# Patient Record
Sex: Male | Born: 1965
Health system: Southern US, Community
[De-identification: ages and names within clinical notes are randomized; demographics above are authoritative.]

## PROBLEM LIST (undated history)

## (undated) DIAGNOSIS — N390 Urinary tract infection, site not specified: Secondary | ICD-10-CM

## (undated) DIAGNOSIS — N200 Calculus of kidney: Secondary | ICD-10-CM

## (undated) DIAGNOSIS — I10 Essential (primary) hypertension: Secondary | ICD-10-CM

## (undated) DIAGNOSIS — R0602 Shortness of breath: Secondary | ICD-10-CM

## (undated) DIAGNOSIS — K439 Ventral hernia without obstruction or gangrene: Secondary | ICD-10-CM

## (undated) DIAGNOSIS — K219 Gastro-esophageal reflux disease without esophagitis: Secondary | ICD-10-CM

## (undated) DIAGNOSIS — C189 Malignant neoplasm of colon, unspecified: Secondary | ICD-10-CM

## (undated) DIAGNOSIS — C801 Malignant (primary) neoplasm, unspecified: Secondary | ICD-10-CM

## (undated) DIAGNOSIS — E119 Type 2 diabetes mellitus without complications: Secondary | ICD-10-CM

## (undated) DIAGNOSIS — E785 Hyperlipidemia, unspecified: Secondary | ICD-10-CM

## (undated) HISTORY — DX: Ventral hernia without obstruction or gangrene: K43.9

## (undated) HISTORY — PX: HERNIA REPAIR: SHX51

## (undated) HISTORY — DX: Gastro-esophageal reflux disease without esophagitis: K21.9

## (undated) HISTORY — DX: Essential (primary) hypertension: I10

## (undated) HISTORY — DX: Urinary tract infection, site not specified: N39.0

## (undated) HISTORY — DX: Malignant neoplasm of colon, unspecified: C18.9

## (undated) HISTORY — DX: Hyperlipidemia, unspecified: E78.5

## (undated) HISTORY — PX: CHOLECYSTECTOMY: SHX55

## (undated) HISTORY — DX: Malignant (primary) neoplasm, unspecified: C80.1

---

## 1997-09-09 HISTORY — PX: KIDNEY STONE SURGERY: SHX686

## 2000-01-27 ENCOUNTER — Emergency Department (HOSPITAL_COMMUNITY): Admission: EM | Admit: 2000-01-27 | Discharge: 2000-01-27 | Payer: Self-pay | Admitting: Emergency Medicine

## 2000-06-21 ENCOUNTER — Emergency Department (HOSPITAL_COMMUNITY): Admission: EM | Admit: 2000-06-21 | Discharge: 2000-06-21 | Payer: Self-pay | Admitting: Emergency Medicine

## 2000-06-21 ENCOUNTER — Encounter: Payer: Self-pay | Admitting: Emergency Medicine

## 2000-07-27 ENCOUNTER — Emergency Department (HOSPITAL_COMMUNITY): Admission: EM | Admit: 2000-07-27 | Discharge: 2000-07-27 | Payer: Self-pay | Admitting: Emergency Medicine

## 2000-08-24 ENCOUNTER — Encounter: Payer: Self-pay | Admitting: Emergency Medicine

## 2000-08-24 ENCOUNTER — Emergency Department (HOSPITAL_COMMUNITY): Admission: EM | Admit: 2000-08-24 | Discharge: 2000-08-24 | Payer: Self-pay | Admitting: Emergency Medicine

## 2000-09-25 ENCOUNTER — Emergency Department (HOSPITAL_COMMUNITY): Admission: EM | Admit: 2000-09-25 | Discharge: 2000-09-25 | Payer: Self-pay | Admitting: Emergency Medicine

## 2001-02-08 ENCOUNTER — Emergency Department (HOSPITAL_COMMUNITY): Admission: EM | Admit: 2001-02-08 | Discharge: 2001-02-08 | Payer: Self-pay | Admitting: *Deleted

## 2001-12-19 ENCOUNTER — Encounter: Payer: Self-pay | Admitting: Emergency Medicine

## 2001-12-19 ENCOUNTER — Emergency Department (HOSPITAL_COMMUNITY): Admission: EM | Admit: 2001-12-19 | Discharge: 2001-12-19 | Payer: Self-pay | Admitting: Emergency Medicine

## 2002-05-31 ENCOUNTER — Emergency Department (HOSPITAL_COMMUNITY): Admission: EM | Admit: 2002-05-31 | Discharge: 2002-05-31 | Payer: Self-pay | Admitting: Emergency Medicine

## 2003-11-06 ENCOUNTER — Emergency Department (HOSPITAL_COMMUNITY): Admission: AD | Admit: 2003-11-06 | Discharge: 2003-11-06 | Payer: Self-pay | Admitting: Family Medicine

## 2003-11-07 ENCOUNTER — Emergency Department (HOSPITAL_COMMUNITY): Admission: EM | Admit: 2003-11-07 | Discharge: 2003-11-07 | Payer: Self-pay | Admitting: Family Medicine

## 2004-07-23 ENCOUNTER — Ambulatory Visit: Payer: Self-pay | Admitting: Family Medicine

## 2004-11-16 ENCOUNTER — Ambulatory Visit: Payer: Self-pay | Admitting: Internal Medicine

## 2004-11-24 ENCOUNTER — Emergency Department (HOSPITAL_COMMUNITY): Admission: EM | Admit: 2004-11-24 | Discharge: 2004-11-24 | Payer: Self-pay | Admitting: Emergency Medicine

## 2004-12-07 ENCOUNTER — Ambulatory Visit: Payer: Self-pay | Admitting: Internal Medicine

## 2005-01-10 ENCOUNTER — Ambulatory Visit: Payer: Self-pay | Admitting: Family Medicine

## 2005-02-11 ENCOUNTER — Ambulatory Visit: Payer: Self-pay | Admitting: Family Medicine

## 2005-04-22 ENCOUNTER — Ambulatory Visit: Payer: Self-pay | Admitting: Family Medicine

## 2005-05-11 ENCOUNTER — Emergency Department (HOSPITAL_COMMUNITY): Admission: EM | Admit: 2005-05-11 | Discharge: 2005-05-11 | Payer: Self-pay | Admitting: Family Medicine

## 2005-06-11 ENCOUNTER — Ambulatory Visit: Payer: Self-pay | Admitting: Family Medicine

## 2005-07-16 ENCOUNTER — Ambulatory Visit: Payer: Self-pay | Admitting: Internal Medicine

## 2005-08-05 ENCOUNTER — Ambulatory Visit: Payer: Self-pay | Admitting: Family Medicine

## 2005-08-21 ENCOUNTER — Ambulatory Visit: Payer: Self-pay | Admitting: Family Medicine

## 2005-10-29 ENCOUNTER — Ambulatory Visit: Payer: Self-pay | Admitting: Family Medicine

## 2005-12-30 ENCOUNTER — Ambulatory Visit: Payer: Self-pay | Admitting: Family Medicine

## 2006-02-13 ENCOUNTER — Ambulatory Visit: Payer: Self-pay | Admitting: Family Medicine

## 2006-03-21 ENCOUNTER — Emergency Department (HOSPITAL_COMMUNITY): Admission: EM | Admit: 2006-03-21 | Discharge: 2006-03-21 | Payer: Self-pay | Admitting: Emergency Medicine

## 2006-03-24 ENCOUNTER — Ambulatory Visit: Payer: Self-pay | Admitting: Internal Medicine

## 2006-04-12 ENCOUNTER — Emergency Department (HOSPITAL_COMMUNITY): Admission: EM | Admit: 2006-04-12 | Discharge: 2006-04-12 | Payer: Self-pay | Admitting: Emergency Medicine

## 2006-06-11 ENCOUNTER — Ambulatory Visit: Payer: Self-pay | Admitting: Family Medicine

## 2006-07-30 ENCOUNTER — Ambulatory Visit: Payer: Self-pay | Admitting: Family Medicine

## 2006-09-16 ENCOUNTER — Ambulatory Visit: Payer: Self-pay | Admitting: Family Medicine

## 2006-11-23 ENCOUNTER — Emergency Department (HOSPITAL_COMMUNITY): Admission: EM | Admit: 2006-11-23 | Discharge: 2006-11-23 | Payer: Self-pay | Admitting: Emergency Medicine

## 2006-12-23 ENCOUNTER — Ambulatory Visit: Payer: Self-pay | Admitting: Family Medicine

## 2007-02-18 ENCOUNTER — Ambulatory Visit: Payer: Self-pay | Admitting: Family Medicine

## 2007-04-28 ENCOUNTER — Ambulatory Visit: Payer: Self-pay | Admitting: Family Medicine

## 2007-04-29 ENCOUNTER — Encounter (INDEPENDENT_AMBULATORY_CARE_PROVIDER_SITE_OTHER): Payer: Self-pay | Admitting: Family Medicine

## 2007-04-29 LAB — CONVERTED CEMR LAB
ALT: 33 units/L (ref 0–53)
AST: 22 units/L (ref 0–37)
Albumin: 4.4 g/dL (ref 3.5–5.2)
Alkaline Phosphatase: 69 units/L (ref 39–117)
BUN: 8 mg/dL (ref 6–23)
CO2: 23 meq/L (ref 19–32)
Chloride: 101 meq/L (ref 96–112)
Creatinine, Ser: 0.68 mg/dL (ref 0.40–1.50)
Glucose, Bld: 185 mg/dL — ABNORMAL HIGH (ref 70–99)
LDL Cholesterol: 72 mg/dL (ref 0–99)
Potassium: 4.3 meq/L (ref 3.5–5.3)
Sodium: 140 meq/L (ref 135–145)
Total CHOL/HDL Ratio: 4.8
Total Protein: 7.4 g/dL (ref 6.0–8.3)
VLDL: 38 mg/dL (ref 0–40)

## 2007-05-18 DIAGNOSIS — I10 Essential (primary) hypertension: Secondary | ICD-10-CM | POA: Insufficient documentation

## 2007-05-18 DIAGNOSIS — E1169 Type 2 diabetes mellitus with other specified complication: Secondary | ICD-10-CM | POA: Insufficient documentation

## 2007-05-18 DIAGNOSIS — E785 Hyperlipidemia, unspecified: Secondary | ICD-10-CM | POA: Insufficient documentation

## 2007-05-18 DIAGNOSIS — F911 Conduct disorder, childhood-onset type: Secondary | ICD-10-CM | POA: Insufficient documentation

## 2007-06-16 ENCOUNTER — Ambulatory Visit: Payer: Self-pay | Admitting: Family Medicine

## 2007-06-16 ENCOUNTER — Encounter (INDEPENDENT_AMBULATORY_CARE_PROVIDER_SITE_OTHER): Payer: Self-pay | Admitting: Internal Medicine

## 2007-07-15 ENCOUNTER — Telehealth (INDEPENDENT_AMBULATORY_CARE_PROVIDER_SITE_OTHER): Payer: Self-pay | Admitting: *Deleted

## 2007-08-10 ENCOUNTER — Telehealth (INDEPENDENT_AMBULATORY_CARE_PROVIDER_SITE_OTHER): Payer: Self-pay | Admitting: Family Medicine

## 2007-08-17 ENCOUNTER — Ambulatory Visit: Payer: Self-pay | Admitting: Family Medicine

## 2007-08-17 LAB — CONVERTED CEMR LAB
Albumin: 4.2 g/dL (ref 3.5–5.2)
Basophils Relative: 0 % (ref 0–1)
Creatinine, Ser: 0.71 mg/dL (ref 0.40–1.50)
Eosinophils Absolute: 0.1 10*3/uL — ABNORMAL LOW (ref 0.2–0.7)
HDL: 27 mg/dL — ABNORMAL LOW (ref >39)
Lymphocytes Relative: 44 % (ref 12–46)
Lymphs Abs: 2.4 10*3/uL (ref 0.7–4.0)
MCHC: 34.7 g/dL (ref 30.0–36.0)
MCV: 95.5 fL (ref 78.0–100.0)
Monocytes Relative: 5 % (ref 3–12)
Neutrophils Relative %: 48 % (ref 43–77)
RBC: 4.49 M/uL (ref 4.22–5.81)
RDW: 13 % (ref 11.5–15.5)
Total Protein: 7.3 g/dL (ref 6.0–8.3)
Triglycerides: 156 mg/dL — ABNORMAL HIGH (ref <150)

## 2007-09-08 ENCOUNTER — Telehealth (INDEPENDENT_AMBULATORY_CARE_PROVIDER_SITE_OTHER): Payer: Self-pay | Admitting: Family Medicine

## 2007-12-08 ENCOUNTER — Ambulatory Visit: Payer: Self-pay | Admitting: Family Medicine

## 2007-12-08 LAB — CONVERTED CEMR LAB: Blood Glucose, Fingerstick: 184

## 2008-03-06 ENCOUNTER — Emergency Department (HOSPITAL_COMMUNITY): Admission: EM | Admit: 2008-03-06 | Discharge: 2008-03-06 | Payer: Self-pay | Admitting: Emergency Medicine

## 2008-03-14 ENCOUNTER — Emergency Department (HOSPITAL_COMMUNITY): Admission: EM | Admit: 2008-03-14 | Discharge: 2008-03-14 | Payer: Self-pay | Admitting: Emergency Medicine

## 2008-03-16 ENCOUNTER — Ambulatory Visit: Payer: Self-pay | Admitting: Family Medicine

## 2008-03-16 DIAGNOSIS — IMO0002 Reserved for concepts with insufficient information to code with codable children: Secondary | ICD-10-CM

## 2008-03-16 LAB — CONVERTED CEMR LAB: Hgb A1c MFr Bld: 8 %

## 2008-04-08 ENCOUNTER — Emergency Department (HOSPITAL_COMMUNITY): Admission: EM | Admit: 2008-04-08 | Discharge: 2008-04-08 | Payer: Self-pay | Admitting: Emergency Medicine

## 2008-05-17 ENCOUNTER — Ambulatory Visit: Payer: Self-pay | Admitting: Family Medicine

## 2008-07-10 HISTORY — PX: COLECTOMY: SHX59

## 2008-07-18 ENCOUNTER — Inpatient Hospital Stay (HOSPITAL_COMMUNITY): Admission: EM | Admit: 2008-07-18 | Discharge: 2008-07-29 | Payer: Self-pay | Admitting: Family Medicine

## 2008-07-19 ENCOUNTER — Encounter (INDEPENDENT_AMBULATORY_CARE_PROVIDER_SITE_OTHER): Payer: Self-pay | Admitting: Family Medicine

## 2008-07-20 ENCOUNTER — Encounter (INDEPENDENT_AMBULATORY_CARE_PROVIDER_SITE_OTHER): Payer: Self-pay | Admitting: Gastroenterology

## 2008-07-22 ENCOUNTER — Encounter (INDEPENDENT_AMBULATORY_CARE_PROVIDER_SITE_OTHER): Payer: Self-pay | Admitting: General Surgery

## 2008-07-22 DIAGNOSIS — C7B8 Other secondary neuroendocrine tumors: Secondary | ICD-10-CM | POA: Insufficient documentation

## 2008-07-25 ENCOUNTER — Encounter (INDEPENDENT_AMBULATORY_CARE_PROVIDER_SITE_OTHER): Payer: Self-pay | Admitting: Family Medicine

## 2008-07-25 LAB — CONVERTED CEMR LAB
ALT: 30 units/L (ref 0–53)
AST: 17 units/L (ref 0–37)
Albumin: 4.2 g/dL (ref 3.5–5.2)
Alkaline Phosphatase: 82 units/L (ref 39–117)
BUN: 6 mg/dL (ref 6–23)
Chloride: 101 meq/L (ref 96–112)
Cholesterol: 239 mg/dL — ABNORMAL HIGH (ref 0–200)
Creatinine, Ser: 0.66 mg/dL (ref 0.40–1.50)
Glucose, Bld: 253 mg/dL — ABNORMAL HIGH (ref 70–99)
HDL: 29 mg/dL — ABNORMAL LOW (ref >39)
Potassium: 4.1 meq/L (ref 3.5–5.3)
Sodium: 138 meq/L (ref 135–145)
Total Protein: 7.4 g/dL (ref 6.0–8.3)
Triglycerides: 155 mg/dL — ABNORMAL HIGH (ref <150)

## 2008-07-27 ENCOUNTER — Ambulatory Visit: Payer: Self-pay | Admitting: Oncology

## 2008-07-28 ENCOUNTER — Telehealth (INDEPENDENT_AMBULATORY_CARE_PROVIDER_SITE_OTHER): Payer: Self-pay | Admitting: Family Medicine

## 2008-07-31 ENCOUNTER — Emergency Department (HOSPITAL_COMMUNITY): Admission: EM | Admit: 2008-07-31 | Discharge: 2008-07-31 | Payer: Self-pay | Admitting: Emergency Medicine

## 2008-08-02 ENCOUNTER — Inpatient Hospital Stay (HOSPITAL_COMMUNITY): Admission: AD | Admit: 2008-08-02 | Discharge: 2008-08-06 | Payer: Self-pay

## 2008-08-11 LAB — CMP (CANCER CENTER ONLY)
AST: 30 U/L (ref 11–38)
Alkaline Phosphatase: 86 U/L — ABNORMAL HIGH (ref 26–84)
BUN, Bld: 6 mg/dL — ABNORMAL LOW (ref 7–22)
Calcium: 9 mg/dL (ref 8.0–10.3)
Chloride: 102 mEq/L (ref 98–108)
Creat: 1.1 mg/dl (ref 0.6–1.2)
Total Bilirubin: 0.7 mg/dl (ref 0.20–1.60)

## 2008-08-11 LAB — CBC WITH DIFFERENTIAL (CANCER CENTER ONLY)
Eosinophils Absolute: 0.3 10*3/uL (ref 0.0–0.5)
HCT: 39.9 % (ref 38.7–49.9)
LYMPH%: 30.7 % (ref 14.0–48.0)
MCV: 93 fL (ref 82–98)
MONO#: 0.4 10*3/uL (ref 0.1–0.9)
NEUT%: 58.7 % (ref 40.0–80.0)
RBC: 4.27 10*6/uL (ref 4.20–5.70)
WBC: 7.2 10*3/uL (ref 4.0–10.0)

## 2008-08-16 ENCOUNTER — Ambulatory Visit: Payer: Self-pay | Admitting: Family Medicine

## 2008-08-16 LAB — CONVERTED CEMR LAB
Blood Glucose, Fingerstick: 170
Hgb A1c MFr Bld: 7.5 %

## 2008-09-07 ENCOUNTER — Encounter (INDEPENDENT_AMBULATORY_CARE_PROVIDER_SITE_OTHER): Payer: Self-pay | Admitting: Family Medicine

## 2008-09-14 ENCOUNTER — Ambulatory Visit: Payer: Self-pay | Admitting: Family Medicine

## 2008-09-14 DIAGNOSIS — B353 Tinea pedis: Secondary | ICD-10-CM

## 2008-09-22 ENCOUNTER — Ambulatory Visit: Payer: Self-pay | Admitting: Oncology

## 2008-09-27 ENCOUNTER — Encounter (INDEPENDENT_AMBULATORY_CARE_PROVIDER_SITE_OTHER): Payer: Self-pay | Admitting: Family Medicine

## 2008-09-27 LAB — CMP (CANCER CENTER ONLY)
ALT(SGPT): 17 U/L (ref 10–47)
AST: 21 U/L (ref 11–38)
Albumin: 3.5 g/dL (ref 3.3–5.5)
Alkaline Phosphatase: 64 U/L (ref 26–84)
Potassium: 3.6 mEq/L (ref 3.3–4.7)
Sodium: 135 mEq/L (ref 128–145)
Total Protein: 7.2 g/dL (ref 6.4–8.1)

## 2008-09-27 LAB — CBC WITH DIFFERENTIAL (CANCER CENTER ONLY)
BASO%: 0.7 % (ref 0.0–2.0)
EOS%: 1.8 % (ref 0.0–7.0)
HCT: 42.9 % (ref 38.7–49.9)
LYMPH%: 23.7 % (ref 14.0–48.0)
MCHC: 33.1 g/dL (ref 32.0–35.9)
MCV: 92 fL (ref 82–98)
NEUT%: 67.4 % (ref 40.0–80.0)
RDW: 12 % (ref 10.5–14.6)

## 2008-10-21 ENCOUNTER — Ambulatory Visit: Payer: Self-pay | Admitting: Internal Medicine

## 2008-10-21 DIAGNOSIS — N390 Urinary tract infection, site not specified: Secondary | ICD-10-CM

## 2008-10-21 LAB — CONVERTED CEMR LAB
Glucose, Urine, Semiquant: NEGATIVE
Hgb A1c MFr Bld: 7.3 %
Nitrite: NEGATIVE
Protein, U semiquant: NEGATIVE
pH: 6

## 2008-10-22 ENCOUNTER — Encounter (INDEPENDENT_AMBULATORY_CARE_PROVIDER_SITE_OTHER): Payer: Self-pay | Admitting: Internal Medicine

## 2008-10-24 ENCOUNTER — Ambulatory Visit: Payer: Self-pay | Admitting: Family Medicine

## 2008-10-24 LAB — CONVERTED CEMR LAB
Blood Glucose, Fingerstick: 161
Blood in Urine, dipstick: NEGATIVE
Glucose, Urine, Semiquant: NEGATIVE
Ketones, urine, test strip: NEGATIVE
Nitrite: NEGATIVE
Urobilinogen, UA: 0.2
pH: 5

## 2008-11-13 ENCOUNTER — Telehealth (INDEPENDENT_AMBULATORY_CARE_PROVIDER_SITE_OTHER): Payer: Self-pay | Admitting: Family Medicine

## 2008-11-14 ENCOUNTER — Ambulatory Visit: Payer: Self-pay | Admitting: Family Medicine

## 2008-12-23 ENCOUNTER — Ambulatory Visit: Payer: Self-pay | Admitting: Internal Medicine

## 2008-12-23 DIAGNOSIS — K432 Incisional hernia without obstruction or gangrene: Secondary | ICD-10-CM | POA: Insufficient documentation

## 2009-01-03 ENCOUNTER — Emergency Department (HOSPITAL_COMMUNITY): Admission: EM | Admit: 2009-01-03 | Discharge: 2009-01-03 | Payer: Self-pay | Admitting: Emergency Medicine

## 2009-01-06 ENCOUNTER — Encounter (INDEPENDENT_AMBULATORY_CARE_PROVIDER_SITE_OTHER): Payer: Self-pay | Admitting: Internal Medicine

## 2009-01-13 ENCOUNTER — Emergency Department (HOSPITAL_COMMUNITY): Admission: EM | Admit: 2009-01-13 | Discharge: 2009-01-13 | Payer: Self-pay | Admitting: Emergency Medicine

## 2009-01-25 ENCOUNTER — Telehealth (INDEPENDENT_AMBULATORY_CARE_PROVIDER_SITE_OTHER): Payer: Self-pay | Admitting: Family Medicine

## 2009-01-27 ENCOUNTER — Ambulatory Visit: Payer: Self-pay | Admitting: Family Medicine

## 2009-01-31 ENCOUNTER — Encounter (INDEPENDENT_AMBULATORY_CARE_PROVIDER_SITE_OTHER): Payer: Self-pay | Admitting: Family Medicine

## 2009-01-31 LAB — CONVERTED CEMR LAB
Albumin: 4.1 g/dL (ref 3.5–5.2)
Alkaline Phosphatase: 76 units/L (ref 39–117)
BUN: 8 mg/dL (ref 6–23)
CO2: 24 meq/L (ref 19–32)
Cholesterol: 127 mg/dL (ref 0–200)
Glucose, Bld: 134 mg/dL — ABNORMAL HIGH (ref 70–99)
Potassium: 4.4 meq/L (ref 3.5–5.3)
Triglycerides: 213 mg/dL — ABNORMAL HIGH (ref <150)
VLDL: 43 mg/dL — ABNORMAL HIGH (ref 0–40)

## 2009-02-10 ENCOUNTER — Encounter (INDEPENDENT_AMBULATORY_CARE_PROVIDER_SITE_OTHER): Payer: Self-pay | Admitting: Family Medicine

## 2009-02-13 ENCOUNTER — Inpatient Hospital Stay (HOSPITAL_COMMUNITY): Admission: RE | Admit: 2009-02-13 | Discharge: 2009-02-14 | Payer: Self-pay | Admitting: General Surgery

## 2009-02-14 ENCOUNTER — Encounter: Payer: Self-pay | Admitting: Physician Assistant

## 2009-02-24 ENCOUNTER — Encounter (INDEPENDENT_AMBULATORY_CARE_PROVIDER_SITE_OTHER): Payer: Self-pay | Admitting: Internal Medicine

## 2009-03-14 ENCOUNTER — Encounter: Payer: Self-pay | Admitting: Physician Assistant

## 2009-04-05 ENCOUNTER — Telehealth (INDEPENDENT_AMBULATORY_CARE_PROVIDER_SITE_OTHER): Payer: Self-pay | Admitting: Family Medicine

## 2009-04-05 ENCOUNTER — Emergency Department (HOSPITAL_COMMUNITY): Admission: EM | Admit: 2009-04-05 | Discharge: 2009-04-05 | Payer: Self-pay | Admitting: Emergency Medicine

## 2009-04-17 ENCOUNTER — Encounter (INDEPENDENT_AMBULATORY_CARE_PROVIDER_SITE_OTHER): Payer: Self-pay | Admitting: *Deleted

## 2009-04-17 ENCOUNTER — Ambulatory Visit: Payer: Self-pay | Admitting: Oncology

## 2009-04-24 LAB — CBC WITH DIFFERENTIAL (CANCER CENTER ONLY)
Eosinophils Absolute: 0.1 10*3/uL (ref 0.0–0.5)
LYMPH%: 46.9 % (ref 14.0–48.0)
MCV: 91 fL (ref 82–98)
MONO#: 0.2 10*3/uL (ref 0.1–0.9)
NEUT#: 1.4 10*3/uL — ABNORMAL LOW (ref 1.5–6.5)
Platelets: 204 10*3/uL (ref 145–400)
RBC: 4.37 10*6/uL (ref 4.20–5.70)
WBC: 3.2 10*3/uL — ABNORMAL LOW (ref 4.0–10.0)

## 2009-04-24 LAB — CMP (CANCER CENTER ONLY)
ALT(SGPT): 27 U/L (ref 10–47)
AST: 28 U/L (ref 11–38)
Calcium: 9 mg/dL (ref 8.0–10.3)
Chloride: 99 mEq/L (ref 98–108)
Creat: 0.6 mg/dl (ref 0.6–1.2)
Potassium: 3.5 mEq/L (ref 3.3–4.7)

## 2009-04-24 LAB — LACTATE DEHYDROGENASE: LDH: 120 U/L (ref 94–250)

## 2009-04-25 ENCOUNTER — Encounter: Payer: Self-pay | Admitting: Physician Assistant

## 2009-04-27 ENCOUNTER — Encounter: Payer: Self-pay | Admitting: Physician Assistant

## 2009-04-28 ENCOUNTER — Telehealth: Payer: Self-pay | Admitting: Physician Assistant

## 2009-04-28 LAB — 5 HIAA, QUANTITATIVE, URINE, 24 HOUR
5-HIAA, 24 Hr Urine: 1.1 mg/24 h (ref <=6.0)
Volume, Urine-5HIAA: 550 mL/24 h

## 2009-05-01 ENCOUNTER — Encounter: Payer: Self-pay | Admitting: Physician Assistant

## 2009-05-03 ENCOUNTER — Telehealth (INDEPENDENT_AMBULATORY_CARE_PROVIDER_SITE_OTHER): Payer: Self-pay | Admitting: Internal Medicine

## 2009-05-05 ENCOUNTER — Encounter (INDEPENDENT_AMBULATORY_CARE_PROVIDER_SITE_OTHER): Payer: Self-pay | Admitting: Internal Medicine

## 2009-05-08 ENCOUNTER — Encounter (INDEPENDENT_AMBULATORY_CARE_PROVIDER_SITE_OTHER): Payer: Self-pay | Admitting: Internal Medicine

## 2009-05-19 ENCOUNTER — Ambulatory Visit: Payer: Self-pay | Admitting: Physician Assistant

## 2009-05-19 DIAGNOSIS — R109 Unspecified abdominal pain: Secondary | ICD-10-CM | POA: Insufficient documentation

## 2009-05-19 DIAGNOSIS — Z87448 Personal history of other diseases of urinary system: Secondary | ICD-10-CM | POA: Insufficient documentation

## 2009-05-19 LAB — CONVERTED CEMR LAB: Blood Glucose, Fingerstick: 214

## 2009-05-22 ENCOUNTER — Encounter: Payer: Self-pay | Admitting: Physician Assistant

## 2009-05-22 LAB — CONVERTED CEMR LAB
ALT: 17 units/L (ref 0–53)
Albumin: 4.3 g/dL (ref 3.5–5.2)
Amylase: 23 units/L (ref 0–105)
BUN: 8 mg/dL (ref 6–23)
Chloride: 103 meq/L (ref 96–112)
Eosinophils Absolute: 0.2 10*3/uL (ref 0.0–0.7)
Eosinophils Relative: 4 % (ref 0–5)
HCT: 46.9 % (ref 39.0–52.0)
Hemoglobin: 15.7 g/dL (ref 13.0–17.0)
MCV: 94.6 fL (ref 78.0–100.0)
Neutrophils Relative %: 46 % (ref 43–77)
Platelets: 177 10*3/uL (ref 150–400)
Sodium: 138 meq/L (ref 135–145)
Total Bilirubin: 0.4 mg/dL (ref 0.3–1.2)
WBC: 5.2 10*3/uL (ref 4.0–10.5)

## 2009-05-31 DIAGNOSIS — S99919A Unspecified injury of unspecified ankle, initial encounter: Secondary | ICD-10-CM

## 2009-05-31 DIAGNOSIS — S99929A Unspecified injury of unspecified foot, initial encounter: Secondary | ICD-10-CM

## 2009-05-31 DIAGNOSIS — S8990XA Unspecified injury of unspecified lower leg, initial encounter: Secondary | ICD-10-CM | POA: Insufficient documentation

## 2009-06-02 ENCOUNTER — Ambulatory Visit: Payer: Self-pay | Admitting: Physician Assistant

## 2009-06-05 ENCOUNTER — Encounter: Payer: Self-pay | Admitting: Physician Assistant

## 2009-06-05 ENCOUNTER — Ambulatory Visit (HOSPITAL_COMMUNITY): Admission: RE | Admit: 2009-06-05 | Discharge: 2009-06-05 | Payer: Self-pay | Admitting: Physician Assistant

## 2009-06-06 ENCOUNTER — Telehealth: Payer: Self-pay | Admitting: Physician Assistant

## 2009-09-07 ENCOUNTER — Telehealth: Payer: Self-pay | Admitting: Physician Assistant

## 2009-09-07 ENCOUNTER — Ambulatory Visit: Payer: Self-pay | Admitting: Physician Assistant

## 2009-09-07 DIAGNOSIS — K219 Gastro-esophageal reflux disease without esophagitis: Secondary | ICD-10-CM

## 2009-09-07 LAB — CONVERTED CEMR LAB
Bilirubin Urine: NEGATIVE
Blood in Urine, dipstick: NEGATIVE
Glucose, Urine, Semiquant: >=1000
Nitrite: NEGATIVE
Specific Gravity, Urine: 1.025
Urobilinogen, UA: 0.2
WBC Urine, dipstick: NEGATIVE

## 2009-09-12 ENCOUNTER — Encounter: Payer: Self-pay | Admitting: Physician Assistant

## 2009-09-12 LAB — CONVERTED CEMR LAB
ALT: 47 units/L (ref 0–53)
AST: 35 units/L (ref 0–37)
Albumin: 4.3 g/dL (ref 3.5–5.2)
BUN: 7 mg/dL (ref 6–23)
Chloride: 99 meq/L (ref 96–112)
Glucose, Bld: 286 mg/dL — ABNORMAL HIGH (ref 70–99)
Potassium: 3.9 meq/L (ref 3.5–5.3)
Total Bilirubin: 0.7 mg/dL (ref 0.3–1.2)
Total Protein: 7.2 g/dL (ref 6.0–8.3)

## 2009-09-15 ENCOUNTER — Telehealth: Payer: Self-pay | Admitting: Physician Assistant

## 2009-10-17 ENCOUNTER — Ambulatory Visit: Payer: Self-pay | Admitting: Physician Assistant

## 2009-10-23 ENCOUNTER — Ambulatory Visit: Payer: Self-pay | Admitting: Oncology

## 2009-10-26 LAB — CBC WITH DIFFERENTIAL (CANCER CENTER ONLY)
BASO#: 0 10*3/uL (ref 0.0–0.2)
EOS%: 4.5 % (ref 0.0–7.0)
HCT: 46.5 % (ref 38.7–49.9)
HGB: 15.7 g/dL (ref 13.0–17.1)
LYMPH#: 1.9 10*3/uL (ref 0.9–3.3)
MCH: 34.1 pg — ABNORMAL HIGH (ref 28.0–33.4)
MCHC: 33.7 g/dL (ref 32.0–35.9)
MCV: 101 fL — ABNORMAL HIGH (ref 82–98)
NEUT%: 44 % (ref 40.0–80.0)

## 2009-10-26 LAB — CMP (CANCER CENTER ONLY)
AST: 32 U/L (ref 11–38)
BUN, Bld: 10 mg/dL (ref 7–22)
Calcium: 9 mg/dL (ref 8.0–10.3)
Chloride: 100 mEq/L (ref 98–108)
Creat: 0.7 mg/dl (ref 0.6–1.2)

## 2009-10-26 LAB — LACTATE DEHYDROGENASE: LDH: 127 U/L (ref 94–250)

## 2009-11-02 LAB — 5 HIAA, QUANTITATIVE, URINE, 24 HOUR: Volume, Urine-5HIAA: 800 mL/24 h

## 2009-11-07 ENCOUNTER — Ambulatory Visit: Payer: Self-pay | Admitting: Physician Assistant

## 2009-11-09 LAB — CONVERTED CEMR LAB
HDL: 26 mg/dL — ABNORMAL LOW (ref >39)
Total CHOL/HDL Ratio: 8.5
Triglycerides: 258 mg/dL — ABNORMAL HIGH (ref <150)
VLDL: 52 mg/dL — ABNORMAL HIGH (ref 0–40)

## 2009-11-14 ENCOUNTER — Ambulatory Visit: Payer: Self-pay | Admitting: Physician Assistant

## 2010-03-19 ENCOUNTER — Ambulatory Visit: Payer: Self-pay | Admitting: Physician Assistant

## 2010-03-19 DIAGNOSIS — S6390XA Sprain of unspecified part of unspecified wrist and hand, initial encounter: Secondary | ICD-10-CM | POA: Insufficient documentation

## 2010-03-20 LAB — CONVERTED CEMR LAB
BUN: 7 mg/dL (ref 6–23)
CO2: 20 meq/L (ref 19–32)
Calcium: 10.1 mg/dL (ref 8.4–10.5)
Chloride: 100 meq/L (ref 96–112)
Creatinine, Ser: 0.75 mg/dL (ref 0.40–1.50)
Glucose, Bld: 315 mg/dL — ABNORMAL HIGH (ref 70–99)
Hgb A1c MFr Bld: 9.4 % — ABNORMAL HIGH (ref <5.7)
Total Bilirubin: 0.6 mg/dL (ref 0.3–1.2)

## 2010-03-27 ENCOUNTER — Ambulatory Visit: Payer: Self-pay | Admitting: Physician Assistant

## 2010-03-27 ENCOUNTER — Encounter: Payer: Self-pay | Admitting: Physician Assistant

## 2010-04-04 ENCOUNTER — Ambulatory Visit: Payer: Self-pay | Admitting: Physician Assistant

## 2010-04-05 LAB — CONVERTED CEMR LAB
Cholesterol: 104 mg/dL (ref 0–200)
HDL: 29 mg/dL — ABNORMAL LOW (ref >39)
Triglycerides: 125 mg/dL (ref <150)

## 2010-04-13 ENCOUNTER — Ambulatory Visit: Payer: Self-pay | Admitting: Oncology

## 2010-05-18 ENCOUNTER — Emergency Department (HOSPITAL_COMMUNITY)
Admission: EM | Admit: 2010-05-18 | Discharge: 2010-05-18 | Payer: Self-pay | Source: Home / Self Care | Admitting: Emergency Medicine

## 2010-08-14 ENCOUNTER — Ambulatory Visit: Payer: Self-pay | Admitting: Oncology

## 2010-08-15 LAB — CBC WITH DIFFERENTIAL/PLATELET
BASO%: 0.6 % (ref 0.0–2.0)
HCT: 42.6 % (ref 38.4–49.9)
MCHC: 35.4 g/dL (ref 32.0–36.0)
MONO#: 0.3 10*3/uL (ref 0.1–0.9)
RBC: 4.26 10*6/uL (ref 4.20–5.82)
RDW: 13.6 % (ref 11.0–14.6)
WBC: 4.9 10*3/uL (ref 4.0–10.3)
lymph#: 2.1 10*3/uL (ref 0.9–3.3)

## 2010-08-15 LAB — COMPREHENSIVE METABOLIC PANEL
ALT: 37 U/L (ref 0–53)
CO2: 26 mEq/L (ref 19–32)
Calcium: 9.6 mg/dL (ref 8.4–10.5)
Chloride: 106 mEq/L (ref 96–112)
Sodium: 141 mEq/L (ref 135–145)
Total Protein: 6.9 g/dL (ref 6.0–8.3)

## 2010-08-23 ENCOUNTER — Ambulatory Visit (HOSPITAL_COMMUNITY): Admission: RE | Admit: 2010-08-23 | Payer: Self-pay | Source: Home / Self Care | Admitting: Oncology

## 2010-09-04 ENCOUNTER — Ambulatory Visit (HOSPITAL_COMMUNITY)
Admission: RE | Admit: 2010-09-04 | Discharge: 2010-09-04 | Payer: Self-pay | Source: Home / Self Care | Attending: Oncology | Admitting: Oncology

## 2010-09-18 ENCOUNTER — Ambulatory Visit: Admit: 2010-09-18 | Payer: Self-pay | Admitting: Internal Medicine

## 2010-09-30 ENCOUNTER — Encounter: Payer: Self-pay | Admitting: Oncology

## 2010-10-11 NOTE — Letter (Signed)
Summary: NUTRITION SUMMARY  NUTRITION SUMMARY   Imported By: Arta Bruce 04/23/2010 14:57:31  _____________________________________________________________________  External Attachment:    Type:   Image     Comment:   External Document

## 2010-10-11 NOTE — Assessment & Plan Note (Signed)
Summary: follow up in 1 month with [name] for dm///gk   Vital Signs:  Patient profile:   45 year old male Height:      65.5 inches Weight:      257 pounds BMI:     42.27 Temp:     98.3 degrees F oral Pulse rate:   76 / minute Pulse rhythm:   regular Resp:     18 per minute BP sitting:   135 / 83  (left arm) Cuff size:   large  Vitals Entered By: Armenia Shannon (October 17, 2009 2:43 PM) CC: dm check up...[name] pt needs refills on meds..., Hypertension Management Is Patient Diabetic? Yes Pain Assessment Patient in pain? no      CBG Result 267  Does patient need assistance? Functional Status Self care Ambulation Normal   Primary Care Provider:  Tereso Newcomer PA-C  CC:  dm check up...[name] pt needs refills on meds... and Hypertension Management.  History of Present Illness: Here for f/u on DM. OUt of metformin.  Not clear as to how long he has been out.   Still taking lantus. Notes he checks his sugar at night when his mom gets home.  He has had most numbers in the mid 200 range.  He did have one reading of 500 2 nights ago.   No polyuria, polydipsia, polyphagia. No nausea or vomiting.  No stomach pain. Not taking PPI for GERD.  No recent symptoms of acid reflux.  No melena or hematochezia.    Hypertension History:      He denies chest pain, dyspnea with exertion, and syncope.  Further comments include: out of meds .        Positive major cardiovascular risk factors include diabetes, hyperlipidemia, and hypertension.  Negative major cardiovascular risk factors include male age less than 48 years old, negative family history for ischemic heart disease, and non-tobacco-user status.      Problems Prior to Update: 1)  Gerd  (ICD-530.81) 2)  Ankle Injury, Left  (ICD-959.7) 3)  Hematuria, Hx of  (ICD-V13.09) 4)  Abdominal Pain  (ICD-789.00) 5)  Ventral Hernia, Incisional  (ICD-553.21) 6)  Urinary Tract Infection, Acute  (ICD-599.0) 7)  Tinea Pedis  (ICD-110.4) 8)  Secondary  Neuroendocrine Tumor Unspecified Site  (ICD-209.70) 9)  Abrasion  (ICD-919.0) 10)  Anger  (ICD-312.00) 11)  Hypertension  (ICD-401.9) 12)  Hyperlipidemia  (ICD-272.4) 13)  Diabetes Mellitus, Type II  (ICD-250.00)  Allergies: No Known Drug Allergies  Past History:  Past Medical History: Last updated: 05/19/2009 Current Problems:  SECONDARY NEUROENDOCRINE TUMOR UNSPECIFIED SITE (ICD-209.70)...Nov.2009    carcinoid; s/p partial right colectomy Dr. Bertram Savin    followed by Dr. Welton Flakes (Cancer center in [place]) ABRASION (ICD-919.0) ANGER (ICD-312.00) HYPERTENSION (ICD-401.9) HYPERLIPIDEMIA (ICD-272.4) DIABETES MELLITUS, TYPE II (ICD-250.00)  Past Surgical History: Last updated: 02/24/2009 1.  Cholecystectomy; 1993 in [place] 2.  s/p right colon resection of ileocecal mass..07/26/2008. 3.  02/10/2009:  Laparoscopic Incisional Hernia Repair--Dr. Bertram Savin  Physical Exam  General:  alert, well-developed, and well-nourished.   Head:  normocephalic and atraumatic.   Neck:  supple.   Lungs:  normal breath sounds, no crackles, and no wheezes.   Heart:  normal rate, regular rhythm, and no murmur.   Abdomen:  soft, non-tender, normal bowel sounds, and no hepatomegaly.   Neurologic:  alert & oriented X3 and cranial nerves II-XII intact.   Psych:  normally interactive.     Impression & Recommendations:  Problem # 1:  DIABETES  MELLITUS, TYPE II (ICD-250.00) out of control due to noncompliance restart metformin increase Lantus to 15 units at bedtime close f/u  His updated medication list for this problem includes:    Lisinopril 10 Mg Tabs (Lisinopril) .[name]... Take 1 tab by mouth daily    Glucophage 500 Mg Tabs (Metformin hcl) .[name]... Take 1 tablet by mouth every 12 hours    Lantus Solostar 100 Unit/ml Soln (Insulin glargine) ..... Inject 15 unit at bedtime  Problem # 2:  HYPERLIPIDEMIA (ICD-272.4) schedule fasting lipids  His updated medication list for this problem  includes:    Crestor 20 Mg Tabs (Rosuvastatin calcium) ..... One tablet by mouth nightly for cholesterol  Problem # 3:  HYPERTENSION (ICD-401.9) up today 2/2 being out of meds  His updated medication list for this problem includes:    Lisinopril 10 Mg Tabs (Lisinopril) .[name]... Take 1 tab by mouth daily  Problem # 4:  GERD (ICD-530.81) fairly stable without PPI change to H2RA to use as needed  His updated medication list for this problem includes:    Pepcid 20 Mg Tabs (Famotidine) .[name]... Take 1 tablet by mouth two times a day as needed for indigestion  Complete Medication List: 1)  Crestor 20 Mg Tabs (Rosuvastatin calcium) .... One tablet by mouth nightly for cholesterol 2)  Lisinopril 10 Mg Tabs (Lisinopril) .... Take 1 tab by mouth daily 3)  Glucophage 500 Mg Tabs (Metformin hcl) .... Take 1 tablet by mouth every 12 hours 4)  Pepcid 20 Mg Tabs (Famotidine) .... Take 1 tablet by mouth two times a day as needed for indigestion 5)  Air Cast Ankle Strap Splint  .... Apply to left ankle while active for 2-4 weeks, until ankle pain improves 6)  Lantus Solostar 100 Unit/ml Soln (Insulin glargine) .... Inject 15 unit at bedtime 7)  Lantus Solostar Pen Needles  .... As directed  Hypertension Assessment/Plan:      The patient's hypertensive risk group is category C: Target organ damage and/or diabetes.  His calculated 10 year risk of coronary heart disease is 7 %.  Today's blood pressure is 135/83.  His blood pressure goal is < 130/80.  Patient Instructions: 1)  Record sugars every morning. 2)  Bring a list of your sugars to your next appointment. 3)  Restart the glucophage (metformin). 4)  Increase Lantus to 15 units at bedtime. 5)  Please schedule a follow-up appointment in 1 month with [name] for diabetes.  6)  Come in fasting one day before your next appointment for labs (Lipids; dx 272.4). Prescriptions: PEPCID 20 MG TABS (FAMOTIDINE) Take 1 tablet by mouth two times a day as needed for  indigestion  #60 x 3   Entered and Authorized by:   Tereso Newcomer PA-C   Signed by:   Tereso Newcomer PA-C on 10/17/2009   Method used:   Electronically to        [facility] [phone]* (retail)       [address]       [place], [place]  [address]       Ph: [phone]       Fax: [phone]   RxID:   [id number] GLUCOPHAGE 500 MG TABS (METFORMIN HCL) Take 1 tablet by mouth every 12 hours  #60 x 11   Entered and Authorized by:   Tereso Newcomer PA-C   Signed by:   Tereso Newcomer PA-C on 10/17/2009   Method used:   Electronically to  [facility] [address] [phone]* (retail)       [address]       [place], [place]  [address]       Ph: [phone]       Fax: [phone]   RxID:   [id number] LISINOPRIL 10 MG  TABS (LISINOPRIL) Take 1 tab by mouth daily  #30 x 6   Entered and Authorized by:   Tereso Newcomer PA-C   Signed by:   Tereso Newcomer PA-C on 10/17/2009   Method used:   Electronically to        [facility] [phone]* (retail)       [address]       [place], [place]  [address]       Ph: [phone]       Fax: [phone]   RxID:   [id number]   Appended Document: follow up in 1 month with Lorin Picket for dm///gk Patient: [name] Note: All result statuses are Final unless otherwise noted.  Tests: (1) Lipid Profile ([address])   Order Note: FASTING   Cholesterol          [H]  221 mg/dL                   6-237     ATP III Classification:           < 200        mg/dL        Desirable          200 - 239     mg/dL        Borderline High          >= 240        mg/dL        High         Triglyceride         [H]  258 mg/dL                   <628   HDL Cholesterol      [L]  26 mg/dL                    >31   Total Chol/HDL Ratio      8.5 Ratio  VLDL Cholesterol (Calc)                        [H]  52 mg/dL                    5-17  LDL Cholesterol (Calc)                        [H]  143 mg/dL                   6-16           Total Cholesterol/HDL Ratio:CHD Risk                             Coronary Heart Disease Risk Table                                            Men       Women  1/2 Average Risk              3.4        3.3                  Average Risk              5.0        4.4              2 X Average Risk              9.6        7.1              3 X Average Risk             23.4       11.0     Use the calculated Patient Ratio above and the CHD Risk table      to determine the patient's CHD Risk.     ATP III Classification (LDL):           < 100        mg/dL         Optimal          100 - 129     mg/dL         Near or Above Optimal          130 - 159     mg/dL         Borderline High          160 - 189     mg/dL         High           > 190        mg/dL         Very High        Note: An exclamation mark (!) indicates a result that was not dispersed into the flowsheet. Document Creation Date: 11/07/2009 11:53 PM _______________________________________________________________________  (1) Order result status: Final Collection or observation date-time: 11/07/2009 21:46 Requested date-time: 11/07/2009 08:57 Receipt date-time: 11/07/2009 21:46 Reported date-time: 11/07/2009 23:49 Referring Physician:   Ordering Physician:  Alben Spittle [id number]) Specimen Source:  Source: Lajean Silvius Order Number: [id number] Lab site: [facility]     [address], [address]     [place]  [place]  [address]   Signed by Tereso Newcomer PA-C on 11/09/2009 at 8:57 AM  ________________________________________________________________________ supposed to be on crestor 20 by labs, does not appear that he is taking has appt next week will d/w patient then and decide on +/- adjustment in Rx.    Signed by Tereso Newcomer PA-C on 11/09/2009 at 8:57 AM

## 2010-10-11 NOTE — Letter (Signed)
Summary: Discharge Summary  Discharge Summary   Imported By: Arta Bruce 01/30/2010 15:55:08  _____________________________________________________________________  External Attachment:    Type:   Image     Comment:   External Document

## 2010-10-11 NOTE — Progress Notes (Signed)
  Phone Note Outgoing Call   Summary of Call: got prior auth for his lantus lantus on preferred list please find out what lantus I need to write for  Initial call taken by: Brynda Rim,  September 15, 2009 5:41 PM  Follow-up for Phone Call        not understanding...Marland KitchenMarland Kitchen solostar it that what you wanted or you need to know something else.... Follow-up by: Armenia Shannon,  September 19, 2009 12:38 PM  Additional Follow-up for Phone Call Additional follow up Details #1::        please call medicaid to find out if we can get prior auth for his Lantus Solostar. it is on preferred list and not sure why getting PA request. Additional Follow-up by: Brynda Rim,  September 19, 2009 2:08 PM    Additional Follow-up for Phone Call Additional follow up Details #2::    MS Derrington DAVIDS MOM STOPPED BY AND SAYS THAT Nathanal GOT THE LANTUS PENS, BUT THEY DIDNT GIVE HIM THE NEEDLES FOR HIS PENS. HE USES [facility] ON [address] NOW. Follow-up by: Leodis Rains,  September 20, 2009 12:25 PM  New/Updated Medications: * LANTUS SOLOSTAR PEN NEEDLES as directed Prescriptions: LANTUS SOLOSTAR PEN NEEDLES as directed  #1 mo supply x 11   Entered and Authorized by:   Tereso Newcomer PA-C   Signed by:   Tereso Newcomer PA-C on 09/20/2009   Method used:   Print then Give to Patient   RxID:   [id number]

## 2010-10-11 NOTE — Letter (Signed)
Summary: External Correspondence  External Correspondence   Imported By: Arta Bruce 01/30/2010 15:56:28  _____________________________________________________________________  External Attachment:    Type:   Image     Comment:   External Document

## 2010-10-11 NOTE — Letter (Signed)
Summary: *HSN Results Follow up  [facility]  [address] [place], [place] [address]   Phone: [phone]  Fax: [phone]      09/12/2009   Keithon Melchor Amour [address] [place], [place]  [address]   Dear  Mr. Brayten Monjaraz,                            ____S.Drinkard,FNP   ____D. Gore,FNP       ____B. McPherson,MD   ____V. Rankins,MD    ____E. Mulberry,MD    ____N. Daphine Deutscher, FNP  ____D. Reche Dixon, MD    ____K. Philipp Deputy, MD    __x__S. Alben Spittle, PA-C     This letter is to inform you that your recent test(s):  _______Pap Smear    ___x____Lab Test     _______X-ray    _______ is within acceptable limits  _______ requires a medication change  _______ requires a follow-up lab visit  _______ requires a follow-up visit with your provider   Comments:  Your labs looked ok except for your sugar, as expected.  We will see how your numbers do with the Lantus.       _________________________________________________________ If you have any questions, please contact our office                     Sincerely,  Tereso Newcomer PA-C [facility]

## 2010-10-11 NOTE — Assessment & Plan Note (Signed)
Summary: FU ON DM/////KT   Vital Signs:  Patient profile:   45 year old male Height:      65. inches Weight:      259 pounds BMI:     43.26 Temp:     97.5 degrees F oral Pulse rate:   80 / minute Pulse rhythm:   regular Resp:     18 per minute BP sitting:   117 / 77  (left arm) Cuff size:   large  Vitals Entered By: Armenia Shannon (March 19, 2010 10:25 AM) CC: f/u.... pt says wants to check his left hand middle finger, Hypertension Management Is Patient Diabetic? Yes Pain Assessment Patient in pain? no      CBG Result 287  Does patient need assistance? Functional Status Self care Ambulation Normal   Primary Care Provider:  Tereso Newcomer PA-C  CC:  f/u.... pt says wants to check his left hand middle finger and Hypertension Management.  History of Present Illness: Here for f/u on diabetes. Admits to compliance with meds.  Taking metformin 1000 mg two times a day and takes Lantus 30 units at bedtime.  Sugar 287 today.  Ate sausage and cheese biscuit.  Drinking regular pepsi in the office as well.  Has not seen dietician.  Admits to drinking sweetened tea most of the time.  Sugars ranging 120s.  States he is checking before he eats.  Saw eye doctor in June.    Left middle finger pain:  Notes he was rough housing with some kids at his house few days ago.  Developed middle finger pain.  No overt swelling.  No loss of function.  Did fall getting out of car yesterday.  No change in symptoms.    Hypertension History:      He denies headache, chest pain, and dyspnea with exertion.        Positive major cardiovascular risk factors include diabetes, hyperlipidemia, and hypertension.  Negative major cardiovascular risk factors include male age less than 46 years old, negative family history for ischemic heart disease, and non-tobacco-user status.     Current Medications (verified): 1)  Crestor 20 Mg  Tabs (Rosuvastatin Calcium) .... One Tablet By Mouth Nightly For Cholesterol 2)   Lisinopril 10 Mg  Tabs (Lisinopril) .... Take 1 Tab By Mouth Daily 3)  Glucophage 1000 Mg Tabs (Metformin Hcl) .... Take 1 Tablet By Mouth Two Times A Day 4)  Pepcid 20 Mg Tabs (Famotidine) .... Take 1 Tablet By Mouth Two Times A Day As Needed For Indigestion 5)  Air Cast Ankle Strap Splint .... Apply To Left Ankle While Active For 2-4 Weeks, Until Ankle Pain Improves 6)  Lantus Solostar 100 Unit/ml Soln (Insulin Glargine) .... Inject 20 Unit At Bedtime 7)  Lantus Solostar Pen Needles .... As Directed  Allergies (verified): No Known Drug Allergies  Social History: Reviewed history from 08/16/2008 and no changes required. Lives with mother. 1 daughter who lives in another state and the mother has a restraining order against patient. Never Smoked Alcohol use-no Drug use-no Learning disabled.  Physical Exam  General:  alert, well-developed, and well-nourished.   Head:  normocephalic and atraumatic.   Lungs:  normal breath sounds.   Heart:  normal rate and regular rhythm.   Abdomen:  soft and no hepatomegaly.   Msk:  bilat feet without callus or ulcers toes with exaggerated dorsiflexion bilat  left hand: middle finger without deformity all tendons intact warm and dry no pain over joint spaces or shafts  Pulses:  cap refill in left middle finger wnl Extremities:  no edema Neurologic:  alert & oriented X3 and cranial nerves II-XII intact.   Psych:  normally interactive.     Impression & Recommendations:  Problem # 1:  DIABETES MELLITUS, TYPE II (ICD-250.00)  sugar today is uncontrolled readings at home sound better by his description get A1C refer back to dietician as I think his diet is very poor   His updated medication list for this problem includes:    Lisinopril 10 Mg Tabs (Lisinopril) .[name]... Take 1 tab by mouth daily    Glucophage 1000 Mg Tabs (Metformin hcl) .[name]... Take 1 tablet by mouth two times a day    Lantus Solostar 100 Unit/ml Soln (Insulin glargine) .....  Inject 30 unit at bedtime  Orders: T-Comprehensive Metabolic Panel [id number]) T- Hemoglobin A1C (28413-24401) Capillary Blood Glucose/CBG (02725)  Problem # 2:  HYPERTENSION (ICD-401.9)  controlled  His updated medication list for this problem includes:    Lisinopril 10 Mg Tabs (Lisinopril) .[name]... Take 1 tab by mouth daily  Orders: T-Comprehensive Metabolic Panel ([id number])  Problem # 3:  HYPERLIPIDEMIA (ICD-272.4)  will bring back for FLP  His updated medication list for this problem includes:    Crestor 20 Mg Tabs (Rosuvastatin calcium) ..... One tablet by mouth nightly for cholesterol  Orders: T-Comprehensive Metabolic Panel ([id number])  Problem # 4:  SECONDARY NEUROENDOCRINE TUMOR UNSPECIFIED SITE (ICD-209.70) due for f/u with oncology in August  Problem # 5:  FINGER SPRAIN (ICD-842.10) exam normal . . . do not feel that xray needed at this time buddy tape ice NSAIDs as needed f/u as needed if no improvement or worse over next several days, patient advised to call and we can set up xray  Complete Medication List: 1)  Crestor 20 Mg Tabs (Rosuvastatin calcium) .... One tablet by mouth nightly for cholesterol 2)  Lisinopril 10 Mg Tabs (Lisinopril) .... Take 1 tab by mouth daily 3)  Glucophage 1000 Mg Tabs (Metformin hcl) .... Take 1 tablet by mouth two times a day 4)  Pepcid 20 Mg Tabs (Famotidine) .... Take 1 tablet by mouth two times a day as needed for indigestion 5)  Air Cast Ankle Strap Splint  .... Apply to left ankle while active for 2-4 weeks, until ankle pain improves 6)  Lantus Solostar 100 Unit/ml Soln (Insulin glargine) .... Inject 30 unit at bedtime 7)  Lantus Solostar Pen Needles  .... As directed  Hypertension Assessment/Plan:      The patient's hypertensive risk group is category C: Target organ damage and/or diabetes.  His calculated 10 year risk of coronary heart disease is 11 %.  Today's blood pressure is 117/77.  His blood pressure goal is <  130/80.  Patient Instructions: 1)  Schedule appointment with Drucilla Schmidt for diet education.  Request mother come with him to the appointment. 2)  Schedule fasting lipids with lab in next 1-2 weeks.  Nothing to eat or drink after midnight except water. 3)  Buddy tape left middle finger for 3-4 days. 4)  Ice left middle finger two times a day for 2-3 days. 5)  Flex hand at least once a day. 6)  If pain no better or worse by end of week, call us. 7)  Please schedule a follow-up appointment in 3 months with Alaena Strader for diabetes.  8)  Take 400-600 mg of Ibuprofen (Advil, Motrin) with food every 4-6 hours as needed  for relief of pain or comfort of fever.

## 2010-10-11 NOTE — Assessment & Plan Note (Signed)
Summary: Patient never seen . . . did not bring meds.   Allergies: No Known Drug Allergies   Complete Medication List: 1)  Crestor 20 Mg Tabs (Rosuvastatin calcium) .... One tablet by mouth nightly for cholesterol 2)  Lisinopril 10 Mg Tabs (Lisinopril) .... Take 1 tab by mouth daily 3)  Glucophage 1000 Mg Tabs (Metformin hcl) .... Take 1 tablet by mouth two times a day 4)  Pepcid 20 Mg Tabs (Famotidine) .... Take 1 tablet by mouth two times a day as needed for indigestion 5)  Air Cast Ankle Strap Splint  .... Apply to left ankle while active for 2-4 weeks, until ankle pain improves 6)  Lantus Solostar 100 Unit/ml Soln (Insulin glargine) .... Inject 30 unit at bedtime 7)  Lantus Solostar Pen Needles  .... As directed

## 2010-10-11 NOTE — Assessment & Plan Note (Signed)
Summary: FOLLOW UP WITH [name] FOR DM IN ONE MONTH//GK   Vital Signs:  Patient profile:   45 year old male Height:      65.5 inches Weight:      265 pounds BMI:     43.58 Temp:     98.0 degrees F oral Pulse rate:   82 / minute Pulse rhythm:   regular Resp:     20 per minute BP sitting:   130 / 86  (left arm) Cuff size:   large  Vitals Entered By: Arm[place] Shannon (November 14, 2009 2:11 PM) CC: f/u one month dm.... , Hypertension Management Is Patient Diabetic? Yes Pain Assessment Patient in pain? no      CBG Result 320  Does patient need assistance? Functional Status Self care Ambulation Normal   Primary Care Provider:  Tereso Newcomer PA-C  CC:  f/u one month dm....  and Hypertension Management.  History of Present Illness: Here for f/u. Sugars high still. Brings in list of sugars in Feb.  Avg number around 250.  He says he is taking his metformin, but he still has several tablets in a bottle that was filled 10/17/2009.  He states he is taking his Lantus each night.  His mom puts his meds out for him.  He is supposed to be on lisinopril but does not have that with him today.  It is not clear if he is taking or not.  He is supposed to be on Crestor.  The bottle is empty.  It was filled in 03/2009.  His recent lipid panel had an LDL of 140+.  It appears he is noncompliant with all of his medicines.  Says he is under a lot of stress.  Feels depressed.  No suicidal ideations.    Hypertension History:      He denies chest pain, dyspnea with exertion, and syncope.        Positive major cardiovascular risk factors include diabetes, hyperlipidemia, and hypertension.  Negative major cardiovascular risk factors include male age less than 10 years old, negative family history for ischemic heart disease, and non-tobacco-user status.     Problems Prior to Update: 1)  Family Stress  (ICD-V61.9) 2)  Gerd  (ICD-530.81) 3)  Ankle Injury, Left  (ICD-959.7) 4)  Hematuria, Hx of  (ICD-V13.09) 5)   Abdominal Pain  (ICD-789.00) 6)  Ventral Hernia, Incisional  (ICD-553.21) 7)  Urinary Tract Infection, Acute  (ICD-599.0) 8)  Tinea Pedis  (ICD-110.4) 9)  Secondary Neuroendocrine Tumor Unspecified Site  (ICD-209.70) 10)  Abrasion  (ICD-919.0) 11)  Anger  (ICD-312.00) 12)  Hypertension  (ICD-401.9) 13)  Hyperlipidemia  (ICD-272.4) 14)  Diabetes Mellitus, Type II  (ICD-250.00)  Current Medications (verified): 1)  Crestor 20 Mg  Tabs (Rosuvastatin Calcium) .... One Tablet By Mouth Nightly For Cholesterol 2)  Lisinopril 10 Mg  Tabs (Lisinopril) .... Take 1 Tab By Mouth Daily 3)  Glucophage 500 Mg Tabs (Metformin Hcl) .... Take 1 Tablet By Mouth Every 12 Hours 4)  Pepcid 20 Mg Tabs (Famotidine) .... Take 1 Tablet By Mouth Two Times A Day As Needed For Indigestion 5)  Air Cast Ankle Strap Splint .... Apply To Left Ankle While Active For 2-4 Weeks, Until Ankle Pain Improves 6)  Lantus Solostar 100 Unit/ml Soln (Insulin Glargine) .... Inject 15 Unit At Bedtime 7)  Lantus Solostar Pen Needles .... As Directed  Allergies (verified): No Known Drug Allergies  Social History: Reviewed history from 08/16/2008 and no changes required. Lives  with mother. 1 daughter who lives in another state and the mother has a restraining order against patient. Never Smoked Alcohol use-no Drug use-no Learning disabled.  Review of Systems Endo:  Denies excessive hunger, excessive thirst, and polyuria.  Physical Exam  General:  alert, well-developed, and well-nourished.   Head:  normocephalic and atraumatic.   Neck:  supple.   Lungs:  normal breath sounds, no crackles, and no wheezes.   Heart:  normal rate, regular rhythm, and no murmur.   Abdomen:  soft and non-tender.   Msk:  toes drawn up c/w diab neuropathy no callus or ulcer Neurologic:  alert & oriented X3 and cranial nerves II-XII intact.   Psych:  normally interactive.     Impression & Recommendations:  Problem # 1:  DIABETES MELLITUS,  TYPE II (ICD-250.00)  uncontrolled due to noncompliance also, diet is suspect (had 2 sausage biscuits at [employer] this am) refer back to dietician for refresher increase metformin to 1000 mg two times a day increase Lantus to 20 units at bedtime  His updated medication list for this problem includes:    Lisinopril 10 Mg Tabs (Lisinopril) .[name]... Take 1 tab by mouth daily    Glucophage 1000 Mg Tabs (Metformin hcl) .[name]... Take 1 tablet by mouth two times a day    Lantus Solostar 100 Unit/ml Soln (Insulin glargine) ..... Inject 20 unit at bedtime  Orders: Diabetic Clinic Referral (Diabetic)  Problem # 2:  HYPERTENSION (ICD-401.9) BP ok but not optimal not sure if he is taking lisinopril or not  His updated medication list for this problem includes:    Lisinopril 10 Mg Tabs (Lisinopril) .[name]... Take 1 tab by mouth daily  Problem # 3:  HYPERLIPIDEMIA (ICD-272.4) doubt he is taking correctly refill med and recheck in 3 mos  His updated medication list for this problem includes:    Crestor 20 Mg Tabs (Rosuvastatin calcium) ..... One tablet by mouth nightly for cholesterol  Problem # 4:  FAMILY STRESS (ICD-V61.9) offered referral to LCSW he declined  Complete Medication List: 1)  Crestor 20 Mg Tabs (Rosuvastatin calcium) .... One tablet by mouth nightly for cholesterol 2)  Lisinopril 10 Mg Tabs (Lisinopril) .... Take 1 tab by mouth daily 3)  Glucophage 1000 Mg Tabs (Metformin hcl) .... Take 1 tablet by mouth two times a day 4)  Pepcid 20 Mg Tabs (Famotidine) .... Take 1 tablet by mouth two times a day as needed for indigestion 5)  Air Cast Ankle Strap Splint  .... Apply to left ankle while active for 2-4 weeks, until ankle pain improves 6)  Lantus Solostar 100 Unit/ml Soln (Insulin glargine) .... Inject 20 unit at bedtime 7)  Lantus Solostar Pen Needles  .... As directed  Hypertension Assessment/Plan:      The patient's hypertensive risk group is category C: Target organ damage and/or  diabetes.  His calculated 10 year risk of coronary heart disease is 14 %.  Today's blood pressure is 130/86.  His blood pressure goal is < 130/80.  Patient Instructions: 1)  Increase your Lantus to 20 units at bedtime. 2)  Increase your metformin to 1000 mg two times a day.  I have given you a new prescription for 1000 mg tablets.  You can take 2 of the 500 mg tablets two times a day unitl you get your new prescription. 3)  You need to start taking your Lisinopril. 4)  You need to start taking your Crestor again.  Start back on it today.  It has been sent to the pharmacy on [address] 5)  You need to make sure you take all of your medicines as prescribed every day.  Missing medications means your sugar will not be controlled and will cause complications (blindness, kidney failure, infections, etc.). 6)  Make an appointment with your eye doctor to have your eyes checked for diabetes. 7)  Check your sugars every morning.  Bring the readings in to your next appointment. 8)  Please have your mother come in to your next appointment. 9)  Please schedule a follow-up appointment in 1 month with [name] for diabetes.  Prescriptions: GLUCOPHAGE 1000 MG TABS (METFORMIN HCL) Take 1 tablet by mouth two times a day  #60 x 5   Entered and Authorized by:   Tereso Newcomer PA-C   Signed by:   Tereso Newcomer PA-C on 11/14/2009   Method used:   Electronically to        [facility] [phone]* (retail)       [address]       [place], [place]  [address]       Ph: [phone]       Fax: [phone]   RxID:   [id number] CRESTOR 20 MG  TABS (ROSUVASTATIN CALCIUM) One tablet by mouth nightly for cholesterol  #30 x 6   Entered and Authorized by:   Tereso Newcomer PA-C   Signed by:   Tereso Newcomer PA-C on 11/14/2009   Method used:   Electronically to        [facility] [phone]* (retail)       [address]       [place], [place]  [address]       Ph: [phone]       Fax: [phone]   RxID:    [id number]

## 2010-10-16 ENCOUNTER — Telehealth (INDEPENDENT_AMBULATORY_CARE_PROVIDER_SITE_OTHER): Payer: Self-pay | Admitting: Nurse Practitioner

## 2010-10-19 ENCOUNTER — Encounter (INDEPENDENT_AMBULATORY_CARE_PROVIDER_SITE_OTHER): Payer: Self-pay | Admitting: Nurse Practitioner

## 2010-10-25 NOTE — Letter (Signed)
Summary: Generic Letter  [facility]  [address] [place], [place] [address]   Phone: [phone]  Fax: [phone]        10/19/2010  Darrell Shimabukuro [address] [place], [place]  [address]  Dear Mr. BALLOW,  We have been unable to contact you by telephone.  Please call our office, at your earliest convenience, so that we may speak with you.   Sincerely,   Dutch Quint RN

## 2010-10-25 NOTE — Progress Notes (Signed)
Summary: Query:  Refill Lantus?  Phone Note Outgoing Call   Summary of Call: Last seen 03/2010.  Refill Lantus per protocol? Initial call taken by: Dutch Quint RN,  October 16, 2010 3:53 PM  Follow-up for Phone Call        ok to refill. advise pt that he needs f/u ASAP- he should be seen AT LEAST twice yearly for diabetes.  On his last visit he was uncontrolled so he needs to f/u Follow-up by: Lehman Prom FNP,  October 17, 2010 9:26 AM  Additional Follow-up for Phone Call Additional follow up Details #1::        Refill completed.  Left message on voicemail for pt. to return call.  Dutch Quint RN  October 17, 2010 11:41 AM  Left message on voicemail for pt. to return call.  Dutch Quint RN  October 18, 2010 11:34 AM  Left message on voicemail for pt. to return call.  Letter sent.  Dutch Quint RN  October 19, 2010 12:56 PM

## 2010-10-30 ENCOUNTER — Encounter (INDEPENDENT_AMBULATORY_CARE_PROVIDER_SITE_OTHER): Payer: Self-pay | Admitting: Internal Medicine

## 2010-10-30 ENCOUNTER — Encounter: Payer: Self-pay | Admitting: Internal Medicine

## 2010-10-30 LAB — CONVERTED CEMR LAB: Blood Glucose, Fingerstick: 258

## 2010-11-06 NOTE — Assessment & Plan Note (Signed)
Summary: 3 month f/u on DM   Vital Signs:  Patient profile:   45 year old male Weight:      247.56 pounds Temp:     97.1 degrees F oral Pulse rate:   82 / minute Pulse rhythm:   regular Resp:     18 per minute BP sitting:   124 / 82  (left arm) Cuff size:   regular  Vitals Entered By: Hale Drone CMA (October 30, 2010 11:47 AM) CC: 3 month f/u on DM. Needing some medications for heart burn.  Is Patient Diabetic? Yes Pain Assessment Patient in pain? yes     Location: stomach CBG Result 258 CBG Device ID A non fasting   Does patient need assistance? Functional Status Self care Ambulation Normal   Primary Care Provider:  Tereso Newcomer PA-C  CC:  3 month f/u on DM. Needing some medications for heart burn. .  History of Present Illness: 45 yo male here after not being seen since 03/2010.  1.  Weight loss:  pt. states has been purposefully not eating to see if will lose weight.  Eating once daily if feels he is hungry.  Sometimes will go all day without eating.      2.  DM:  drinks 1 1/2 glasses of sweet tea daily.  Not interested in sugar free sweeteners.  Currently living with his mother, has been out of Lantus.  Problems with his daughter and daughter's boyfriend.  Out of all of meds until just recently, but should have been out about 6 months or more ago.    Current Medications (verified): 1)  Crestor 20 Mg  Tabs (Rosuvastatin Calcium) .... One Tablet By Mouth Nightly For Cholesterol 2)  Lisinopril 10 Mg  Tabs (Lisinopril) .... Take 1 Tab By Mouth Daily 3)  Glucophage 1000 Mg Tabs (Metformin Hcl) .... Take 1 Tablet By Mouth Two Times A Day 4)  Pepcid 20 Mg Tabs (Famotidine) .... Take 1 Tablet By Mouth Two Times A Day As Needed For Indigestion 5)  Air Cast Ankle Strap Splint .... Apply To Left Ankle While Active For 2-4 Weeks, Until Ankle Pain Improves 6)  Lantus Solostar 100 Unit/ml Soln (Insulin Glargine) .... Inject 30 Unit At Bedtime 7)  Lantus Solostar Pen Needles ....  As Directed 8)  Fish Oil 1000 Mg Caps (Omega-3 Fatty Acids) .... 2 Caps By Mouth Once Daily  Allergies (verified): No Known Drug Allergies  Physical Exam  General:  Disheveled, NAD Lungs:  Normal respiratory effort, chest expands symmetrically. Lungs are clear to auscultation, no crackles or wheezes. Heart:  Normal rate and regular rhythm. S1 and S2 normal without gallop, murmur, click, rub or other extra sounds.  Radial pulses normal and equal.  Diabetes Management Exam:    Foot Exam (with socks and/or shoes not present):       Sensory-Monofilament:          Left foot: normal          Right foot: normal   Impression & Recommendations:  Problem # 1:  HYPERTENSION (ICD-401.9) Refill meds His updated medication list for this problem includes:    Lisinopril 10 Mg Tabs (Lisinopril) .[name]... Take 1 tab by mouth daily  Problem # 2:  DIABETES MELLITUS, TYPE II (ICD-250.00) Refill meds and get in with Nutrition Pt. does not seem motivated to take better care of himself His updated medication list for this problem includes:    Lisinopril 10 Mg Tabs (Lisinopril) .[name]... Take 1 tab  by mouth daily    Glucophage 1000 Mg Tabs (Metformin hcl) .[name]... Take 1 tablet by mouth two times a day    Lantus Solostar 100 Unit/ml Soln (Insulin glargine) ..... Inject 30 unit at bedtime  Orders: Capillary Blood Glucose/CBG (82948) Hgb A1C (16109UE) Nutrition Referral (Nutrition)  Problem # 3:  HYPERLIPIDEMIA (ICD-272.4) Restart meds. His updated medication list for this problem includes:    Crestor 20 Mg Tabs (Rosuvastatin calcium) ..... One tablet by mouth nightly for cholesterol  Complete Medication List: 1)  Crestor 20 Mg Tabs (Rosuvastatin calcium) .... One tablet by mouth nightly for cholesterol 2)  Lisinopril 10 Mg Tabs (Lisinopril) .... Take 1 tab by mouth daily 3)  Glucophage 1000 Mg Tabs (Metformin hcl) .... Take 1 tablet by mouth two times a day 4)  Pepcid 20 Mg Tabs (Famotidine) .... Take 1  tablet by mouth two times a day as needed for indigestion 5)  Air Cast Ankle Strap Splint  .... Apply to left ankle while active for 2-4 weeks, until ankle pain improves 6)  Lantus Solostar 100 Unit/ml Soln (Insulin glargine) .... Inject 30 unit at bedtime 7)  Lantus Solostar Pen Needles  .... As directed 8)  Fish Oil 1000 Mg Caps (Omega-3 fatty acids) .... 2 caps by mouth once daily  Patient Instructions: 1)  Follow up with Dr. Delrae Alfred in 4 months  2)  Call if you do not hear from Nutrition.   Orders Added: 1)  Capillary Blood Glucose/CBG [82948] 2)  Hgb A1C [83036QW] 3)  Nutrition Referral [Nutrition] 4)  Est. Patient Level III [45409]   Immunization History:  Influenza Immunization History:    Influenza:  refused (10/30/2010)  Not Administered:    Influenza Vaccine not given due to: declined   Immunization History:  Influenza Immunization History:    Influenza:  refused (10/30/2010)   Diabetic Foot Exam Last Podiatry Exam Date: 09/07/2009    10-g (5.07) Semmes-Weinstein Monofilament Test Performed by: Hale Drone CMA          Right Foot          Left Foot Visual Inspection     normal         normal Test Control      normal         normal Site 1         normal         normal Site 2         normal         normal Site 3         normal         normal Site 4         normal         normal Site 5         normal         normal Site 6         normal         normal Site 7         normal         normal Site 8         normal         normal Site 9         normal         normal Site 10         normal         normal  Impression  normal         normal  Laboratory Results   Blood Tests   Date/Time Received: October 30, 2010 12:13 PM   HGBA1C: 8.8%   (Normal Range: Non-Diabetic - 3-6%   Control Diabetic - 6-8%) CBG Random:: 258mg /dL      Appended Document: 3 month f/u on DM Pt brings up seborrhea in back of scalp as discharging him--Triamcinolone sent with  med refills.

## 2010-12-16 LAB — URINE MICROSCOPIC-ADD ON

## 2010-12-16 LAB — URINALYSIS, ROUTINE W REFLEX MICROSCOPIC
Bilirubin Urine: NEGATIVE
Glucose, UA: NEGATIVE mg/dL
Ketones, ur: NEGATIVE mg/dL
Nitrite: NEGATIVE
Protein, ur: NEGATIVE mg/dL
Specific Gravity, Urine: 1.005 (ref 1.005–1.030)
Urobilinogen, UA: 1 mg/dL (ref 0.0–1.0)
pH: 6 (ref 5.0–8.0)

## 2010-12-16 LAB — GLUCOSE, CAPILLARY: Glucose-Capillary: 149 mg/dL — ABNORMAL HIGH (ref 70–99)

## 2010-12-17 LAB — GLUCOSE, CAPILLARY
Glucose-Capillary: 129 mg/dL — ABNORMAL HIGH (ref 70–99)
Glucose-Capillary: 132 mg/dL — ABNORMAL HIGH (ref 70–99)
Glucose-Capillary: 133 mg/dL — ABNORMAL HIGH (ref 70–99)
Glucose-Capillary: 143 mg/dL — ABNORMAL HIGH (ref 70–99)
Glucose-Capillary: 147 mg/dL — ABNORMAL HIGH (ref 70–99)
Glucose-Capillary: 149 mg/dL — ABNORMAL HIGH (ref 70–99)
Glucose-Capillary: 155 mg/dL — ABNORMAL HIGH (ref 70–99)
Glucose-Capillary: 161 mg/dL — ABNORMAL HIGH (ref 70–99)
Glucose-Capillary: 188 mg/dL — ABNORMAL HIGH (ref 70–99)
Glucose-Capillary: 202 mg/dL — ABNORMAL HIGH (ref 70–99)

## 2010-12-17 LAB — BASIC METABOLIC PANEL
BUN: 6 mg/dL (ref 6–23)
CO2: 27 mEq/L (ref 19–32)
Calcium: 8.8 mg/dL (ref 8.4–10.5)
Chloride: 102 mEq/L (ref 96–112)
Creatinine, Ser: 0.85 mg/dL (ref 0.4–1.5)
GFR calc Af Amer: >60 mL/min (ref >60)
Glucose, Bld: 120 mg/dL — ABNORMAL HIGH (ref 70–99)

## 2010-12-18 LAB — CBC
HCT: 43.9 % (ref 39.0–52.0)
Hemoglobin: 15.1 g/dL (ref 13.0–17.0)
MCV: 93.9 fL (ref 78.0–100.0)
RBC: 4.67 MIL/uL (ref 4.22–5.81)
WBC: 5 10*3/uL (ref 4.0–10.5)

## 2010-12-18 LAB — DIFFERENTIAL
Eosinophils Absolute: 0.2 10*3/uL (ref 0.0–0.7)
Eosinophils Relative: 3 % (ref 0–5)
Lymphocytes Relative: 44 % (ref 12–46)
Lymphs Abs: 2.2 10*3/uL (ref 0.7–4.0)
Monocytes Relative: 6 % (ref 3–12)

## 2010-12-18 LAB — BASIC METABOLIC PANEL
Chloride: 107 mEq/L (ref 96–112)
GFR calc Af Amer: >60 mL/min (ref >60)
GFR calc non Af Amer: >60 mL/min (ref >60)
Potassium: 3.6 mEq/L (ref 3.5–5.1)
Sodium: 138 mEq/L (ref 135–145)

## 2011-01-12 ENCOUNTER — Emergency Department (HOSPITAL_COMMUNITY): Payer: Medicaid Other

## 2011-01-12 ENCOUNTER — Emergency Department (HOSPITAL_COMMUNITY)
Admission: EM | Admit: 2011-01-12 | Discharge: 2011-01-12 | Disposition: A | Discharge status: Home / Self Care | Payer: Medicaid Other | Attending: Emergency Medicine | Admitting: Emergency Medicine

## 2011-01-12 DIAGNOSIS — E119 Type 2 diabetes mellitus without complications: Secondary | ICD-10-CM | POA: Insufficient documentation

## 2011-01-12 DIAGNOSIS — Z79899 Other long term (current) drug therapy: Secondary | ICD-10-CM | POA: Insufficient documentation

## 2011-01-12 DIAGNOSIS — Y92009 Unspecified place in unspecified non-institutional (private) residence as the place of occurrence of the external cause: Secondary | ICD-10-CM | POA: Insufficient documentation

## 2011-01-12 DIAGNOSIS — I10 Essential (primary) hypertension: Secondary | ICD-10-CM | POA: Insufficient documentation

## 2011-01-12 DIAGNOSIS — S6990XA Unspecified injury of unspecified wrist, hand and finger(s), initial encounter: Secondary | ICD-10-CM | POA: Insufficient documentation

## 2011-01-12 DIAGNOSIS — W230XXA Caught, crushed, jammed, or pinched between moving objects, initial encounter: Secondary | ICD-10-CM | POA: Insufficient documentation

## 2011-01-12 DIAGNOSIS — E785 Hyperlipidemia, unspecified: Secondary | ICD-10-CM | POA: Insufficient documentation

## 2011-01-12 DIAGNOSIS — M79609 Pain in unspecified limb: Secondary | ICD-10-CM | POA: Insufficient documentation

## 2011-01-12 DIAGNOSIS — Z85038 Personal history of other malignant neoplasm of large intestine: Secondary | ICD-10-CM | POA: Insufficient documentation

## 2011-01-12 DIAGNOSIS — S6390XA Sprain of unspecified part of unspecified wrist and hand, initial encounter: Secondary | ICD-10-CM | POA: Insufficient documentation

## 2011-01-12 DIAGNOSIS — S6980XA Other specified injuries of unspecified wrist, hand and finger(s), initial encounter: Secondary | ICD-10-CM | POA: Insufficient documentation
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
## 2011-01-22 NOTE — Op Note (Signed)
[name], SCHRADER NO.:  [id number]   MEDICAL RECORD NO.:  [id number]          PATIENT TYPE:  AMB   LOCATION:  DAY                          FACILITY:  [facility]   PHYSICIAN:  Lennie Muckle, MD      DATE OF BIRTH:  March 12, 1966   DATE OF PROCEDURE:  02/10/2009  DATE OF DISCHARGE:                               OPERATIVE REPORT   PREOPERATIVE DIAGNOSIS:  Incisional hernia.   POSTOPERATIVE DIAGNOSIS:  Incisional hernia.   PROCEDURE:  Laparoscopic incisional hernia repair.   SURGEON:  Amber L. Freida Busman, MD.   ASSISTANTThornton Park. Daphine Deutscher, MD.   ANESTHESIA:  General endotracheal anesthesia.   FINDINGS:  A 10 x 12 hernia defect at the midline/   No immediate complications.   No drains were placed.   INDICATIONS FOR PROCEDURE:  [name] is a 45 year old male who had had  a laparoscopic hand-assisted right colon resection by me on July 22, 2008.  He had had a wound infection and began to have swelling in his  upper abdomen a couple of months ago.  He had had a burning type of a  sensation and a bulge.  The pain was somewhat worse with activities.  Examination was consistent with an incisional hernia.  I had talked to  him about laparoscopic possible open repair.  The risks of surgery  including but not limited to bleeding, infection, open repair,  enterotomy were explained to [name] and his mother.  Informed consent  was obtained prior to the procedure.   DETAILS OF PROCEDURE:  [name] was identified in the preoperative  holding area.  He received 2 grams of Kefzol and was taken to the  operating room.  Once in the operating room, he was placed in supine  position.  After administration of general endotracheal anesthesia, SCDs  were placed on the lower abdomen.  He had voided just prior to coming to  the OR, thus a Foley catheter was not placed.  A time-out procedure  indicating the patient and procedure was performed.  An incision was  placed on the  left side of the abdomen.  Using a 5-mm trocar and the  Optiview, I placed a trocar into the abdominal cavity.  All layers of  abdominal wall were visualized upon entry.  After adequate pneumo  insufflation, I inspected the abdomen.  There seemed to be no evidence  of injury upon placement of trocar.  There were omental adhesions to the  abdominal wall.  I placed a 5-mm trocar in the left lower abdomen and a  5-mm trocar in the right upper abdomen and left lower abdomen under  visualization with the camera.  Using the Harmonic scalpel and sharp  dissection, I took down the omental adhesions to the abdominal wall.  On  the right side of the abdomen there was small bowel adhesed to the  abdominal wall.  I sharply dissected this.  I sharply dissected this.  I  then upsized the left upper quadrant trocar to an 11-mm trocar and had  Dr. Daphine Deutscher place  a 5-mm trocar on the right side of the abdomen.  I  measured the hernia defect to be 10 x 12 cm.  I then chose a piece of 20  x 26 piece Proceed mesh.  This was secured while measuring 5 cm on  either side of the defect with 1 Prolene suture.  After I had tacked up  the 4 sides of the mesh, I tacked the mesh with the ProTack device.  This was circumferentially placed around the mesh.  I then placed  intervening sutures of Gore-Tex suture to thoroughly secure the mesh to  the abdominal wall.  Once the mesh was secured with tacks and the  suture, I closed the port sites and the 11-mm trocar site using a 0  Vicryl suture and the suture passer.  A final inspection of the abdomen  revealed no bleeding, no evidence of injury.  I then released  pneumoperitoneum and removed the trocars.  I closed the skin with 4-0  Monocryl.  Dermabond was placed as a final dressing.  The patient was  then extubated and transported to the post anesthesia care unit in  stable condition.  He will be given Percocet #60 for pain and instructed  to take over-the-counter  ibuprofen and stool softeners.  He will follow  up with me in approximately 2 or 3 weeks.      Lennie Muckle, MD  Electronically Signed     ALA/MEDQ  D:  02/10/2009  T:  02/10/2009  Job:  [id number]   cc:   Dala Dock

## 2011-01-22 NOTE — Op Note (Signed)
[name], [name] NO.:  [id number]   MEDICAL RECORD NO.:  [id number]          PATIENT TYPE:  INP   LOCATION:  5151                         FACILITY:  [facility]   PHYSICIAN:  Lennie Muckle, MD      DATE OF BIRTH:  Jan 28, 1966   DATE OF PROCEDURE:  DATE OF DISCHARGE:                               OPERATIVE REPORT   PREOPERATIVE DIAGNOSIS:  Right colon mass.   POSTOPERATIVE DIAGNOSIS:  Right colon mass.   PROCEDURE:  Laparoscopic hand-assisted right colon resection.   SURGEON:  Lennie Muckle, MD.   ASSISTANT:  [name], [name].   ANESTHESIA:  General endotracheal anesthesia.   FINDINGS:  Inflammatory reaction of right colon, adherence to the liver,  status post right colon.   ESTIMATED BLOOD LOSS:  250 mL.   COMPLICATIONS:  No immediate complications.   DRAINS:  No drains were placed.   INDICATIONS FOR PROCEDURE:  [name] is a 45 year old male who was  admitted with right-sided abdominal pain approximately 2 weeks.  He had  a mild elevation of white count of 50 with inflammatory reaction around  the cecum and ileum.  It was believed he had an ileitis.  He had had a  scope, which revealed a mass like lesion on the right colon.  A barium  enema revealed an obstructing lesion in the right colon, therefore, it  was discussed with the patient perform a resection during this  hospitalization.  Risks of the surgery including bleeding, infection,  anastomotic leak were explained to the patient.  Informed consent was  obtained.   DETAILS OF THE PROCEDURE:  [name] was identified in the preoperative  holding area, and his right-sided abdomen was marked by me.  He had been  on Zosyn and Flagyl preoperatively.  Once in the operating room, he was  placed in supine position.  After administration of general endotracheal  anesthesia, Foley catheter was placed.  His abdomen was prepped and  draped in the usual sterile fashion.  A time out with identification  of  the patient and procedure were performed.  An incision was placed just  above the umbilicus.  I placed a 5-mm trocar using OptiView inside the  abdominal cavity.  All layers of abdominal wall were visualized upon  entry.  I then insufflated the abdomen.  Upon inspection of the abdomen,  there was no evidence of injury upon placement of the trocar.  I then  placed an additional 5-mm trocar in the upper abdomen.  There were  adhesions of omentum to the abdominal wall, which I took down with  laparoscopic scissors.  This was near the liver.  I then placed an  additional 5-mm trocar in the right upper quadrant.  I attempted to  mobilize with the laparoscopic instruments.  I started by taking the  omentum away from the liver bed using the harmonic scalpel.  I was then  able to inspect part of the colon.  I followed this down to the cecum  and began mobilizing laterally along the line of Toldt.  The  peritoneum  was dissected with the LigaSure.  I then proceeded proximally up towards  the hepatic flexure and began taking this down with a LigaSure device.  I then elected to place a HandPort just above the umbilicus.  I made an  incision with #15 blade, divided with electrocautery, and placed the  GelPort into the abdominal cavity.  Using my hand, I grasped the colon,  and continued dissecting near the hepatic flexure.  I was able to fully  mobilize the hepatic flexure.  There were dense amount of adhesions from  his previous cholecystectomy, but I was able to dissect this out using  LigaSure device with retraction of my hand.  I then mobilized the  omentum off the transverse colon.  I was able to fully mobilize the  right colon.  I seemed to have enough length and therefore I brought the  specimen out through the HandPort site.  I scored across the mesentery  with the electrocautery.  I divided the mesentery.  I ticked a place on  the distal ileum and stapled using a 75 GIA stapler.  I then  dissected  across the mesentery using the LigaSure device.  I proceeded up towards  the inflammatory mass.  There were dense adhesions and there were some  retraction into the mesentery.  We did not palpate any enlarged lymph  nodes and the liver appeared normal.  I continued dissection of the  mesentery with the LigaSure device.  There was some tension with trying  to divide some of the mesentery and allowed the specimen return back to  the abdomen.  Once again, insufflated the abdomen and continued  dissecting with the LigaSure across the mesentery.  Once I was at a  distance from the mass, I once again brought the specimen out through  the laparoscopic port site.  I then stapled across the transverse colon  with the GIA  stapler.  The specimen was passed off the operative field  after marking the distal ileum with a suture.  I brought the transverse  colon out throughout the operative wound and tried to find the distal  ileum.  We had to insufflate the abdomen to find the distal segment.  We  then performed a side by side anastomosis using another load of the 75  GIA stapler.  The staple line was inspected.  There seemed to be no  bleeding.  I then closed the enterotomy site using 3-0 Vicryl sutures in  interrupted fashion.  The specimen was allowed to retract in the abdomen  by irrigating with 3 L of saline.  There was old blood within the  abdomen, but I have since found no evidence of bleeding.  I then  reinsufflated the abdomen for further inspection.  There was no injury  to other organs.  I found no evidence of bleeding.  I then closed the  fascial defect of the HandPort using a running looped PDS suture.  Final  inspection on the abdomen, there was a small bit of omentum within the  suture material.  I was able to pull this down with blunt retraction.  Final inspection, no bleeding and no evidence of injury to other organs.  I then removed the peritoneum and removed the trocars.   Skin was closed  with 4-0 Monocryl.  Steri-Strips placed for dressing.  The patient was  extubated and transported to postanesthesia care unit in stable  condition.  He will be kept monitored overnight, we will add  clears  tomorrow, and will be out of bed tomorrow.      Lennie Muckle, MD  Electronically Signed     ALA/MEDQ  D:  07/22/2008  T:  07/23/2008  Job:  [id number]   cc:   Everardo All. Madilyn Fireman, [name].

## 2011-01-22 NOTE — Discharge Summary (Signed)
[name], PANN NO.:  [id number]   MEDICAL RECORD NO.:  [id number]          PATIENT TYPE:  INP   LOCATION:  5159                         FACILITY:  [facility]   PHYSICIAN:  Monte Fantasia, MD  DATE OF BIRTH:  12/13/1965   DATE OF ADMISSION:  07/18/2008  DATE OF DISCHARGE:  07/29/2008                               DISCHARGE SUMMARY   CONSULTING PHYSICIAN:  Lennie Muckle, MD.   DISCHARGE DIAGNOSES:  1. Right colon neuroendocrine tumor.  2. Status post laparoscopic  right colon resection of ileocecal mass.  3. Paralytic ileus, resolved.  4. Hypokalemia, resolved.  5. Hypertension, blood pressure well controlled.   MEDICATIONS ON DISCHARGE:  NovoLog 11 units subcutaneous t.i.d.  Lantus 12 units subcutaneous q.h.s.  Lisinopril 10 mg p.o. daily.  Metformin 500 mg p.o. b.i.d.  Reglan 10 mg p.o. t.i.d. p.r.n. nausea.  Protonix 40 mg p.o. q.12h.  Crestor 40 mg p.o. q.h.s.  Albuterol 2 puffs MDI inhalation q.6h. p.r.n. breathlessness.   COURSE DURING HOSPITAL STAY:  The patient is a 45 year old Caucasian  male.  The patient was admitted for evaluation on November 9 with  complaints of right-sided abdominal pain.  CT scan of the abdomen and  pelvis done on admission showed abnormality of the descending colon,  appendix, and asymmetry of the ascending colon and ascites with  thickened gallbladder and thickened ileal loops.  The patient underwent  colonoscopy which showed abnormal mucosa corresponding to the lesion  seen on the CT. Biopsy was taken.  However, it was essentially  nondiagnostic.  As per the GI recommendations, the MRI done on November  12 showed an abdominal process involving the ascending colon and a mass  effect involving the medial aspect and descending colon.  The patient  was scheduled for the surgery and underwent the surgery on November 13  with laparoscopic-assisted-type colon resection.  The patient had  uneventful recovery. However,  recovered well and now the patient  tolerated full diet for the last 48 hours.  The patient is now  clinically stable and can be discharged to observed as an outpatient.  The patient was also evaluated by the hematology/oncology team on  November 18 prior to discharge in view of the neuroendocrine tumor as  per recommendation with new diagnosis of the gastritis with  neuroendocrine tumor with nodes which are positive.  He was asked to  follow up as an outpatient to [facility] office and need to schedule  date and time for the appointment.   PROCEDURES AND RADIOLOGY:  Operative procedure done on July 22, 2008  laparoscopic right colon resection.   RADIOLOGICAL INVESTIGATION:  CT of the abdomen done on July 18, 2008.  Impression:  Abnormality of the ascending colon, appendix and mesentery  of the ascending colon.  There appears to be a nodule on the ascending  colon measuring 1.8x2.3 cm.  Differential diagnosis includes CT  inflammatory process such as appendicitis, neoplasm, or carcinoid .  CT  of the pelvis thickened gallbladder with thickened discharge ileal  loops.   Barium enema done July 12, 2008 impression  abnormal process involving  the ascending colon. There was mass effect involving the medial aspect  of the distal ascending colon.   X-ray of the abdomen done on July 26, 2008 impression bowel gas  pattern consistent with adynamic nonobstructive ileus.   LABS ON DISCHARGE:  Labs done on July 29, 2008, total WBCs 6.6,  hemoglobin 12.7, hematocrit 37, platelets 289.  Sodium 137, potassium  3.5, chloride 107, bicarb 22, glucose 153, BUN 4, creatinine 0.79,  calcium 9.  Culture of the wound is pending.   DISPOSITION:  Plan to discharge the patient.  The patient has recovered  postoperatively well.  Tolerating full diet. Plan to discharge and  advised to follow up with primary care physician within the next one  week and to follow up with surgery team as an  outpatient for the same.      Monte Fantasia, MD  Electronically Signed     MP/MEDQ  D:  07/29/2008  T:  07/29/2008  Job:  [id number]

## 2011-01-22 NOTE — H&P (Signed)
NAMEARMEN, Juan NO.:  [name]   MEDICAL RECORD NO.:  [id number]          PATIENT TYPE:  INP   LOCATION:  5151                         FACILITY:  [facility]   PHYSICIAN:  [name], [name].   Juan OF BIRTH:  [name]   Juan OF ADMISSION:  07/18/2008  Juan OF DISCHARGE:                              HISTORY & PHYSICAL   CHIEF COMPLAINT:  Abdominal pain for the past 2 weeks.   HISTORY OF PRESENT ILLNESS:  The patient is a 45 year old white male  that presented to the emergency room with diffuse abdominal pain for the  past 2 weeks.  He said he has had no nausea, no vomiting, no  constipation.  He has had regular bowel movements.  He has noted no  blood in his stools.  The only thing that he has is just pain mostly in  the center of the abdomen and when he eats,  it feels worse.  Movement  can affect the pain at times.   PAST MEDICAL HISTORY:  1. Hypertension.  2. Dyslipidemia.  3. Diabetes.   FAMILY HISTORY:  Mother has diabetes.  Father died of leukemia in his  15s.   SOCIAL HISTORY:  He does not smoke.  He does not drink alcohol.  He is  single.  He has one child.  He is on disability.   MEDICATIONS:  1. Metformin 500 twice a day.  2. Lisinopril 10 mg every day.  3. Lipitor 80 mg at bedtime.   ALLERGIES:  NO KNOWN DRUG ALLERGIES.   REVIEW OF SYSTEMS:  Negative otherwise stated in HPI.   PHYSICAL EXAMINATION:  VITAL SIGNS:  Temperature 98.1, blood pressure  116/75, pulse 86, respirations 18, pulse ox 96% on room air.  GENERAL:  Patient is sitting up on the bed.  Does not seem to be in any acute  distress or any severe pain.  HEENT:  Head is normocephalic, atraumatic.  Pupils were equal, round, and reactive to light.  His throat was without  erythema.  CARDIOVASCULAR:  Regular rate and rhythm.  LUNGS:  Lungs were clear bilaterally.  ABDOMEN:  Soft, mild diffuse tenderness.  Positive bowel sounds.  EXTREMITIES:  Without edema.   LABORATORY  DATA:  Urinalysis negative.  WBC 6.5, hemoglobin 15.8, MCV  95.4, platelet 220,000.  Sodium 137, potassium 3.7, chloride 96, CO2 29,  glucose 196, BUN 4, creatinine 0.72, lipase 24.  Urinalysis small amount  of bilirubin, otherwise negative.  CT scan of the abdomen and pelvis  showed thickened colon, dilated appendix.  Differential includes  appendicitis versus neoplasm such as carcinoid or tonsillitis.   ASSESSMENT/PLAN:  1. Abdominal pain.  2. Hypertension.  3. Diabetes.  4. Dyslipidemia  5. Abnormal CT scan of the abdomen.   Will admit the patient to the hospital, start him on IV fluid, sliding  scale insulin, Lantus.  Start him on Flagyl.  Will get GI consult. Dr.  Ewing Schlein will see the patient in the morning.  Also consulted general  surgery, Dr. Lindie Spruce who will also see the patient.  Continue him on the  lisinopril.  Lovenox for DVT prophylaxis and Protonix for GI  prophylaxis.  Will also do rectal exam on the patient and send stool for  guaiac.      [name], [name].  Electronically Signed     NJ/MEDQ  D:  07/18/2008  T:  07/19/2008  Job:  [id number]

## 2011-01-22 NOTE — H&P (Signed)
Juan, [name] NO.:  [id number]   MEDICAL RECORD NO.:  [id number]          PATIENT TYPE:  INP   LOCATION:  5149                         FACILITY:  [facility]   PHYSICIAN:  Adolph Pollack, [name].DATE OF BIRTH:  03-Apr-1966   DATE OF ADMISSION:  08/02/2008  DATE OF DISCHARGE:                              HISTORY & PHYSICAL   ADMITTING PHYSICIAN:  Adolph Pollack, MD   CHIEF COMPLAINT:  Dehydration and wound infection.   HISTORY OF PRESENT ILLNESS:  [name] is a 45 year old male patient,  recently discharged from [facility] on November 2009, this past  Friday, after he underwent a lap and hand-assisted right colectomy for a  mass, the Pathology later revealed to be a neuroendocrine carcinoid  tumor with 3/12 lymph nodes positive.  Prior to discharge, the patient  was noted to have CP drainage at the hand-port incision, subsequently  the sutures were removed and wound was packed, and the wound was  cultured.  Since the patient was not experiencing any fever or  leukocytosis, and was eating and doing well otherwise and asking to go  home, he was actually pacing the house up and down until the Internal  Medicine Team could discharge him, it was opted to go ahead and  discharge him home, but with plans to follow up on the wound culture  today and check the wound today.  Since, there were concerns that,  despite all these findings, the patient did have a wound infection.  Since discharge, the patient just has not done well.  He has not been  able to eat or drink well.  He has had several episodes of vomiting.  Despite having bowel movements, he is able to take his Percocet without  having any nausea or abdominal pain, but he has been feeling quite weak.  He actually presented to the ER over the weekend because of increased  wound drainage, but was sent home.  He is unable to check his  Glucometers, because he cannot get his machine to work appropriately.  The [facility] nurses assisting with this.  Because of all  these issues, I asked [name] to evaluate the patient with me.   PAST MEDICAL HISTORY:  1. Hypertension.  2. Diabetes.  3. Dyslipidemia.   PAST SURGICAL HISTORY:  1. Open cholecystectomy for gallbladder issues.  2. Recent lap, hand-assisted right colon resection for right colon      mass by Dr. Bertram Savin, on July 22, 2008, with Pathology      positive for neuroendocrine tumor.   FAMILY MEDICAL HISTORY:  Noncontributory.   SOCIAL HISTORY:  The patient is unemployed.  No alcohol.  No tobacco.  His mother accompanies him today for the visit.   ALLERGIES:  NKDA.   CURRENT MEDICATIONS:  1. Metformin 500 mg b.i.d.  2. Lipitor 80 mg at hour sleep.  3. Lisinopril 10 mg daily.   PHYSICAL EXAMINATION:  GENERAL:  Pale male patient complaining of  weakness, nausea, and vomiting.  VITAL SIGNS:  Supine temperature 97.1; BP 118/60; pulse 80 and slightly  irregular; respirations 20; sitting BP 98/56, standing BP 90/50.  PSYCHIATRY:  The patient is alert and oriented x3.  His affect is  appropriate to current situation.  NEUROLOGIC:  Cranial nerves II through XII are grossly intact.  He is  moving all extremities x4.  When standing, he was somewhat unsteady on  his gait mildly.  CHEST:  Bilateral lung sounds are clear to auscultation.  Respiratory  effort is nonlabored, nontachypneic.  CARDIAC:  Heart sounds are S1 and S2, slightly irregular.  No rubs,  murmurs, thrills, or no gallops.  No tachycardia.  No peripheral edema.  ABDOMEN:  Obese, but soft.  Bowel sounds are present.  He is tender  mainly over an incision with manipulation.  There is seepy yellow and  tan drainage noted on the dressing and in the wound itself.  He has to  tracks at the top of the wound with a skin bridge connecting them and  there is also a tract more distal to the wound.  There is PDS suture  coming out through the middle opening  that connects the 2 upper tracts,  otherwise the tissue is pink and granular and healthy.  The skin tissue  around the wound is nonerythematous.  Cultures were obtained here in the  office.  EXTREMITIES:  Symmetrical in appearance without cyanosis or clubbing,  although, the patient's overall appearance is pale.   LABORATORY DATA:  Wound culture was obtained in the office today.  The  hospital wound culture was polymicrobial.   IMPRESSION:  1. Failure to thrive and dehydration.  2. Postoperative wound infection.  3. Known colonic carcinoid mass with 3/12 lymph nodes positive.  4. Diabetes mellitus, on metformin.  5. Hypertension, on lisinopril.  6. Dyslipidemia.   PLAN:  1. [name] has interviewed and examined the patient with me and      agrees that the patient needs to be admitted for the previously      mentioned diagnoses.  We will start IV fluids for hydration because      the patient has not been eating or has not been taking his      metformin, we will use dextrose formula initially.  2. We will follow up on the hospital cultures.  We will also check      blood culture.  In addition, we will check a BMET and CBC.  I am      somewhat concerned with the dehydration, given the fact the patient      is taking metformin with his lisinopril that he may have some      underlying renal insufficiency contributing to these issues.  3. The patient may or may not need an CT of the abdomen or the pelvis.      There is no foul smell to the drainage      and there does not appear to be any feculent drainage that would be      consistent with a possible fistula formation.  4. I will begin empiric antibiotic therapy with cefoxitin IV.      Allison L. Rennis Harding, N.P.      Adolph Pollack, [name].  Electronically Signed    ALE/MEDQ  D:  08/02/2008  T:  08/03/2008  Job:  [id number]

## 2011-01-22 NOTE — Op Note (Signed)
[name], [name]                 ACCOUNT NO.:  [id number]   MEDICAL RECORD NO.:  [id number]          PATIENT TYPE:  INP   LOCATION:  5151                         FACILITY:  [facility]   PHYSICIAN:  John C. Madilyn Fireman, [name].    DATE OF BIRTH:  03/29/1966   DATE OF PROCEDURE:  07/20/2008  DATE OF DISCHARGE:                               OPERATIVE REPORT   INDICATIONS FOR PROCEDURE:  Abdominal pain with some sort of ill-defined  thickening or mass associated with ascending colon, appendix, and  mesentery of the ascending colon.   PROCEDURE:  The patient was placed in the left lateral decubitus  position and placed on the pulse monitor with continuous low-flow oxygen  delivered by nasal cannula.  She was sedated with 75 mcg IV fentanyl and  7 mg IV Versed.  The Olympus video colonoscope was inserted into the  rectum and advanced its entire length.  The colon was long and  redundant, and with much effort, I was able to reach an area where from  a tangential view with the scope inserted its entire length, I could see  eccentric protrusion of the mucosa with course cobblestoned erythema and  edema.  It was not well seen in its entirety and I could not get the  scope to the proximal end of it or beyond it, and although I assume this  represented the lesion seen on CT scan.  I could not see any of the  cecal landmarks such as the appendiceal orifice or ileocecal valve  despite multiple position changes, abdominal pressures, and torquing  maneuvers.  I was able to at distance biopsy the abnormal-appearing  mucosa, which appeared soft and did not clearly seemed to be neoplastic  but was a definitely abnormal appearing.  The ascending colon distal to  it as well as the transverse descending, sigmoid, and rectum all  appeared normal with no other mucosal abnormalities seen.  The scope was  then withdrawn, and the patient returned to the recovery room in stable  condition.  He tolerated the procedure well.   There were no immediate  complications.   IMPRESSION:  Vaguely characterized incompletely visualized protruding  mucosal abnormality, presumably in the ascending colon could not reach  or identify the cecal landmarks.  Ashby Dawes of this lesion remains obscure  from endoscopic inspection.   PLAN:  We will go ahead and get a barium enema to better characterize  the extent and location of this lesion, await biopsy results although I  suspect it will be nondiagnostic, and given the correlation with his  abdominal pain, I suspect he will probably need a surgical consultation.           ______________________________  Everardo All Madilyn Fireman, [name].     JCH/MEDQ  D:  07/20/2008  T:  07/20/2008  Job:  [id number]

## 2011-01-22 NOTE — Consult Note (Signed)
NAMEUNDRAY, ALLMAN NO.:  [id number]   MEDICAL RECORD NO.:  [id number]          PATIENT TYPE:  INP   LOCATION:  5151                         FACILITY:  [facility]   PHYSICIAN:  Cherylynn Ridges, [name].    DATE OF BIRTH:  01-Sep-1966   DATE OF CONSULTATION:  DATE OF DISCHARGE:                                 CONSULTATION   Juan Pier, MD  [address]  [place], [place] [address]   Dear Dr. Olena Leatherwood:   Thank you very much for asking me to see [name] a 45 year old non-  insulin-dependent diabetic with hypertension and dyslipidemia who has 2-  week history of abdominal pain mostly on the right side unassociated  with nausea, vomiting, fevers, or chills.  The reason the patient came  in today to have this worked up is because he was on the side of town  anyway going to a court date and because he had been plagued with this  pain for the last couple of weeks, he decided to come into the hospital  where he went to urgent care and was subsequently sent to the ED where  he had a CT scan, which demonstrated a pericecal inflammation.  He was  in the hospital or in the ER for over 9 hours prior to initial  consultation with Surgery because of the CT findings and subsequently  the midday medicines likely represents nonoperative condition and we  will follow on consultation.   PAST MEDICAL HISTORY:  1. Hypertension.  2. Diabetes.  3. Dyslipidemia.   PAST SURGICAL HISTORY:  He only admits to having had an open  cholecystectomy for a ruptured gallbladder.   MEDICATIONS:  Unknown.   ALLERGIES:  No known drug allergies.  He does take medications for his  diabetes and for his hypertension.   SOCIAL HISTORY:  He is currently unemployed.  He does not smoke, does  not drink any alcohol, and takes no illicit drugs.   FAMILY HISTORY:  Noncontributory.   PHYSICAL EXAMINATION:  VITAL SIGNS:  On exam, he is afebrile with a  pulse of 105, blood pressure 127/87.  HEENT:  He is  normocephalic and atraumatic and anicteric.  NECK:  Supple.  CHEST:  Clear.  CARDIAC:  Regular rhythm and rate with no murmurs, gallops, rubs, or  heaves.  ABDOMEN:  Soft with bowel sounds.  He is tender in the lower portion of  the abdomen particularly on the right side without acute peritonitis.  RECTAL:  Not performed.   LABORATORY STUDIES:  His white count is 6500 without a left shift,  hemoglobin 15.8.  Potassium is 2.7, BUN 4, creatinine 0.72.  His CO2 is  29.  I reviewed the CT scan, which showed some cecal edema, some  pericecal inflammation, and some mesenteric adenitis or mesentery  adenopathy near the base of the cecum.   IMPRESSION:  This is a typhlitis of a typhl or cecal colitis, which is  not associated with the air or evidence of acute appendicitis by CT  scan.  This could be a smoldering appendicitis; however, with  the  patient minimally symptomatic, IV antibiotics are warranted at this  time.  There is no abscess associated with this and he does not have  diffuse peritonitis.   PLAN:  To treat him with IV antibiotics.  Currently, he is being treated  with Flagyl.  I would add additionally some Zosyn and/or ciprofloxacin  and then continue to follow the patient.      Cherylynn Ridges, [name].  Electronically Signed     JOW/MEDQ  D:  07/18/2008  T:  07/19/2008  Job:  [id number]

## 2011-01-22 NOTE — Discharge Summary (Signed)
NAMEFLOYD, [name] NO.:  [id number]   MEDICAL RECORD NO.:  [id number]          PATIENT TYPE:  INP   LOCATION:  5149                         FACILITY:  [facility]   PHYSICIAN:  [name], [name].DATE OF BIRTH:  September 23, 1965   DATE OF ADMISSION:  08/02/2008  DATE OF DISCHARGE:  08/06/2008                               DISCHARGE SUMMARY   CHIEF COMPLAINT AND REASON FOR ADMISSION:  Mr. [name] is, well known to  [name], 45 year old patient just discharged from [facility] several days  ago after undergoing a lap and hand-assisted right colectomy for an  neuroendocrine carcinoid tumor with 3 out of 12 lymph nodes positive.  Prior to discharge, the patient developed what appeared to be a wound  infection.  The wound was cultured, but initial cultures were negative.  The wound was opened and packed and the patient was afebrile and without  leukocytosis.  He was discharged home with plans to follow up in our  clinic on Tuesday to evaluate the wound given concerns that it may be an  evolving infection.  Since that time, the patient has done quite poorly.  He has been unable to eat or drink anything.  He has not been able to  keep p.o. Percocet down.  He was evaluated per myself in the Urgent Care  Clinic at [facility].  I asked [name] to look at  the patient with me and based on clinical findings, the patient appeared  to have an ongoing wound infection with failure to thrive and  dehydration.  On exam, his abdomen was soft.  He was tender over his  incision.  There was seepy yellow tan  drainage in the wound with some  purulence.  The wound was not completely open with a skin bridge making  a tract underneath the wound with some PDS suture noted.   ADMITTING DIAGNOSES:  1. Postoperative wound infection and open wound.  2. Failure to thrive and dehydration.  3. Known carcinoid with lymph nodes positive.  4. Diabetes, on metformin.  5. Hypertension, on  lisinopril.  6. Dyslipidemia.   HOSPITAL COURSE:  The patient was admitted to the general floor where he  was started on IV fluids, bowel rest, and empiric antibiotic therapy  with cefoxitin IV.  Initial x-ray showed possible regional ileus.  The  patient continued to have nausea and vomiting during the early portion  of the hospital stay.  The tissue bridge that was in the wound was  debrided away to allow for much larger wound and improved packing.  Cultures later showed polymicrobial status.  No significant bacterial  overgrowth.  The patient had significant hyponatremia with sodium into  125s related to hyperglycemia, nausea, vomiting, and dehydration on  admission. All his electrolytes were corrected.  The diet was slowly  advanced and by September 05, 2008, he was deemed appropriate for  discharge home.  He was afebrile.  His BP was 126/86 and pulse 92.  Abdomen was soft and nontender.  Bowel sounds were noted.  Wound was  essentially clean with small  amount of fibrin at the base.  No  purulence.  He was discharged to home on Avelox with home health RN to  assist with wound care and he is to follow up with [name] and/or  [name] in 1 week.   FINAL DISCHARGE DIAGNOSES:  1. Nausea, vomiting, dehydration, and failure to thrive with      associated hyponatremia and hypokalemia, resolved.  2. Open abdominal wound with infection, polymicrobial growth.  3. Diabetes.  4. Hypertension.   DISCHARGE MEDICATIONS:  1. Lisinopril 10 mg daily.  2. Metformin 500 mg b.i.d.  3. Reglan 10 mg t.i.d. as needed.  4. Protonix 40 mg b.i.d..  5. Crestor 40 mg at hour sleep  6. Albuterol 2 puffs q.6 h.  7. NovoLog insulin 11 units subcu t.i.d. before meals.  8. Lantus 12 units subcu at hour sleep.  9. Avelox 400 mg once daily.  10.Oxycodone/APAP 1-2 tabs every 6 hours as needed for pain.   ADDITIONAL INSTRUCTIONS:  Home health assist with wound care twice  daily.   FOLLOWUP  APPOINTMENTS:  She need to call [name] to be seen in 1  week or see [name].   ACTIVITY:  May shower.  No lifting greater than 10 pounds for 4 weeks.      Allison L. Rennis Harding, N.P.      Gabrielle Dare Janee Morn, [name].  Electronically Signed    ALE/MEDQ  D:  09/29/2008  T:  09/30/2008  Job:  [id number]   cc:   Adolph Pollack, [name].  Lennie Muckle, MD
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
## 2011-01-25 NOTE — Discharge Summary (Signed)
NAMEPRITESH, [name] NO.:  [id number]   MEDICAL RECORD NO.:  [id number]          PATIENT TYPE:  INP   LOCATION:  1527                         FACILITY:  [facility]   PHYSICIAN:  Lennie Muckle, MD      DATE OF BIRTH:  1966-05-06   DATE OF ADMISSION:  02/10/2009  DATE OF DISCHARGE:  02/14/2009                               DISCHARGE SUMMARY   HOSPITAL COURSE:  Mr. Bachar is a 45 year old male who underwent  laparoscopic incisional hernia repair on February 10, 2009.  Postoperatively,  he had a mild ileus.  He also had issues with pain.  He was initially  started on IV morphine and was transferred over to Percocet.  He had  some emesis, but was able to tolerate a regular diet on day of  discharge.  He did have some abdominal distention.  He had appropriate  tenderness.  There was no evidence of infection of the skin sites.  He  had management of his diabetes with insulin preoperative  and  postoperative.  He was started on all his home medicines.  He was  ambulating without difficulty.  He was somewhat hesitant to ambulate.  He was able to get up and ambulate the day of discharge without  difficulty.  He was given MiraLax prior to discharge for constipation.   DISCHARGE INSTRUCTIONS:  1. He was instructed to perform no heavy lifting over 20 pounds for 6      weeks.  2. Follow up with me in approximately 2-3 weeks.  3. Continue on all of his home medicines.  4. He was started on Percocet for pain and discharged with Percocet      for pain.   CONDITION ON DISCHARGE:  Improved.      Lennie Muckle, MD  Electronically Signed     ALA/MEDQ  D:  03/01/2009  T:  03/01/2009  Job:  [id number]   cc:   Melvern Banker  Fax: [phone]

## 2011-03-28 ENCOUNTER — Encounter: Payer: Self-pay | Admitting: *Deleted

## 2011-04-05 ENCOUNTER — Emergency Department (HOSPITAL_COMMUNITY)
Admission: EM | Admit: 2011-04-05 | Discharge: 2011-04-05 | Disposition: A | Discharge status: Home / Self Care | Payer: Medicare Other | Attending: Emergency Medicine | Admitting: Emergency Medicine

## 2011-04-05 DIAGNOSIS — E119 Type 2 diabetes mellitus without complications: Secondary | ICD-10-CM | POA: Insufficient documentation

## 2011-04-05 DIAGNOSIS — B350 Tinea barbae and tinea capitis: Secondary | ICD-10-CM | POA: Insufficient documentation

## 2011-04-05 DIAGNOSIS — I10 Essential (primary) hypertension: Secondary | ICD-10-CM | POA: Insufficient documentation

## 2011-04-05 DIAGNOSIS — IMO0002 Reserved for concepts with insufficient information to code with codable children: Secondary | ICD-10-CM | POA: Insufficient documentation

## 2011-04-05 DIAGNOSIS — E785 Hyperlipidemia, unspecified: Secondary | ICD-10-CM | POA: Insufficient documentation

## 2011-04-13 ENCOUNTER — Other Ambulatory Visit: Payer: Self-pay | Admitting: Physician Assistant

## 2011-04-23 ENCOUNTER — Other Ambulatory Visit: Payer: Self-pay | Admitting: Oncology

## 2011-04-23 ENCOUNTER — Encounter (HOSPITAL_BASED_OUTPATIENT_CLINIC_OR_DEPARTMENT_OTHER): Payer: Medicare Other | Admitting: Oncology

## 2011-04-23 DIAGNOSIS — C189 Malignant neoplasm of colon, unspecified: Secondary | ICD-10-CM

## 2011-04-23 DIAGNOSIS — C7A012 Malignant carcinoid tumor of the ileum: Secondary | ICD-10-CM

## 2011-04-23 DIAGNOSIS — J069 Acute upper respiratory infection, unspecified: Secondary | ICD-10-CM

## 2011-04-23 LAB — CBC WITH DIFFERENTIAL/PLATELET
Basophils Absolute: 0 10*3/uL (ref 0.0–0.1)
HCT: 45 % (ref 38.4–49.9)
HGB: 15.9 g/dL (ref 13.0–17.1)
MONO#: 0.2 10*3/uL (ref 0.1–0.9)
NEUT%: 43.1 % (ref 39.0–75.0)
WBC: 4.7 10*3/uL (ref 4.0–10.3)
lymph#: 2.2 10*3/uL (ref 0.9–3.3)

## 2011-04-23 LAB — COMPREHENSIVE METABOLIC PANEL
BUN: 9 mg/dL (ref 6–23)
CO2: 19 mEq/L (ref 19–32)
Calcium: 9.5 mg/dL (ref 8.4–10.5)
Chloride: 98 mEq/L (ref 96–112)
Creatinine, Ser: 0.72 mg/dL (ref 0.50–1.35)
Glucose, Bld: 384 mg/dL — ABNORMAL HIGH (ref 70–99)

## 2011-04-23 LAB — LACTATE DEHYDROGENASE: LDH: 111 U/L (ref 94–250)

## 2011-06-11 LAB — CBC
HCT: 37.1 % — ABNORMAL LOW (ref 39.0–52.0)
HCT: 34.4 — ABNORMAL LOW
HCT: 36.3 — ABNORMAL LOW
HCT: 39.5
HCT: 39.5
HCT: 39.7
HCT: 46.6
Hemoglobin: 13.6 g/dL (ref 13.0–17.0)
Hemoglobin: 12.7 — ABNORMAL LOW
Hemoglobin: 12.7 — ABNORMAL LOW
Hemoglobin: 13.2
Hemoglobin: 13.6
Hemoglobin: 15.8
MCHC: 34.5 g/dL (ref 30.0–36.0)
MCHC: 33.9
MCHC: 34.3
MCHC: 34.3
MCHC: 35.2
MCHC: 35.3
MCV: 92.7 fL (ref 78.0–100.0)
MCV: 93.3
MCV: 94
MCV: 94.2
MCV: 95.4
MCV: 95.5
MCV: 96.5
Platelets: 270 10*3/uL (ref 150–400)
Platelets: 297 10*3/uL (ref 150–400)
Platelets: 326 10*3/uL (ref 150–400)
Platelets: 177
Platelets: 227
Platelets: 227
Platelets: 299
RBC: 4.04 MIL/uL — ABNORMAL LOW (ref 4.22–5.81)
RBC: 3.61 — ABNORMAL LOW
RBC: 4.03 — ABNORMAL LOW
RBC: 4.1 — ABNORMAL LOW
RBC: 4.22
RDW: 12.3 % (ref 11.5–15.5)
RDW: 12.6 % (ref 11.5–15.5)
RDW: 12.4
RDW: 12.6
RDW: 12.6
RDW: 12.6
WBC: 4.4
WBC: 5
WBC: 5.8
WBC: 7.6

## 2011-06-11 LAB — COMPREHENSIVE METABOLIC PANEL
ALT: 19
Albumin: 2.3 — ABNORMAL LOW
Alkaline Phosphatase: 55
BUN: 4 — ABNORMAL LOW
BUN: 7
CO2: 22
CO2: 25
Calcium: 8.7
Calcium: 9.1
Chloride: 107
Creatinine, Ser: 0.72
Creatinine, Ser: 0.79
Creatinine, Ser: 0.84
GFR calc non Af Amer: >60
GFR calc non Af Amer: >60
Glucose, Bld: 132 — ABNORMAL HIGH
Glucose, Bld: 163 — ABNORMAL HIGH
Glucose, Bld: 196 — ABNORMAL HIGH
Sodium: 135
Total Bilirubin: 0.8
Total Protein: 6.8

## 2011-06-11 LAB — GLUCOSE, CAPILLARY
Glucose-Capillary: 110 mg/dL — ABNORMAL HIGH (ref 70–99)
Glucose-Capillary: 127 mg/dL — ABNORMAL HIGH (ref 70–99)
Glucose-Capillary: 136 mg/dL — ABNORMAL HIGH (ref 70–99)
Glucose-Capillary: 139 mg/dL — ABNORMAL HIGH (ref 70–99)
Glucose-Capillary: 143 mg/dL — ABNORMAL HIGH (ref 70–99)
Glucose-Capillary: 173 mg/dL — ABNORMAL HIGH (ref 70–99)
Glucose-Capillary: 174 mg/dL — ABNORMAL HIGH (ref 70–99)
Glucose-Capillary: 107 — ABNORMAL HIGH
Glucose-Capillary: 121 — ABNORMAL HIGH
Glucose-Capillary: 122 — ABNORMAL HIGH
Glucose-Capillary: 132 — ABNORMAL HIGH
Glucose-Capillary: 136 — ABNORMAL HIGH
Glucose-Capillary: 139 — ABNORMAL HIGH
Glucose-Capillary: 139 — ABNORMAL HIGH
Glucose-Capillary: 147 — ABNORMAL HIGH
Glucose-Capillary: 148 — ABNORMAL HIGH
Glucose-Capillary: 150 — ABNORMAL HIGH
Glucose-Capillary: 151 — ABNORMAL HIGH
Glucose-Capillary: 152 — ABNORMAL HIGH
Glucose-Capillary: 157 — ABNORMAL HIGH
Glucose-Capillary: 159 — ABNORMAL HIGH
Glucose-Capillary: 160 — ABNORMAL HIGH
Glucose-Capillary: 161 — ABNORMAL HIGH
Glucose-Capillary: 165 — ABNORMAL HIGH
Glucose-Capillary: 176 — ABNORMAL HIGH
Glucose-Capillary: 180 — ABNORMAL HIGH
Glucose-Capillary: 184 — ABNORMAL HIGH
Glucose-Capillary: 191 — ABNORMAL HIGH
Glucose-Capillary: 233 — ABNORMAL HIGH

## 2011-06-11 LAB — URINALYSIS, ROUTINE W REFLEX MICROSCOPIC
Glucose, UA: 500 — AB
Glucose, UA: NEGATIVE
Hgb urine dipstick: NEGATIVE
Protein, ur: NEGATIVE
Protein, ur: NEGATIVE
Specific Gravity, Urine: 1.015
Urobilinogen, UA: 1

## 2011-06-11 LAB — BASIC METABOLIC PANEL
BUN: 11 mg/dL (ref 6–23)
BUN: 12 mg/dL (ref 6–23)
BUN: 3 mg/dL — ABNORMAL LOW (ref 6–23)
BUN: 4 mg/dL — ABNORMAL LOW (ref 6–23)
BUN: 1 — ABNORMAL LOW
BUN: 2 — ABNORMAL LOW
BUN: 4 — ABNORMAL LOW
BUN: 5 — ABNORMAL LOW
BUN: <1 — ABNORMAL LOW
CO2: 27 mEq/L (ref 19–32)
CO2: 27 mEq/L (ref 19–32)
CO2: 29 mEq/L (ref 19–32)
CO2: 21
CO2: 22
CO2: 24
Calcium: 8.7 mg/dL (ref 8.4–10.5)
Calcium: 8.9 mg/dL (ref 8.4–10.5)
Calcium: 8.6
Calcium: 8.7
Calcium: 9
Chloride: 103 mEq/L (ref 96–112)
Chloride: 91 mEq/L — ABNORMAL LOW (ref 96–112)
Chloride: 103
Chloride: 114 — ABNORMAL HIGH
Chloride: 97
Creatinine, Ser: 0.67 mg/dL (ref 0.4–1.5)
Creatinine, Ser: 0.83 mg/dL (ref 0.4–1.5)
Creatinine, Ser: 0.6
Creatinine, Ser: 0.64
Creatinine, Ser: 0.74
Creatinine, Ser: 0.79
GFR calc Af Amer: >60 mL/min (ref >60)
GFR calc Af Amer: >60
GFR calc Af Amer: >60
GFR calc Af Amer: >60
GFR calc Af Amer: >60
GFR calc Af Amer: >60
GFR calc non Af Amer: 56 mL/min — ABNORMAL LOW (ref >60)
GFR calc non Af Amer: >60
GFR calc non Af Amer: >60
GFR calc non Af Amer: >60
GFR calc non Af Amer: >60
GFR calc non Af Amer: >60
Glucose, Bld: 141 mg/dL — ABNORMAL HIGH (ref 70–99)
Glucose, Bld: 168 mg/dL — ABNORMAL HIGH (ref 70–99)
Glucose, Bld: 177 mg/dL — ABNORMAL HIGH (ref 70–99)
Glucose, Bld: 134 — ABNORMAL HIGH
Glucose, Bld: 138 — ABNORMAL HIGH
Glucose, Bld: 139 — ABNORMAL HIGH
Glucose, Bld: 153 — ABNORMAL HIGH
Potassium: 2.9 mEq/L — ABNORMAL LOW (ref 3.5–5.1)
Potassium: 3 — ABNORMAL LOW
Potassium: 3.1 — ABNORMAL LOW
Potassium: 3.4 — ABNORMAL LOW
Potassium: 3.6
Potassium: 4.1
Sodium: 129 mEq/L — ABNORMAL LOW (ref 135–145)
Sodium: 135
Sodium: 137
Sodium: 139
Sodium: 140
Sodium: 142

## 2011-06-11 LAB — TYPE AND SCREEN
ABO/RH(D): A POS
Antibody Screen: NEGATIVE

## 2011-06-11 LAB — CULTURE, BLOOD (ROUTINE X 2)
Culture: NO GROWTH
Culture: NO GROWTH

## 2011-06-11 LAB — URINE CULTURE: Colony Count: NO GROWTH

## 2011-06-11 LAB — POCT URINALYSIS DIP (DEVICE)
Hgb urine dipstick: NEGATIVE
Nitrite: NEGATIVE
Protein, ur: 30 — AB
Specific Gravity, Urine: 1.02
Urobilinogen, UA: 1
pH: 5.5

## 2011-06-11 LAB — MAGNESIUM
Magnesium: 1.8
Magnesium: 2

## 2011-06-11 LAB — DIFFERENTIAL
Basophils Absolute: 0
Eosinophils Absolute: 0.1
Lymphocytes Relative: 13
Lymphocytes Relative: 40
Lymphs Abs: 0.9
Lymphs Abs: 2.6
Monocytes Relative: 7
Neutro Abs: 3.1
Neutrophils Relative %: 48
Neutrophils Relative %: 78 — ABNORMAL HIGH

## 2011-06-11 LAB — WOUND CULTURE

## 2011-06-11 LAB — URINE MICROSCOPIC-ADD ON

## 2011-06-11 LAB — LIPASE, BLOOD: Lipase: 24

## 2011-06-11 LAB — PROTIME-INR
INR: 1.3
Prothrombin Time: 16.5 — ABNORMAL HIGH

## 2011-06-11 LAB — APTT: aPTT: 31

## 2011-06-11 LAB — HEMOGLOBIN A1C: Hgb A1c MFr Bld: 9 — ABNORMAL HIGH

## 2011-07-30 ENCOUNTER — Inpatient Hospital Stay (HOSPITAL_COMMUNITY): Admission: RE | Admit: 2011-07-30 | Payer: Medicare Other | Source: Ambulatory Visit

## 2011-08-09 ENCOUNTER — Ambulatory Visit: Payer: Medicare Other | Admitting: Oncology

## 2011-08-09 ENCOUNTER — Other Ambulatory Visit: Payer: Medicare Other | Admitting: Lab

## 2011-10-14 DIAGNOSIS — I10 Essential (primary) hypertension: Secondary | ICD-10-CM | POA: Diagnosis not present

## 2011-10-14 DIAGNOSIS — E785 Hyperlipidemia, unspecified: Secondary | ICD-10-CM | POA: Diagnosis not present

## 2011-10-14 DIAGNOSIS — IMO0001 Reserved for inherently not codable concepts without codable children: Secondary | ICD-10-CM | POA: Diagnosis not present

## 2011-10-28 ENCOUNTER — Telehealth: Payer: Self-pay | Admitting: Oncology

## 2011-10-28 ENCOUNTER — Other Ambulatory Visit: Payer: Self-pay | Admitting: *Deleted

## 2011-10-28 ENCOUNTER — Encounter: Payer: Self-pay | Admitting: Oncology

## 2011-10-28 ENCOUNTER — Ambulatory Visit (HOSPITAL_BASED_OUTPATIENT_CLINIC_OR_DEPARTMENT_OTHER): Payer: Medicare Other | Admitting: Oncology

## 2011-10-28 ENCOUNTER — Other Ambulatory Visit: Payer: Medicare Other | Admitting: Lab

## 2011-10-28 VITALS — BP 115/77 | HR 85 | Temp 98.6°F | Ht 65.0 in | Wt 254.2 lb

## 2011-10-28 DIAGNOSIS — C7B8 Other secondary neuroendocrine tumors: Secondary | ICD-10-CM

## 2011-10-28 DIAGNOSIS — C9200 Acute myeloblastic leukemia, not having achieved remission: Secondary | ICD-10-CM | POA: Diagnosis not present

## 2011-10-28 LAB — CBC WITH DIFFERENTIAL/PLATELET
BASO%: 0.4 % (ref 0.0–2.0)
EOS%: 4.4 % (ref 0.0–7.0)
LYMPH%: 48.4 % (ref 14.0–49.0)
MCH: 36.1 pg — ABNORMAL HIGH (ref 27.2–33.4)
MCHC: 35.8 g/dL (ref 32.0–36.0)
MCV: 100.9 fL — ABNORMAL HIGH (ref 79.3–98.0)
MONO%: 4.9 % (ref 0.0–14.0)
NEUT#: 1.7 10*3/uL (ref 1.5–6.5)
Platelets: 147 10*3/uL (ref 140–400)
RBC: 4.3 10*6/uL (ref 4.20–5.82)
RDW: 13.2 % (ref 11.0–14.6)

## 2011-10-28 LAB — COMPREHENSIVE METABOLIC PANEL
ALT: 26 U/L (ref 0–53)
AST: 23 U/L (ref 0–37)
Albumin: 4 g/dL (ref 3.5–5.2)
Alkaline Phosphatase: 66 U/L (ref 39–117)
Glucose, Bld: 402 mg/dL — ABNORMAL HIGH (ref 70–99)
Potassium: 3.6 mEq/L (ref 3.5–5.3)
Sodium: 135 mEq/L (ref 135–145)
Total Bilirubin: 0.5 mg/dL (ref 0.3–1.2)
Total Protein: 6.7 g/dL (ref 6.0–8.3)

## 2011-10-28 NOTE — Patient Instructions (Signed)
1. You are doing well, no evidence of recurrence of your disease. 2. Recommend that you exercise and lose weight 3. Do not smoke 4. We will see back in the office in 1 year with blood work

## 2011-10-28 NOTE — Telephone Encounter (Signed)
gve the pt his feb 2014 appt calendar °

## 2011-10-28 NOTE — Progress Notes (Signed)
OFFICE PROGRESS NOTE  CC  MULBERRY,ELIZABETH, MD, MD [facility] [phone] [place] [address]  DIAGNOSIS: 46 year old with neuroendocrine carcinoid tumor of the ileum diagnosed 07/2005  PRIOR THERAPY: 1. S/P ileum resection in 07/2005 for neuroendocrine carcinoid 2. Observation for the carcinoid  CURRENT THERAPY:observation  INTERVAL HISTORY: [name] 46 y.o. male returns for follow up visit. Overall he is doing well. He is accompanied by his mother. He has fatigue and has gained weight. No abdominal pain, no flushing or diarrhea. No fevers or chills no nausea or vomiting. No headaches, does complain of little bit shortness of breath, complains of not being able to get enough wind and so has to have his car windows down to help breath better. Denies any bleeding. Remainder of the 10 point review of systems is negative.  MEDICAL HISTORY: Past Medical History  Diagnosis Date  . Cancer carcinoid  . Anxiety   . Diabetes mellitus   . Hypertension     ALLERGIES:   has no known allergies.  MEDICATIONS:  Current Outpatient Prescriptions  Medication Sig Dispense Refill  . famotidine (PEPCID) 20 MG tablet Take 20 mg by mouth 2 (two) times daily.      [name] ibuprofen (ADVIL,MOTRIN) 200 MG tablet Take 200 mg by mouth every 8 (eight) hours as needed.      . insulin glargine (LANTUS) 100 UNIT/ML injection Inject into the skin at bedtime. 30 units      . metFORMIN (GLUCOPHAGE) 1000 MG tablet Take 1,000 mg by mouth 2 (two) times daily with a meal.      . rosuvastatin (CRESTOR) 20 MG tablet Take 20 mg by mouth daily.      [name] omeprazole (PRILOSEC) 40 MG capsule TAKE ONE CAPSULE BY MOUTH EVERY DAY  30 capsule  3    SURGICAL HISTORY:  Past Surgical History  Procedure Date  . Cholecystectomy   . Small intestine surgery   . Hernia repair     REVIEW OF SYSTEMS:  Pertinent items are noted in HPI.   PHYSICAL EXAMINATION: General appearance: alert, cooperative, appears  stated age, distracted, fatigued, pale and slowed mentation Neck: no adenopathy, no carotid bruit, no JVD, supple, symmetrical, trachea midline and thyroid not enlarged, symmetric, no tenderness/mass/nodules Lymph nodes: Cervical, supraclavicular, and axillary nodes normal. Resp: clear to auscultation bilaterally and normal percussion bilaterally Back: symmetric, no curvature. ROM normal. No CVA tenderness. Cardio: regular rate and rhythm, S1, S2 normal, no murmur, click, rub or gallop GI: abdomen protuberant with incisional hernia that is reducible. no HSM Extremities: extremities normal, atraumatic, no cyanosis or edema Neurologic: Alert and oriented X 3, normal strength and tone. Normal symmetric reflexes. Normal coordination and gait  ECOG PERFORMANCE STATUS: 1 - Symptomatic but completely ambulatory  Blood pressure 115/77, pulse 85, temperature 98.6 F (37 C), temperature source Oral, height 5\' 5"  (1.651 m), weight 254 lb 3.2 oz (115.304 kg).  LABORATORY DATA: Lab Results  Component Value Date   WBC 4.2 10/28/2011   HGB 15.5 10/28/2011   HCT 43.4 10/28/2011   MCV 100.9* 10/28/2011   PLT 147 10/28/2011      Chemistry      Component Value Date/Time   NA 132* 04/23/2011 0927   NA 131 10/26/2009 1008   K 4.1 04/23/2011 0927   K 3.9 10/26/2009 1008   CL 98 04/23/2011 0927   CL 100 10/26/2009 1008   CO2 19 04/23/2011 0927   CO2 28 10/26/2009 1008   BUN  9 04/23/2011 0927   BUN 10 10/26/2009 1008   CREATININE 0.72 04/23/2011 0927   CREATININE 0.7 10/26/2009 1008      Component Value Date/Time   CALCIUM 9.5 04/23/2011 0927   CALCIUM 9.0 10/26/2009 1008   ALKPHOS 62 04/23/2011 0927   ALKPHOS 86* 10/26/2009 1008   AST 24 04/23/2011 0927   AST 32 10/26/2009 1008   ALT 32 04/23/2011 0927   BILITOT 0.4 04/23/2011 0927   BILITOT 0.80 10/26/2009 1008       RADIOGRAPHIC STUDIES:  No results found.  ASSESSMENT: 46 year old male with   1. Neuroendocrine carcinoid of the ileum s/p resection of  the ileum in 07/2005. No clinical evidence of recurrence. Patient is over 5 years out since diagnosis and surgery   PLAN:  1. Follow up on a yearly basis with labs   All questions were answered. The patient knows to call the clinic with any problems, questions or concerns. We can certainly see the patient much sooner if necessary.  I spent 20 minutes counseling the patient face to face. The total time spent in the appointment was 30 minutes.    Drue Second, MD Medical/Oncology [facility] [phone] (beeper) [phone] (Office)  10/28/2011, 9:52 AM

## 2011-10-30 ENCOUNTER — Ambulatory Visit: Payer: Medicare Other

## 2011-10-30 DIAGNOSIS — C7A012 Malignant carcinoid tumor of the ileum: Secondary | ICD-10-CM | POA: Diagnosis not present

## 2011-11-05 LAB — 5 HIAA, QUANTITATIVE, URINE, 24 HOUR: Volume, Urine-5HIAA: 850 mL/24 h

## 2012-02-13 ENCOUNTER — Other Ambulatory Visit (HOSPITAL_COMMUNITY): Payer: Self-pay | Admitting: Internal Medicine

## 2012-02-13 DIAGNOSIS — E785 Hyperlipidemia, unspecified: Secondary | ICD-10-CM | POA: Diagnosis not present

## 2012-02-13 DIAGNOSIS — R52 Pain, unspecified: Secondary | ICD-10-CM | POA: Diagnosis not present

## 2012-02-13 DIAGNOSIS — R109 Unspecified abdominal pain: Secondary | ICD-10-CM

## 2012-02-18 ENCOUNTER — Ambulatory Visit (HOSPITAL_COMMUNITY)
Admission: RE | Admit: 2012-02-18 | Discharge: 2012-02-18 | Disposition: A | Discharge status: Home / Self Care | Payer: Medicare Other | Source: Ambulatory Visit | Attending: Internal Medicine | Admitting: Internal Medicine

## 2012-02-18 DIAGNOSIS — Z85038 Personal history of other malignant neoplasm of large intestine: Secondary | ICD-10-CM | POA: Diagnosis not present

## 2012-02-18 DIAGNOSIS — R1084 Generalized abdominal pain: Secondary | ICD-10-CM | POA: Insufficient documentation

## 2012-02-18 DIAGNOSIS — R109 Unspecified abdominal pain: Secondary | ICD-10-CM

## 2012-02-18 DIAGNOSIS — Z9089 Acquired absence of other organs: Secondary | ICD-10-CM | POA: Diagnosis not present

## 2012-02-18 MED ORDER — IOHEXOL 300 MG/ML  SOLN: 80.0000 mL | Freq: Once | INTRAMUSCULAR | Status: AC | PRN

## 2012-03-16 ENCOUNTER — Encounter: Payer: Self-pay | Admitting: Gastroenterology

## 2012-03-17 ENCOUNTER — Encounter: Payer: Medicare Other | Attending: Internal Medicine | Admitting: *Deleted

## 2012-03-17 ENCOUNTER — Encounter: Payer: Self-pay | Admitting: *Deleted

## 2012-03-17 VITALS — Ht 71.0 in | Wt 249.3 lb

## 2012-03-17 DIAGNOSIS — E119 Type 2 diabetes mellitus without complications: Secondary | ICD-10-CM | POA: Diagnosis not present

## 2012-03-17 DIAGNOSIS — Z713 Dietary counseling and surveillance: Secondary | ICD-10-CM | POA: Diagnosis not present
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
## 2012-03-17 NOTE — Patient Instructions (Addendum)
Plan: Aim for adding activity to each day by walking Aim for checking BG more often as directed by MD to get more info on diabetes control and to see benefit of exercise

## 2012-03-25 ENCOUNTER — Encounter: Payer: Self-pay | Admitting: Gastroenterology

## 2012-03-25 ENCOUNTER — Ambulatory Visit (INDEPENDENT_AMBULATORY_CARE_PROVIDER_SITE_OTHER): Payer: Medicare Other | Admitting: Gastroenterology

## 2012-03-25 VITALS — BP 110/78 | HR 82 | Ht 65.0 in | Wt 246.6 lb

## 2012-03-25 DIAGNOSIS — R112 Nausea with vomiting, unspecified: Secondary | ICD-10-CM

## 2012-03-25 DIAGNOSIS — K219 Gastro-esophageal reflux disease without esophagitis: Secondary | ICD-10-CM

## 2012-03-25 DIAGNOSIS — Z859 Personal history of malignant neoplasm, unspecified: Secondary | ICD-10-CM | POA: Diagnosis not present

## 2012-03-25 NOTE — Patient Instructions (Addendum)

## 2012-03-25 NOTE — Assessment & Plan Note (Addendum)
Postprandial nausea and vomiting may be due to gastroparesis, ulcer or nonulcer dyspepsia. Partial small bowel obstruction is less likely in view of the recent negative CT scan.  Recommendations #1 upper endoscopy #2 gastric emptying scan if #1 is negative

## 2012-03-25 NOTE — Progress Notes (Signed)
History of Present Illness: 46 year old white male with history of carcinoid tumor referred at the request of Dr. Delrae Alfred for evaluation of nausea and vomiting. In 2009 he underwent resection of the distal ileum and right colon for a carcinoid tumor. Tumor invaded through the muscularis propria into the subserosa. 3 of 12 lymph nodes were positive for metastatic disease. He received no followup therapy. The patient has a long history of GI issues dating back to childhood where he would vomit after eating certain foods including various vegetables. For the past 2 months he's been experiencing intermittent postprandial vomiting within an hour of eating out. This is regardless of type of food intake. He denies abdominal pain. He has pyrosis. He's lost about 20 pounds. He moves his bowels regularly.  He takes omeprazole regularly.  CT scan one month ago was negative.    Past Medical History  Diagnosis Date  . Cancer carcinoid  . Anxiety   . Diabetes mellitus   . Hypertension   . GERD (gastroesophageal reflux disease)   . Ventral hernia   . Hyperlipemia   . UTI (lower urinary tract infection)   . Colon cancer    Past Surgical History  Procedure Date  . Cholecystectomy   . Small intestine surgery   . Hernia repair   . Colon surgery     2009   family history includes Cancer (age of onset:66) in his mother. Current Outpatient Prescriptions  Medication Sig Dispense Refill  . famotidine (PEPCID) 20 MG tablet Take 20 mg by mouth 2 (two) times daily.      [name] ibuprofen (ADVIL,MOTRIN) 200 MG tablet Take 200 mg by mouth every 8 (eight) hours as needed.      . insulin glargine (LANTUS) 100 UNIT/ML injection Inject into the skin at bedtime. 30 units      . metFORMIN (GLUCOPHAGE) 1000 MG tablet Take 1,000 mg by mouth 2 (two) times daily with a meal.      . rosuvastatin (CRESTOR) 20 MG tablet Take 20 mg by mouth daily.      [name] omeprazole (PRILOSEC) 40 MG capsule TAKE ONE CAPSULE BY MOUTH EVERY DAY  30  capsule  3   Allergies as of 03/25/2012  . (No Known Allergies)    reports that he has never smoked. He has never used smokeless tobacco. He reports that he does not drink alcohol or use illicit drugs.     Review of Systems: He complains of low back pain Pertinent positive and negative review of systems were noted in the above HPI section. All other review of systems were otherwise negative.  Vital signs were reviewed in today's medical record Physical Exam: General: Well developed , well nourished, no acute distress Head: Normocephalic and atraumatic Eyes:  sclerae anicteric, EOMI Ears: Normal auditory acuity Mouth: No deformity or lesions Neck: Supple, no masses or thyromegaly Lungs: Clear throughout to auscultation Heart: Regular rate and rhythm; no murmurs, rubs or bruits Abdomen: Soft, non tender and non distended. No masses, hepatosplenomegaly or hernias noted. Normal Bowel sounds. There is no succussion splash Rectal:deferred Musculoskeletal: Symmetrical with no gross deformities  Skin: No lesions on visible extremities Pulses:  Normal pulses noted Extremities: No clubbing, cyanosis, edema or deformities noted Neurological: Alert oriented x 4, grossly nonfocal Cervical Nodes:  No significant cervical adenopathy Inguinal Nodes: No significant inguinal adenopathy Psychological:  Alert and cooperative. Normal mood and affect

## 2012-04-14 ENCOUNTER — Encounter: Payer: Medicare Other | Attending: Internal Medicine | Admitting: *Deleted

## 2012-04-14 ENCOUNTER — Encounter: Payer: Self-pay | Admitting: *Deleted

## 2012-04-14 VITALS — Ht 71.0 in | Wt 249.1 lb

## 2012-04-14 DIAGNOSIS — E119 Type 2 diabetes mellitus without complications: Secondary | ICD-10-CM | POA: Insufficient documentation

## 2012-04-14 DIAGNOSIS — Z713 Dietary counseling and surveillance: Secondary | ICD-10-CM | POA: Insufficient documentation
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
## 2012-04-14 NOTE — Patient Instructions (Signed)
Plan: Start taking Lantus every day about the same time because it works for 24 hours. (you decided to take it at 7AM) Consider letting alcohol dry on your skin so the needle won't sting Consider using an ice cube before the needle so it won't bleed Choose a way to be active every day to help lower your blood sugar Be aware that foods like starches and sugar have carbohydrate so they will affect your blood sugar more than meat or vegetables. Try and spread starches and sugar more evenly throughout the day so your blood sugars will be better.

## 2012-04-16 ENCOUNTER — Ambulatory Visit (AMBULATORY_SURGERY_CENTER): Payer: Medicare Other | Admitting: Gastroenterology

## 2012-04-16 ENCOUNTER — Other Ambulatory Visit: Payer: Self-pay | Admitting: Gastroenterology

## 2012-04-16 ENCOUNTER — Encounter: Payer: Self-pay | Admitting: Gastroenterology

## 2012-04-16 VITALS — BP 141/100 | HR 75 | Temp 98.0°F | Resp 20 | Ht 65.0 in | Wt 246.0 lb

## 2012-04-16 DIAGNOSIS — K219 Gastro-esophageal reflux disease without esophagitis: Secondary | ICD-10-CM

## 2012-04-16 DIAGNOSIS — K2970 Gastritis, unspecified, without bleeding: Secondary | ICD-10-CM

## 2012-04-16 DIAGNOSIS — K2990 Gastroduodenitis, unspecified, without bleeding: Secondary | ICD-10-CM

## 2012-04-16 DIAGNOSIS — R109 Unspecified abdominal pain: Secondary | ICD-10-CM

## 2012-04-16 DIAGNOSIS — R112 Nausea with vomiting, unspecified: Secondary | ICD-10-CM

## 2012-04-16 DIAGNOSIS — Z859 Personal history of malignant neoplasm, unspecified: Secondary | ICD-10-CM

## 2012-04-16 MED ORDER — SODIUM CHLORIDE 0.9 % IV SOLN: 500.0000 mL | INTRAVENOUS | Status: DC

## 2012-04-16 NOTE — Progress Notes (Signed)
Patient did not experience any of the following events: a burn prior to discharge; a fall within the facility; wrong site/side/patient/procedure/implant event; or a hospital transfer or hospital admission upon discharge from the facility. (G8907) Patient did not have preoperative order for IV antibiotic SSI prophylaxis. (G8918)  

## 2012-04-16 NOTE — Op Note (Signed)
[facility] [address] [place], [place]  [address]  ENDOSCOPY PROCEDURE REPORT  PATIENT:  [name], [name]  MR#:  [id number] BIRTHDATE:  1966-05-18, 45 yrs. old  GENDER:  male  ENDOSCOPIST:  Barbette Hair. Arlyce Dice, MD Referred by:  Julieanne Manson, [name].  PROCEDURE DATE:  04/16/2012 PROCEDURE:  EGD, diagnostic 43235 ASA CLASS:  Class II INDICATIONS:  nausea and vomiting  MEDICATIONS:   MAC sedation, administered by CRNA propofol 140mg IV, glycopyrrolate (Robinal) 0.2 mg IV, 0.6cc simethancone 0.6 cc PO TOPICAL ANESTHETIC:  DESCRIPTION OF PROCEDURE:   After the risks and benefits of the procedure were explained, informed consent was obtained.  The LB GIF-H180 [id number] endoscope was introduced through the mouth and advanced to the third portion of the duodenum.  The instrument was slowly withdrawn as the mucosa was fully examined. <<PROCEDUREIMAGES>>  Mild gastritis was found. Diffuse spotty erythema throughout the stomach. Bxs taken (see image3).  Otherwise the examination was normal (see image2, image4, image5, and image6).    Retroflexed views revealed no abnormalities.    The scope was then withdrawn from the patient and the procedure completed.  COMPLICATIONS:  None  ENDOSCOPIC IMPRESSION: 1) Mild gastritis 2) Otherwise normal examination RECOMMENDATIONS: 1) My office will arrange for you to have a Gastric Emptying Scan performed. This is a radiology test that gives an idea of how well your stomach functions. 2) Await biopsy results  ______________________________ Barbette Hair. Arlyce Dice, MD  CC:  n. eSIGNED:   Barbette Hair. Kaplan at 04/16/2012 03:49 PM  [place], [phone]

## 2012-04-16 NOTE — Patient Instructions (Addendum)
YOU HAD AN ENDOSCOPIC PROCEDURE TODAY AT THE [facility]: Refer to the procedure report that was given to you for any specific questions about what was found during the examination.  If the procedure report does not answer your questions, please call your gastroenterologist to clarify.  If you requested that your care partner not be given the details of your procedure findings, then the procedure report has been included in a sealed envelope for you to review at your convenience later.  YOU SHOULD EXPECT: Some feelings of bloating in the abdomen. Passage of more gas than usual.  Walking can help get rid of the air that was put into your GI tract during the procedure and reduce the bloating. If you had a lower endoscopy (such as a colonoscopy or flexible sigmoidoscopy) you may notice spotting of blood in your stool or on the toilet paper. If you underwent a bowel prep for your procedure, then you may not have a normal bowel movement for a few days.  DIET: Your first meal following the procedure should be a light meal and then it is ok to progress to your normal diet.  A half-sandwich or bowl of soup is an example of a good first meal.  Heavy or fried foods are harder to digest and may make you feel nauseous or bloated.  Likewise meals heavy in dairy and vegetables can cause extra gas to form and this can also increase the bloating.  Drink plenty of fluids but you should avoid alcoholic beverages for 24 hours.  ACTIVITY: Your care partner should take you home directly after the procedure.  You should plan to take it easy, moving slowly for the rest of the day.  You can resume normal activity the day after the procedure however you should NOT DRIVE or use heavy machinery for 24 hours (because of the sedation medicines used during the test).    SYMPTOMS TO REPORT IMMEDIATELY: A gastroenterologist can be reached at any hour.  During normal business hours, 8:30 AM to 5:00 PM Monday through Friday,  call [phone].  After hours and on weekends, please call the GI answering service at [phone] who will take a message and have the physician on call contact you.   Following lower endoscopy (colonoscopy or flexible sigmoidoscopy):  Excessive amounts of blood in the stool  Significant tenderness or worsening of abdominal pains  Swelling of the abdomen that is new, acute  Fever of 100F or higher  Following upper endoscopy (EGD)  Vomiting of blood or coffee ground material  New chest pain or pain under the shoulder blades  Painful or persistently difficult swallowing  New shortness of breath  Fever of 100F or higher  Black, tarry-looking stools  FOLLOW UP: If any biopsies were taken you will be contacted by phone or by letter within the next 1-3 weeks.  Call your gastroenterologist if you have not heard about the biopsies in 3 weeks.  Our staff will call the home number listed on your records the next business day following your procedure to check on you and address any questions or concerns that you may have at that time regarding the information given to you following your procedure. This is a courtesy call and so if there is no answer at the home number and we have not heard from you through the emergency physician on call, we will assume that you have returned to your regular daily activities without incident.  SIGNATURES/CONFIDENTIALITY: You and/or your care   partner have signed paperwork which will be entered into your electronic medical record.  These signatures attest to the fact that that the information above on your After Visit Summary has been reviewed and is understood.  Full responsibility of the confidentiality of this discharge information lies with you and/or your care-partner.  

## 2012-04-17 ENCOUNTER — Telehealth: Payer: Self-pay | Admitting: *Deleted

## 2012-04-17 ENCOUNTER — Telehealth: Payer: Self-pay

## 2012-04-17 NOTE — Telephone Encounter (Signed)
  Follow up Call-  Call back number 04/16/2012  Post procedure Call Back phone  # [phone]  Permission to leave phone message Yes     Patient questions:  Do you have a fever, pain , or abdominal swelling? no Pain Score  0 *  Have you tolerated food without any problems? yes  Have you been able to return to your normal activities? yes  Do you have any questions about your discharge instructions: Diet   no Medications  no Follow up visit  no  Do you have questions or concerns about your Care? no  Actions: * If pain score is 4 or above: No action needed, pain <4. Pt states that he has a cough since procedure yesterday, no dilation performed yesterday.. Is just a dry hacky cough that he didn't have prior to the procedure he states. Pt instructed to try salt water gargles, increase fluids and may try any OTC cough suppressant of choice and to notify us or his PCP if cough doesn't improve or worsens. Pt verbalized instructions. ewm

## 2012-04-17 NOTE — Telephone Encounter (Signed)
Pt scheduled for [facility] 04/28/12 @[facility]  arrival time 9:45am for a 10am appt. Pt to be NPO after midnight and hold his stomach meds. Pt aware of appt date and time.

## 2012-04-28 ENCOUNTER — Encounter (HOSPITAL_COMMUNITY): Payer: Self-pay

## 2012-04-28 ENCOUNTER — Encounter (HOSPITAL_COMMUNITY)
Admission: RE | Admit: 2012-04-28 | Discharge: 2012-04-28 | Disposition: A | Discharge status: Home / Self Care | Payer: Medicare Other | Source: Ambulatory Visit | Attending: Gastroenterology | Admitting: Gastroenterology

## 2012-04-28 DIAGNOSIS — R112 Nausea with vomiting, unspecified: Secondary | ICD-10-CM | POA: Insufficient documentation

## 2012-04-28 DIAGNOSIS — R109 Unspecified abdominal pain: Secondary | ICD-10-CM | POA: Insufficient documentation

## 2012-04-28 DIAGNOSIS — E119 Type 2 diabetes mellitus without complications: Secondary | ICD-10-CM | POA: Insufficient documentation

## 2012-04-28 MED ORDER — TECHNETIUM TC 99M SULFUR COLLOID
2.2000 | Freq: Once | INTRAVENOUS | Status: AC | PRN
  Administered 2012-04-28: 2.2 via ORAL

## 2012-04-29 ENCOUNTER — Telehealth: Payer: Self-pay | Admitting: Gastroenterology

## 2012-04-29 NOTE — Telephone Encounter (Signed)
Let pt know Dr. Arlyce Dice is out of town and results have not been reviewed. Dr. Arlyce Dice will return on Monday. Pt will call back next week for results.

## 2012-05-04 ENCOUNTER — Encounter: Payer: Self-pay | Admitting: Gastroenterology

## 2012-05-05 ENCOUNTER — Telehealth: Payer: Self-pay | Admitting: *Deleted

## 2012-05-05 MED ORDER — METOCLOPRAMIDE HCL 10 MG PO TABS: 10.0000 mg | ORAL_TABLET | Freq: Four times a day (QID) | ORAL | Status: DC

## 2012-05-05 NOTE — Telephone Encounter (Signed)
See result note.  

## 2012-05-05 NOTE — Telephone Encounter (Signed)
Calling for results of Gastric emptying scan. Please advise.

## 2012-05-19 ENCOUNTER — Encounter: Payer: Self-pay | Admitting: *Deleted

## 2012-05-19 ENCOUNTER — Encounter: Payer: Medicare Other | Attending: Internal Medicine | Admitting: *Deleted

## 2012-05-19 VITALS — Ht 71.0 in | Wt 247.5 lb

## 2012-05-19 DIAGNOSIS — E119 Type 2 diabetes mellitus without complications: Secondary | ICD-10-CM

## 2012-05-19 DIAGNOSIS — Z713 Dietary counseling and surveillance: Secondary | ICD-10-CM | POA: Diagnosis not present
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
## 2012-05-19 NOTE — Patient Instructions (Addendum)
Plan: Continue taking Lantus every day about the same time because it works for 24 hours. (you decided to take it at 7AM) Continue letting alcohol dry on your skin so the needle won't sting Consider using an ice cube before the needle so it won't bleed Plan to prepare dinner meal on Tuesdays and wash dishes on Friday nights. Continue to be aware that foods like starches and sugar have carbohydrate so they will affect your blood sugar more than meat or vegetables. Try and spread starches and sugar more evenly throughout the day so your blood sugars will be consistent.

## 2012-05-27 ENCOUNTER — Encounter: Payer: Self-pay | Admitting: Gastroenterology

## 2012-05-27 ENCOUNTER — Ambulatory Visit (INDEPENDENT_AMBULATORY_CARE_PROVIDER_SITE_OTHER): Payer: Medicare Other | Admitting: Gastroenterology

## 2012-05-27 VITALS — BP 102/70 | HR 80 | Ht 69.0 in | Wt 247.0 lb

## 2012-05-27 DIAGNOSIS — K3184 Gastroparesis: Secondary | ICD-10-CM

## 2012-05-27 NOTE — Assessment & Plan Note (Signed)
This undoubtedly is responsible for the patient's nausea and vomiting. Reglan may be causing excess somnolence.  Recommendations #1 decrease Reglan to 5 mg one half hour a.c. and at bedtime; if excess sleepiness continues then I will try erythromycin  The patient was instructed to  contact me immediately  if she develops any side effects from her Reglan including paresthesias, tremors, confusion , weakness or muscle spasms.

## 2012-05-27 NOTE — Patient Instructions (Addendum)
Reduce your Reglan to 5 mg. Four times daily or as needed.  Follow up with Dr. Arlyce Dice in 3 months.    Contact me immediately  if you develop any side effects from her Reglan including paresthesias, tremors, confusion , weakness or muscle spasms.

## 2012-05-27 NOTE — Progress Notes (Signed)
History of Present Illness:  Mr. Veasley has returned following upper endoscopy and gastric emptying scan. The former was normal. The latter demonstrated marked gastroparesis. On Reglan nausea has entirely subsided. His main complaint is sleepiness which he attributes to Reglan.    Review of Systems: Pertinent positive and negative review of systems were noted in the above HPI section. All other review of systems were otherwise negative.    Current Medications, Allergies, Past Medical History, Past Surgical History, Family History and Social History were reviewed in [employer] electronic medical record  Vital signs were reviewed in today's medical record. Physical Exam: General: Well developed , well nourished, no acute distress

## 2012-06-04 DIAGNOSIS — E119 Type 2 diabetes mellitus without complications: Secondary | ICD-10-CM | POA: Diagnosis not present

## 2012-06-04 DIAGNOSIS — I10 Essential (primary) hypertension: Secondary | ICD-10-CM | POA: Diagnosis not present

## 2012-06-04 DIAGNOSIS — E785 Hyperlipidemia, unspecified: Secondary | ICD-10-CM | POA: Diagnosis not present

## 2012-06-04 DIAGNOSIS — E109 Type 1 diabetes mellitus without complications: Secondary | ICD-10-CM | POA: Diagnosis not present

## 2012-06-10 ENCOUNTER — Ambulatory Visit
Admission: RE | Admit: 2012-06-10 | Discharge: 2012-06-10 | Disposition: A | Discharge status: Home / Self Care | Payer: Medicare Other | Source: Ambulatory Visit | Attending: Family Medicine | Admitting: Family Medicine

## 2012-06-10 ENCOUNTER — Other Ambulatory Visit: Payer: Self-pay | Admitting: Family Medicine

## 2012-06-10 DIAGNOSIS — M25539 Pain in unspecified wrist: Secondary | ICD-10-CM

## 2012-06-11 ENCOUNTER — Encounter: Payer: Self-pay | Admitting: *Deleted

## 2012-06-11 ENCOUNTER — Encounter: Payer: Medicare Other | Attending: Internal Medicine | Admitting: *Deleted

## 2012-06-11 VITALS — Ht 71.0 in | Wt 245.1 lb

## 2012-06-11 DIAGNOSIS — Z713 Dietary counseling and surveillance: Secondary | ICD-10-CM | POA: Insufficient documentation

## 2012-06-11 DIAGNOSIS — E119 Type 2 diabetes mellitus without complications: Secondary | ICD-10-CM | POA: Insufficient documentation

## 2012-06-11 NOTE — Patient Instructions (Addendum)
Plan: Continue taking Lantus every day about the same time because it works for 24 hours. (you decided to take it at 7AM) Continue to prepare dinner meal on Tuesdays and consider washing dishes on Friday nights. Continue to be aware that foods like starches and sugar have carbohydrate so they will affect your blood sugar more than meat or vegetables. Try       and spread starches and sugar more evenly throughout the day so your blood sugars will be consistent.  *Check BG twice a day (before breakfast and 2 hours after supper) for next several days and bring Log Sheet to MD on Monday for his review. *Ask MD on Monday about allowing you to increase Lantus dose per protocol to bring Fasting BG down into target range of 100-150 mg/dl

## 2012-06-11 NOTE — Progress Notes (Signed)
  Medical Nutrition Therapy:  Appt start time: 0815 end time:  0845.  Assessment:  Primary concerns today: patient here with mother again this visit. He has received new Accu Chek Aviva Meter with Accu Drum Lancing Device and is here to learn how to use it. He and his mother confirms that he has started preparing dinner meal about once a week and is also starting to vacuum and take on other duties around the house. He states he is willing to check his BG twice daily to provide his MD with data to be able to make insulin dose decisions for him. He also states he is able to eat more variety of foods and his attitude appears more receptive to suggestions for his care. Weight loss of 2 pounds also noted.  MEDICATIONS: see list. Current diabetes medications include metformin and Lantus   DIETARY INTAKE:  Usual eating pattern includes 1-3 meals and 1-3 snacks per day.  Everyday foods include easy to prepare foods and sweetened drinks.  Avoided foods include dairy milk, .    Usual physical activity: mowing some and working outside as able.  Estimated energy needs: 1800 calories 200 g carbohydrates 135 g protein 50 g fat  Progress Towards Goal(s):  In progress.   Nutritional Diagnosis:  NB-1.1 Food and nutrition-related knowledge deficit As related to diabetes.  As evidenced by A1c of 10.5%.    Intervention: Instructed him on use of new Accu Chek Aviva meter and Accu Drum lancing device. His BG today at 8:30 AM was 439 mg/dl. He states he ate a sandwich around 4 AM and did take his Lantus this AM. I commended him for taking his Lantus more consistently and that by providing his MD with BG data, the insulin dose could be adjusted more accurately for him. He has a regularly scheduled follow up visit with me next Tuesday after the MD appointment on Monday, so we will pursue more education on Carb Counting and whether he will be given permission by MD to adjust his Lantus dose to improve management of  his BG.  Plan: Continue taking Lantus every day about the same time because it works for 24 hours. (you decided to take it at 7AM) Continue to prepare dinner meal on Tuesdays and consider washing dishes on Friday nights. Continue to be aware that foods like starches and sugar have carbohydrate so they will affect your blood sugar more than meat or vegetables. Try       and spread starches and sugar more evenly throughout the day so your blood sugars will be consistent.  *Check BG twice a day (before breakfast and 2 hours after supper) for next several days and bring Log Sheet to MD on Monday for his review. *Ask MD on Monday about allowing you to increase Lantus dose per protocol to bring Fasting BG down into target range of 100-150 mg/dl  Monitoring/Evaluation:  Dietary intake, exercise, SMBG, and body weight in 1 week.

## 2012-06-16 ENCOUNTER — Encounter: Payer: Medicare Other | Admitting: *Deleted

## 2012-06-16 ENCOUNTER — Encounter: Payer: Self-pay | Admitting: *Deleted

## 2012-06-16 DIAGNOSIS — E119 Type 2 diabetes mellitus without complications: Secondary | ICD-10-CM | POA: Diagnosis not present

## 2012-06-16 DIAGNOSIS — Z713 Dietary counseling and surveillance: Secondary | ICD-10-CM | POA: Diagnosis not present

## 2012-06-16 NOTE — Progress Notes (Signed)
  Medical Nutrition Therapy:  Appt start time: 1100 end time:  1130.  Assessment: Patient here for follow up visit for diabetes management. He has resumed checking his BG now that he has a new meter and knows how to use it.  He brought his meter today and the history indicates he has checked his BG every AM with the results being between 300 and 395 consistently. He reports he is taking the Lantus @ 30 units each AM. He is showing more responsibility by continuing to prepare Tuesday night supper for his parents in addition to improved diabetes management mentioned above. He also states he is able to eat more variety of foods than before as his stomach is doing better. He chose not to weigh today as we weighed him last week. His next MD appointment is this Friday, Oct 11th.  MEDICATIONS: see list. Current diabetes medications include metformin and Lantus   DIETARY INTAKE:  Usual eating pattern includes 1-3 meals and 1-3 snacks per day.  Everyday foods include easy to prepare foods and sweetened drinks.  Avoided foods include dairy milk, .    Usual physical activity: mowing some and working outside as able.  Estimated energy needs: 1800 calories 200 g carbohydrates 135 g protein 50 g fat  Progress Towards Goal(s):  In progress.   Nutritional Diagnosis:  NB-1.1 Food and nutrition-related knowledge deficit As related to diabetes.  As evidenced by A1c of 10.5%.    Intervention: Complimented him on the improved diabetes management behaviors; SMBG daily for past week, taking Lantus more consistently each AM, and being involved with meal preparation at home (even if it is only one night a week). Reviewed his BG in the history of his meter and discussed rationale of taking small increase of Lantus to bring these down into the 200's prior to seeing MD the end of this week. Also reviewed Carb Counting in terms of which food groups have carb and which ones do not. Suggested he start identifying these  foods to increase his awareness so we can proceed with actual carb counting in the future.  Plan: Consider increasing Lantus from 30 units to 35 units each day, continue taking it in the morning  (you decided to take it at 7AM) Continue to prepare dinner meal on Tuesdays and consider washing dishes on Friday nights. Continue to be aware that foods like starches and sugar have carbohydrate so they will affect your blood sugar more than meat or vegetables. Try       and look at your foods and think about which ones count as a carb choice and which ones don't. *Check BG once or twice a day (before breakfast and 2 hours after supper) for next several days and bring Log Sheet to MD on Friday for his review. *Ask MD on Friday about allowing you to increase Lantus dose per protocol to bring Fasting BG down into target range of 100-150 mg/dl  Monitoring/Evaluation:  Dietary intake, exercise, SMBG, and body weight in 4 weeks.

## 2012-06-16 NOTE — Patient Instructions (Signed)
Plan: Consider increasing Lantus from 30 units to 35 units each day, continue taking it in the morning  (you decided to take it at 7AM) Continue to prepare dinner meal on Tuesdays and consider washing dishes on Friday nights. Continue to be aware that foods like starches and sugar have carbohydrate so they will affect your blood sugar more than meat or vegetables. Try       and look at your foods and think about which ones count as a carb choice and which ones don't. *Check BG once or twice a day (before breakfast and 2 hours after supper) for next several days and bring Log Sheet to MD on Friday for his review. *Ask MD on Friday about allowing you to increase Lantus dose per protocol to bring Fasting BG down into target range of 100-150 mg/dl

## 2012-06-19 DIAGNOSIS — E119 Type 2 diabetes mellitus without complications: Secondary | ICD-10-CM | POA: Diagnosis not present

## 2012-07-14 ENCOUNTER — Ambulatory Visit: Payer: Medicare Other | Admitting: *Deleted

## 2012-08-19 ENCOUNTER — Other Ambulatory Visit: Payer: Self-pay | Admitting: *Deleted

## 2012-08-24 ENCOUNTER — Encounter: Payer: Self-pay | Admitting: Gastroenterology

## 2012-08-24 ENCOUNTER — Ambulatory Visit (INDEPENDENT_AMBULATORY_CARE_PROVIDER_SITE_OTHER): Payer: Medicare Other | Admitting: Gastroenterology

## 2012-08-24 VITALS — BP 116/80 | HR 92 | Ht 69.0 in | Wt 242.0 lb

## 2012-08-24 DIAGNOSIS — K3184 Gastroparesis: Secondary | ICD-10-CM

## 2012-08-24 DIAGNOSIS — Z859 Personal history of malignant neoplasm, unspecified: Secondary | ICD-10-CM

## 2012-08-24 MED ORDER — ERYTHROMYCIN BASE 250 MG PO TABS: 250.0000 mg | ORAL_TABLET | Freq: Four times a day (QID) | ORAL | Status: DC

## 2012-08-24 NOTE — Assessment & Plan Note (Signed)
Plan followup colonoscopy 

## 2012-08-24 NOTE — Assessment & Plan Note (Signed)
Excessive somnolence with Reglan  Recommendations #1 DC Reglan; trial of erythromycin 250 mg 4 times a day

## 2012-08-24 NOTE — Patient Instructions (Addendum)
You have been scheduled for a colonoscopy with propofol. Please follow written instructions given to you at your visit today.  Please pick up your prep kit at the pharmacy within the next 1-3 days. If you use inhalers (even only as needed) or a CPAP machine, please bring them with you on the day of your procedure.  Discontinue Reglan We are sending in a prescription to your pharmacy

## 2012-08-24 NOTE — Progress Notes (Signed)
History of Present Illness:  The patient has returned for followup of nausea. He has gastroparesis which has responded well to Reglan. Despite lowering Reglan to 5 mg, he continues to complain of excessive somnolence.  Patient has a history of a malignant carcinoid involving the terminal ileum and right colon. This was resected in 2009. He is unaware whether he's had colonoscopy before or since. He has no lower GI complaints.    Review of Systems: Pertinent positive and negative review of systems were noted in the above HPI section. All other review of systems were otherwise negative.    Current Medications, Allergies, Past Medical History, Past Surgical History, Family History and Social History were reviewed in [employer] electronic medical record  Vital signs were reviewed in today's medical record. Physical Exam: General: Well developed , well nourished, no acute distress

## 2012-08-25 ENCOUNTER — Ambulatory Visit (AMBULATORY_SURGERY_CENTER): Payer: Medicare Other | Admitting: Gastroenterology

## 2012-08-25 ENCOUNTER — Encounter: Payer: Self-pay | Admitting: Gastroenterology

## 2012-08-25 VITALS — BP 120/83 | HR 82 | Temp 97.7°F | Resp 12 | Ht 69.0 in | Wt 242.0 lb

## 2012-08-25 DIAGNOSIS — Z859 Personal history of malignant neoplasm, unspecified: Secondary | ICD-10-CM

## 2012-08-25 DIAGNOSIS — Z860100 Personal history of colon polyps, unspecified: Secondary | ICD-10-CM

## 2012-08-25 DIAGNOSIS — Z1211 Encounter for screening for malignant neoplasm of colon: Secondary | ICD-10-CM | POA: Diagnosis not present

## 2012-08-25 LAB — GLUCOSE, CAPILLARY
Glucose-Capillary: 310 mg/dL — ABNORMAL HIGH (ref 70–99)
Glucose-Capillary: 262 mg/dL — ABNORMAL HIGH (ref 70–99)

## 2012-08-25 MED ORDER — SODIUM CHLORIDE 0.9 % IV SOLN: 500.0000 mL | INTRAVENOUS | Status: DC

## 2012-08-25 MED ORDER — INSULIN REGULAR HUMAN 100 UNIT/ML IJ SOLN: 5.0000 [IU] | Freq: Once | INTRAMUSCULAR | Status: DC

## 2012-08-25 NOTE — Progress Notes (Signed)
Patient did not experience any of the following events: a burn prior to discharge; a fall within the facility; wrong site/side/patient/procedure/implant event; or a hospital transfer or hospital admission upon discharge from the facility. (G8907) Patient did not have preoperative order for IV antibiotic SSI prophylaxis. (G8918)  

## 2012-08-25 NOTE — Progress Notes (Signed)
Pt stable to pacu

## 2012-08-25 NOTE — Patient Instructions (Addendum)
YOU HAD AN ENDOSCOPIC PROCEDURE TODAY AT THE [facility]: Refer to the procedure report that was given to you for any specific questions about what was found during the examination.  If the procedure report does not answer your questions, please call your gastroenterologist to clarify.  If you requested that your care partner not be given the details of your procedure findings, then the procedure report has been included in a sealed envelope for you to review at your convenience later.  YOU SHOULD EXPECT: Some feelings of bloating in the abdomen. Passage of more gas than usual.  Walking can help get rid of the air that was put into your GI tract during the procedure and reduce the bloating. If you had a lower endoscopy (such as a colonoscopy or flexible sigmoidoscopy) you may notice spotting of blood in your stool or on the toilet paper. If you underwent a bowel prep for your procedure, then you may not have a normal bowel movement for a few days.  DIET: Your first meal following the procedure should be a light meal and then it is ok to progress to your normal diet.  A half-sandwich or bowl of soup is an example of a good first meal.  Heavy or fried foods are harder to digest and may make you feel nauseous or bloated.  Likewise meals heavy in dairy and vegetables can cause extra gas to form and this can also increase the bloating.  Drink plenty of fluids but you should avoid alcoholic beverages for 24 hours.  ACTIVITY: Your care partner should take you home directly after the procedure.  You should plan to take it easy, moving slowly for the rest of the day.  You can resume normal activity the day after the procedure however you should NOT DRIVE or use heavy machinery for 24 hours (because of the sedation medicines used during the test).    SYMPTOMS TO REPORT IMMEDIATELY: A gastroenterologist can be reached at any hour.  During normal business hours, 8:30 AM to 5:00 PM Monday through Friday,  call [phone].  After hours and on weekends, please call the GI answering service at [phone] who will take a message and have the physician on call contact you.   Following lower endoscopy (colonoscopy or flexible sigmoidoscopy):  Excessive amounts of blood in the stool  Significant tenderness or worsening of abdominal pains  Swelling of the abdomen that is new, acute  Fever of 100F or higher   FOLLOW UP:.  Our staff will call the home number listed on your records the next business day following your procedure to check on you and address any questions or concerns that you may have at that time regarding the information given to you following your procedure. This is a courtesy call and so if there is no answer at the home number and we have not heard from you through the emergency physician on call, we will assume that you have returned to your regular daily activities without incident.  SIGNATURES/CONFIDENTIALITY: You and/or your care partner have signed paperwork which will be entered into your electronic medical record.  These signatures attest to the fact that that the information above on your After Visit Summary has been reviewed and is understood.  Full responsibility of the confidentiality of this discharge information lies with you and/or your care-partner.  Continue your normal medications  Next colonoscopy in 10 years 

## 2012-08-25 NOTE — Op Note (Addendum)
[facility] [address] [place], [address]   COLONOSCOPY PROCEDURE REPORT  PATIENT: Juan, [name]  MR#: [id number] BIRTHDATE: 1965/09/11 , 46  yrs. old GENDER: Male ENDOSCOPIST: Louis Meckel, MD REFERRED VH:QIONGE Tanya Nones, [name]. PROCEDURE DATE:  08/25/2012 PROCEDURE:   Colonoscopy, diagnostic ASA CLASS:   Class II INDICATIONS:High risk patient with personal history of familial polyposis and history of malignant carcinoid of the terminal ileum.  MEDICATIONS: MAC sedation, administered by CRNA and propofol (Diprivan) 300mg  IV  DESCRIPTION OF PROCEDURE:   After the risks benefits and alternatives of the procedure were thoroughly explained, informed consent was obtained.  A digital rectal exam revealed no abnormalities of the rectum.   The LB [id number]  endoscope was introduced through the anus and advanced to the surgical anastomosis. No adverse events experienced.   Limited by poor preparation.   The quality of the prep was Suprep fair  There was a significant amount of liquid stool. The instrument was then slowly withdrawn as the colon was fully examined.      COLON FINDINGS: A normal appearing cecum, ileocecal valve, and appendiceal orifice were identified.  The ascending, hepatic flexure, transverse, splenic flexure, descending, sigmoid colon and rectum appeared unremarkable.  No polyps or cancers were seen. Retroflexed views revealed no abnormalities. The time to cecum=6 minutes 09 seconds.  Withdrawal time=7 minutes 25 seconds.  The scope was withdrawn and the procedure completed. COMPLICATIONS: There were no complications.  ENDOSCOPIC IMPRESSION: Normal colon  RECOMMENDATIONS: Colonoscopy 10 years   eSigned:  Louis Meckel, MD 08/25/2012 3:19 PM Revised: 08/25/2012 3:19 PM  cc:

## 2012-08-26 ENCOUNTER — Telehealth: Payer: Self-pay | Admitting: *Deleted

## 2012-08-26 NOTE — Telephone Encounter (Signed)
  Follow up Call-  Call back number 08/25/2012 04/16/2012  Post procedure Call Back phone  # [phone]  Permission to leave phone message Yes Yes     Patient questions:  Do you have a fever, pain , or abdominal swelling? no Pain Score  0 *  Have you tolerated food without any problems? no  Have you been able to return to your normal activities? yes  Do you have any questions about your discharge instructions: Diet   no Medications  no Follow up visit  no  Do you have questions or concerns about your Care? no  Actions: * If pain score is 4 or above: No action needed, pain <4.  Pt. Stated that he "threw up" yesterday on the way home.  Had not arisen yet this AM, so he has not eaten. Advised to call office if he was not able to tolerate diet, developed fever, pain, or abdominal distention.

## 2012-08-27 DIAGNOSIS — E119 Type 2 diabetes mellitus without complications: Secondary | ICD-10-CM | POA: Diagnosis not present

## 2012-11-02 ENCOUNTER — Telehealth: Payer: Self-pay | Admitting: Oncology

## 2012-11-02 ENCOUNTER — Ambulatory Visit (HOSPITAL_BASED_OUTPATIENT_CLINIC_OR_DEPARTMENT_OTHER): Payer: Medicare Other | Admitting: Oncology

## 2012-11-02 ENCOUNTER — Encounter: Payer: Self-pay | Admitting: Oncology

## 2012-11-02 ENCOUNTER — Other Ambulatory Visit (HOSPITAL_BASED_OUTPATIENT_CLINIC_OR_DEPARTMENT_OTHER): Payer: Medicare Other | Admitting: Lab

## 2012-11-02 VITALS — BP 121/75 | HR 83 | Temp 98.3°F | Resp 20 | Ht 69.0 in | Wt 248.1 lb

## 2012-11-02 DIAGNOSIS — Z859 Personal history of malignant neoplasm, unspecified: Secondary | ICD-10-CM | POA: Diagnosis not present

## 2012-11-02 DIAGNOSIS — C7B8 Other secondary neuroendocrine tumors: Secondary | ICD-10-CM

## 2012-11-02 LAB — COMPREHENSIVE METABOLIC PANEL (CC13)
ALT: 36 U/L (ref 0–55)
AST: 32 U/L (ref 5–34)
Albumin: 3.7 g/dL (ref 3.5–5.0)
BUN: 3.5 mg/dL — ABNORMAL LOW (ref 7.0–26.0)
CO2: 25 mEq/L (ref 22–29)
Calcium: 9.2 mg/dL (ref 8.4–10.4)
Chloride: 100 mEq/L (ref 98–107)
Potassium: 3.5 mEq/L (ref 3.5–5.1)

## 2012-11-02 LAB — CBC WITH DIFFERENTIAL/PLATELET
BASO%: 0.8 % (ref 0.0–2.0)
Basophils Absolute: 0 10*3/uL (ref 0.0–0.1)
EOS%: 3.8 % (ref 0.0–7.0)
HCT: 45 % (ref 38.4–49.9)
HGB: 16.1 g/dL (ref 13.0–17.1)
MCH: 34 pg — ABNORMAL HIGH (ref 27.2–33.4)
MONO#: 0.3 10*3/uL (ref 0.1–0.9)
RDW: 13.3 % (ref 11.0–14.6)
WBC: 4.7 10*3/uL (ref 4.0–10.3)
lymph#: 2.2 10*3/uL (ref 0.9–3.3)

## 2012-11-02 NOTE — Progress Notes (Signed)
OFFICE PROGRESS NOTE  CC  PICKARD,WARREN TOM, MD [address] [place] [address]  DIAGNOSIS: 47 year old with neuroendocrine carcinoid tumor of the ileum diagnosed 07/2005  PRIOR THERAPY: 1. S/P ileum resection in 07/2005 for neuroendocrine carcinoid 2. Observation for the carcinoid  CURRENT THERAPY:observation  INTERVAL HISTORY: [name] 47 y.o. [name] returns for follow up visit. Overall he is doing well. He is accompanied by his mother. He has fatigue. No abdominal pain, no flushing or diarrhea. No fevers or chills no nausea or vomiting. No headaches, does complain of little bit shortness of breath, complains of not being able to get enough wind and so has to have his car windows down to help breath better. Denies any bleeding. Remainder of the 10 point review of systems is negative.  MEDICAL HISTORY: Past Medical History  Diagnosis Date  . Cancer carcinoid  . Diabetes mellitus   . Hypertension   . GERD (gastroesophageal reflux disease)   . Ventral hernia   . Hyperlipemia   . UTI (lower urinary tract infection)   . Colon cancer   . Anxiety     PT. DENIES    ALLERGIES:  has No Known Allergies.  MEDICATIONS:  Current Outpatient Prescriptions  Medication Sig Dispense Refill  . famotidine (PEPCID) 20 MG tablet       . insulin glargine (LANTUS) 100 UNIT/ML injection Inject into the skin every morning. 30 units       . lisinopril (PRINIVIL,ZESTRIL) 10 MG tablet 10 mg daily.       . metFORMIN (GLUCOPHAGE) 1000 MG tablet Take 1,000 mg by mouth 2 (two) times daily with a meal.      . metoCLOPramide (REGLAN) 10 MG tablet Take 10 mg by mouth 4 (four) times daily.      . rosuvastatin (CRESTOR) 20 MG tablet Take 20 mg by mouth daily.      [name] ibuprofen (ADVIL,MOTRIN) 200 MG tablet Take 200 mg by mouth every 8 (eight) hours as needed.       No current facility-administered medications for this visit.    SURGICAL HISTORY:  Past Surgical History  Procedure  Laterality Date  . Cholecystectomy    . Small intestine surgery    . Hernia repair    . Colon surgery      2009    REVIEW OF SYSTEMS:  Pertinent items are noted in HPI.   PHYSICAL EXAMINATION: General appearance: alert, cooperative, appears stated age, distracted, fatigued, pale and slowed mentation Neck: no adenopathy, no carotid bruit, no JVD, supple, symmetrical, trachea midline and thyroid not enlarged, symmetric, no tenderness/mass/nodules Lymph nodes: Cervical, supraclavicular, and axillary nodes normal. Resp: clear to auscultation bilaterally and normal percussion bilaterally Back: symmetric, no curvature. ROM normal. No CVA tenderness. Cardio: regular rate and rhythm, S1, S2 normal, no murmur, click, rub or gallop GI: abdomen protuberant with incisional hernia that is reducible. no HSM Extremities: extremities normal, atraumatic, no cyanosis or edema Neurologic: Alert and oriented X 3, normal strength and tone. Normal symmetric reflexes. Normal coordination and gait  ECOG PERFORMANCE STATUS: 1 - Symptomatic but completely ambulatory  Blood pressure 121/75, pulse 83, temperature 98.3 F (36.8 C), temperature source Oral, resp. rate 20, height 5\' 9"  (1.753 m), weight 248 lb 1.6 oz (112.537 kg).  LABORATORY DATA: Lab Results  Component Value Date   WBC 4.7 11/02/2012   HGB 16.1 11/02/2012   HCT 45.0 11/02/2012   MCV 95.0 11/02/2012   PLT 159 11/02/2012  Chemistry      Component Value Date/Time   NA 135 10/28/2011 0904   NA 131 10/26/2009 1008   K 3.6 10/28/2011 0904   K 3.9 10/26/2009 1008   CL 100 10/28/2011 0904   CL 100 10/26/2009 1008   CO2 18* 10/28/2011 0904   CO2 28 10/26/2009 1008   BUN 6 10/28/2011 0904   BUN 10 10/26/2009 1008   CREATININE 0.66 10/28/2011 0904   CREATININE 0.7 10/26/2009 1008      Component Value Date/Time   CALCIUM 8.9 10/28/2011 0904   CALCIUM 9.0 10/26/2009 1008   ALKPHOS 66 10/28/2011 0904   ALKPHOS 86* 10/26/2009 1008   AST 23 10/28/2011 0904    AST 32 10/26/2009 1008   ALT 26 10/28/2011 0904   BILITOT 0.5 10/28/2011 0904   BILITOT 0.80 10/26/2009 1008       RADIOGRAPHIC STUDIES:  No results found.  ASSESSMENT: 47 year old [name] with   1. Neuroendocrine carcinoid of the ileum s/p resection of the ileum in 07/2005. No clinical evidence of recurrence. Patient is over 5 years out since diagnosis and surgery   PLAN:  1. Follow up on a yearly basis with labs   All questions were answered. The patient knows to call the clinic with any problems, questions or concerns. We can certainly see the patient much sooner if necessary.  I spent 20 minutes counseling the patient face to face. The total time spent in the appointment was 30 minutes.    Drue Second, MD Medical/Oncology [facility] [phone] (beeper) [phone] (Office)  11/02/2012, 10:23 AM

## 2012-11-02 NOTE — Patient Instructions (Addendum)
Doing well I want you to exercise and control your diabetes. I will see you back in 1 year

## 2012-11-02 NOTE — Telephone Encounter (Signed)
, °

## 2012-12-21 ENCOUNTER — Telehealth: Payer: Self-pay | Admitting: Family Medicine

## 2012-12-21 MED ORDER — ROSUVASTATIN CALCIUM 20 MG PO TABS: 20.0000 mg | ORAL_TABLET | Freq: Every day | ORAL | Status: DC

## 2012-12-21 MED ORDER — METFORMIN HCL 1000 MG PO TABS: 1000.0000 mg | ORAL_TABLET | Freq: Two times a day (BID) | ORAL | Status: DC

## 2012-12-21 MED ORDER — LISINOPRIL 10 MG PO TABS: 10.0000 mg | ORAL_TABLET | Freq: Every day | ORAL | Status: DC

## 2012-12-21 NOTE — Telephone Encounter (Signed)
Rx Refilled  

## 2012-12-28 ENCOUNTER — Telehealth: Payer: Self-pay | Admitting: Family Medicine

## 2012-12-28 ENCOUNTER — Other Ambulatory Visit: Payer: Self-pay | Admitting: Family Medicine

## 2012-12-28 MED ORDER — INSULIN GLARGINE 100 UNIT/ML ~~LOC~~ SOLN: 30.0000 [IU] | Freq: Every morning | SUBCUTANEOUS | Status: DC

## 2012-12-28 NOTE — Telephone Encounter (Signed)
Pt called and script rf

## 2012-12-28 NOTE — Telephone Encounter (Signed)
rx already put in

## 2012-12-31 ENCOUNTER — Telehealth: Payer: Self-pay | Admitting: Family Medicine

## 2012-12-31 MED ORDER — INSULIN GLARGINE 100 UNIT/ML ~~LOC~~ SOLN: 30.0000 [IU] | Freq: Two times a day (BID) | SUBCUTANEOUS | Status: DC

## 2012-12-31 NOTE — Telephone Encounter (Signed)
Pt c/o rec'd refill of lantus insulin vials and uses solostar pens  Correction made to med list and refill resent to pharmacy.

## 2013-01-04 ENCOUNTER — Ambulatory Visit (INDEPENDENT_AMBULATORY_CARE_PROVIDER_SITE_OTHER): Payer: Medicare Other | Admitting: Family Medicine

## 2013-01-04 ENCOUNTER — Encounter: Payer: Self-pay | Admitting: Family Medicine

## 2013-01-04 VITALS — BP 100/68 | HR 76 | Temp 98.2°F | Resp 18 | Wt 243.0 lb

## 2013-01-04 DIAGNOSIS — E785 Hyperlipidemia, unspecified: Secondary | ICD-10-CM

## 2013-01-04 DIAGNOSIS — Z794 Long term (current) use of insulin: Secondary | ICD-10-CM

## 2013-01-04 DIAGNOSIS — I10 Essential (primary) hypertension: Secondary | ICD-10-CM

## 2013-01-04 DIAGNOSIS — E119 Type 2 diabetes mellitus without complications: Secondary | ICD-10-CM | POA: Diagnosis not present

## 2013-01-04 LAB — HEPATIC FUNCTION PANEL
AST: 17 U/L (ref 0–37)
Alkaline Phosphatase: 68 U/L (ref 39–117)
Bilirubin, Direct: 0.2 mg/dL (ref 0.0–0.3)
Indirect Bilirubin: 0.5 mg/dL (ref 0.0–0.9)
Total Bilirubin: 0.7 mg/dL (ref 0.3–1.2)

## 2013-01-04 LAB — LIPID PANEL
HDL: 27 mg/dL — ABNORMAL LOW (ref >39)
LDL Cholesterol: 38 mg/dL (ref 0–99)
Triglycerides: 226 mg/dL — ABNORMAL HIGH (ref <150)
VLDL: 45 mg/dL — ABNORMAL HIGH (ref 0–40)

## 2013-01-04 LAB — BASIC METABOLIC PANEL
Potassium: 4 mEq/L (ref 3.5–5.3)
Sodium: 135 mEq/L (ref 135–145)

## 2013-01-04 NOTE — Progress Notes (Signed)
  Subjective:    Patient ID: [name], male    DOB: 1966/07/19, 47 y.o.   MRN: [id number]  HPI Insulin-dependent diabetes mellitus type 2. The patient's currently taking Lantus 30 units twice a day. He states he was out of it for approximately one week due to a mixup at his pharmacy. He is not regularly checking sugars, however his fasting blood sugars can range 180-200. He denies polyuria, polydipsia, or blurred vision.  He states he is taking an aspirin.  Hypertension-patient is currently on lisinopril 10 mg by mouth daily. He denies chest pain, shortness of breath, dyspnea on exertion.  Hyperlipidemia-Crestor 20 mg by mouth daily. Denies myalgias, right upper quadrant pain. Past Medical History  Diagnosis Date  . Cancer carcinoid  . Diabetes mellitus   . Hypertension   . GERD (gastroesophageal reflux disease)   . Ventral hernia   . Hyperlipemia   . UTI (lower urinary tract infection)   . Colon cancer   . Anxiety     PT. DENIES   Current Outpatient Prescriptions on File Prior to Visit  Medication Sig Dispense Refill  . famotidine (PEPCID) 20 MG tablet Take 20 mg by mouth 2 (two) times daily as needed.       [name] ibuprofen (ADVIL,MOTRIN) 200 MG tablet Take 200 mg by mouth every 8 (eight) hours as needed.      . insulin glargine (LANTUS SOLOSTAR) 100 UNIT/ML injection Inject 0.3 mLs (30 Units total) into the skin 2 (two) times daily.  15 mL  4  . lisinopril (PRINIVIL,ZESTRIL) 10 MG tablet Take 1 tablet (10 mg total) by mouth daily.  30 tablet  5  . metFORMIN (GLUCOPHAGE) 1000 MG tablet Take 1 tablet (1,000 mg total) by mouth 2 (two) times daily with a meal.  60 tablet  3  . rosuvastatin (CRESTOR) 20 MG tablet Take 1 tablet (20 mg total) by mouth daily.  30 tablet  5   No current facility-administered medications on file prior to visit.   No Known Allergies    Review of Systems    review of systems is otherwise negative Objective:   Physical Exam  Constitutional: He appears  well-developed and well-nourished.  Cardiovascular: Normal rate, regular rhythm, normal heart sounds and intact distal pulses.  Exam reveals no gallop and no friction rub.   No murmur heard. Pulmonary/Chest: Effort normal and breath sounds normal. No respiratory distress. He has no wheezes. He has no rales. He exhibits no tenderness.  Abdominal: Soft. Bowel sounds are normal. He exhibits no distension and no mass. There is no tenderness. There is no rebound and no guarding.  Neurological: He displays normal reflexes. He exhibits normal muscle tone.  Skin: Skin is warm and dry. No rash noted. No erythema. No pallor.   patient has normal sensation to 10 mg monofilament test bilaterally 4/4 Patient has large ventral hernia       Assessment & Plan:  1. HTN (hypertension) Blood pressures well controlled, continue lisinopril 10 mg by mouth daily. - Basic Metabolic Panel  2. IDDM (insulin dependent diabetes mellitus) Check hemoglobin A1c. Goal A1c is less than 7. We'll likely need to increase his Lantus substantially.  Strongly encourage low carbohydrate diet and compliance with medications - Basic Metabolic Panel - Hepatic Function Panel - Hemoglobin A1c - Lipid Panel  3. HLD (hyperlipidemia) LDL is less than 100. Obtain fasting lipid panel to evaluate. - Basic Metabolic Panel - Hepatic Function Panel - Hemoglobin A1c - Lipid Panel

## 2013-01-11 DIAGNOSIS — E119 Type 2 diabetes mellitus without complications: Secondary | ICD-10-CM | POA: Diagnosis not present

## 2013-01-14 ENCOUNTER — Telehealth: Payer: Self-pay | Admitting: Family Medicine

## 2013-01-14 MED ORDER — LISINOPRIL 10 MG PO TABS: 10.0000 mg | ORAL_TABLET | Freq: Every day | ORAL | Status: DC

## 2013-01-14 MED ORDER — ROSUVASTATIN CALCIUM 20 MG PO TABS: 20.0000 mg | ORAL_TABLET | Freq: Every day | ORAL | Status: DC

## 2013-01-14 NOTE — Telephone Encounter (Signed)
Rx Refilled  

## 2013-02-17 ENCOUNTER — Telehealth: Payer: Self-pay | Admitting: Family Medicine

## 2013-02-17 MED ORDER — FAMOTIDINE 20 MG PO TABS: 20.0000 mg | ORAL_TABLET | Freq: Two times a day (BID) | ORAL | Status: DC | PRN

## 2013-02-17 NOTE — Telephone Encounter (Signed)
Rx Refilled  

## 2013-03-15 ENCOUNTER — Telehealth: Payer: Self-pay | Admitting: Family Medicine

## 2013-03-15 MED ORDER — METOCLOPRAMIDE HCL 10 MG PO TABS: 10.0000 mg | ORAL_TABLET | Freq: Four times a day (QID) | ORAL | Status: DC

## 2013-03-15 NOTE — Telephone Encounter (Signed)
Rx Refilled  

## 2013-04-12 ENCOUNTER — Other Ambulatory Visit: Payer: Self-pay | Admitting: Family Medicine

## 2013-06-08 ENCOUNTER — Other Ambulatory Visit: Payer: Self-pay | Admitting: Family Medicine

## 2013-06-08 ENCOUNTER — Encounter: Payer: Self-pay | Admitting: Family Medicine

## 2013-06-08 NOTE — Telephone Encounter (Signed)
Medication refill for one time only.  Patient needs to be seen.  Letter sent for patient to call and schedule 

## 2013-07-14 ENCOUNTER — Other Ambulatory Visit: Payer: Self-pay | Admitting: Family Medicine

## 2013-07-27 ENCOUNTER — Other Ambulatory Visit: Payer: Self-pay | Admitting: *Deleted

## 2013-07-27 MED ORDER — INSULIN GLARGINE 100 UNIT/ML ~~LOC~~ SOLN: 30.0000 [IU] | Freq: Two times a day (BID) | SUBCUTANEOUS | Status: DC

## 2013-07-27 NOTE — Telephone Encounter (Signed)
Med refilled.

## 2013-08-02 ENCOUNTER — Encounter: Payer: Self-pay | Admitting: Family Medicine

## 2013-08-02 ENCOUNTER — Ambulatory Visit (INDEPENDENT_AMBULATORY_CARE_PROVIDER_SITE_OTHER): Payer: Medicare Other | Admitting: Family Medicine

## 2013-08-02 VITALS — BP 110/72 | HR 82 | Temp 98.0°F | Resp 20 | Wt 242.0 lb

## 2013-08-02 DIAGNOSIS — Z23 Encounter for immunization: Secondary | ICD-10-CM | POA: Diagnosis not present

## 2013-08-02 LAB — COMPLETE METABOLIC PANEL WITH GFR
AST: 21 U/L (ref 0–37)
Alkaline Phosphatase: 65 U/L (ref 39–117)
BUN: 6 mg/dL (ref 6–23)
Creat: 0.73 mg/dL (ref 0.50–1.35)
GFR, Est Non African American: >89 mL/min
Glucose, Bld: 264 mg/dL — ABNORMAL HIGH (ref 70–99)
Potassium: 4.6 mEq/L (ref 3.5–5.3)
Total Bilirubin: 0.8 mg/dL (ref 0.3–1.2)

## 2013-08-02 LAB — HEMOGLOBIN A1C
Hgb A1c MFr Bld: 10.2 % — ABNORMAL HIGH (ref <5.7)
Mean Plasma Glucose: 246 mg/dL — ABNORMAL HIGH (ref <117)

## 2013-08-02 LAB — LIPID PANEL
Cholesterol: 104 mg/dL (ref 0–200)
HDL: 30 mg/dL — ABNORMAL LOW (ref >39)
LDL Cholesterol: 38 mg/dL (ref 0–99)
Triglycerides: 179 mg/dL — ABNORMAL HIGH (ref <150)

## 2013-08-02 NOTE — Progress Notes (Signed)
Subjective:    Patient ID: [name], male    DOB: 01-07-1966, 47 y.o.   MRN: [id number]  HPI  Patient is a noncompliant 47 year old white male who comes in today accompanied by his mother for his followup of type 2 diabetes mellitus.  He has never increased his insulin as directed. He continues to take Lantus 30 units subcutaneously twice a day. His fasting blood sugars range 190-250. He denies any episodes of hypoglycemia. He denies any polyuria, polydipsia, or blurred vision. He denies any chest pain, shortness of breath, dyspnea on exertion. He is not taking an aspirin. His blood pressure is currently well controlled. Past Medical History  Diagnosis Date  . Cancer carcinoid  . Diabetes mellitus   . Hypertension   . GERD (gastroesophageal reflux disease)   . Ventral hernia   . Hyperlipemia   . UTI (lower urinary tract infection)   . Colon cancer   . Anxiety     PT. DENIES   Past Surgical History  Procedure Laterality Date  . Cholecystectomy    . Small intestine surgery    . Hernia repair    . Colon surgery      2009   Current Outpatient Prescriptions on File Prior to Visit  Medication Sig Dispense Refill  . famotidine (PEPCID) 20 MG tablet Take 1 tablet (20 mg total) by mouth 2 (two) times daily as needed.  60 tablet  11  . insulin glargine (LANTUS) 100 UNIT/ML injection Inject 0.3 mLs (30 Units total) into the skin 2 (two) times daily.  15 mL  1  . lisinopril (PRINIVIL,ZESTRIL) 10 MG tablet Take 1 tablet (10 mg total) by mouth daily.  30 tablet  5  . metFORMIN (GLUCOPHAGE) 1000 MG tablet TAKE 1 TABLET BY MOUTH TWICE DAILY WITH A MEAL  60 tablet  5  . metoCLOPramide (REGLAN) 10 MG tablet Take 1 tablet (10 mg total) by mouth 4 (four) times daily.  120 tablet  5  . rosuvastatin (CRESTOR) 20 MG tablet Take 1 tablet (20 mg total) by mouth daily.  30 tablet  5   No current facility-administered medications on file prior to visit.   No Known Allergies History   Social  History  . Marital Status: Single    Spouse Name: N/A    Number of Children: N/A  . Years of Education: N/A   Occupational History  . Not on file.   Social History Main Topics  . Smoking status: Never Smoker   . Smokeless tobacco: Never Used  . Alcohol Use: No  . Drug Use: No  . Sexual Activity: Not Currently   Other Topics Concern  . Not on file   Social History Narrative  . No narrative on file     Review of Systems  All other systems reviewed and are negative.       Objective:   Physical Exam  Vitals reviewed. Neck: Neck supple. No JVD present. No thyromegaly present.  Cardiovascular: Normal rate, regular rhythm and normal heart sounds.  Exam reveals no gallop and no friction rub.   No murmur heard. Pulmonary/Chest: Effort normal and breath sounds normal. No respiratory distress. He has no wheezes. He has no rales. He exhibits no tenderness.  Abdominal: Soft. Bowel sounds are normal. He exhibits no distension. There is tenderness.  Musculoskeletal: He exhibits no edema.  Lymphadenopathy:    He has no cervical adenopathy.   patient has a large ventral hernia  Assessment & Plan:  1. Type II or unspecified type diabetes mellitus without mention of complication, uncontrolled I recommended a flu shot today which the patient hesitantly agreed to receive.  I also recommended checking a CMP, fasting lipid panel, and hemoglobin A1c. His goal hemoglobin A1c is less than 7. Therefore I recommended increasing his insulin 1 unit each day and to fasting blood sugars are less than 130. Recheck in one month. I also check a fasting lipid panel. His goal LDL is less than 100. - COMPLETE METABOLIC PANEL WITH GFR - Hemoglobin A1c - Lipid panel

## 2013-08-02 NOTE — Addendum Note (Signed)
Addended by: Legrand Rams B on: 08/02/2013 10:22 AM   Modules accepted: Orders

## 2013-08-04 ENCOUNTER — Encounter: Payer: Self-pay | Admitting: Family Medicine

## 2013-08-12 ENCOUNTER — Other Ambulatory Visit: Payer: Self-pay | Admitting: Family Medicine

## 2013-09-20 ENCOUNTER — Other Ambulatory Visit: Payer: Self-pay | Admitting: Family Medicine

## 2013-09-20 NOTE — Telephone Encounter (Signed)
Medication refilled per protocol. 

## 2013-09-27 ENCOUNTER — Ambulatory Visit (INDEPENDENT_AMBULATORY_CARE_PROVIDER_SITE_OTHER): Payer: Medicare Other | Admitting: Family Medicine

## 2013-09-27 ENCOUNTER — Encounter: Payer: Self-pay | Admitting: Family Medicine

## 2013-09-27 VITALS — BP 120/78 | HR 84 | Temp 97.1°F | Resp 20 | Wt 249.0 lb

## 2013-09-27 DIAGNOSIS — K439 Ventral hernia without obstruction or gangrene: Secondary | ICD-10-CM | POA: Diagnosis not present

## 2013-09-27 NOTE — Progress Notes (Signed)
Subjective:    Patient ID: [name], male    DOB: Aug 19, 1966, 48 y.o.   MRN: [id number]  HPI Patient is a 48 year old white male who I last saw in November. At that time his hemoglobin A1c was found to be 10.2 and out of control. He was taking 30 units of Lantus twice a day at that time. I recommended that he increase his Lantus 1 unit every day until his fasting blood sugars fell below 130. The patient never did this. He continues to take 30 units twice a day. He continues to have sugars in the 200s. He presents today complaining of a painful ventral hernia. He has a history of cholecystectomy as well as colon resection due to colon cancer. This created a midline abdominal incision through which the patient developed a ventral hernia.  Patient states the hernia is tender most days. He is interested in surgical correction. Past Medical History  Diagnosis Date  . Cancer carcinoid  . Diabetes mellitus   . Hypertension   . GERD (gastroesophageal reflux disease)   . Ventral hernia   . Hyperlipemia   . UTI (lower urinary tract infection)   . Colon cancer   . Anxiety     PT. DENIES   Current Outpatient Prescriptions on File Prior to Visit  Medication Sig Dispense Refill  . CRESTOR 20 MG tablet TAKE 1 TABLET BY MOUTH EVERY DAY  30 tablet  3  . famotidine (PEPCID) 20 MG tablet Take 1 tablet (20 mg total) by mouth 2 (two) times daily as needed.  60 tablet  11  . lisinopril (PRINIVIL,ZESTRIL) 10 MG tablet Take 1 tablet (10 mg total) by mouth daily.  30 tablet  5  . metFORMIN (GLUCOPHAGE) 1000 MG tablet TAKE 1 TABLET BY MOUTH TWICE DAILY WITH A MEAL  60 tablet  5  . metoCLOPramide (REGLAN) 10 MG tablet Take 1 tablet (10 mg total) by mouth 4 (four) times daily.  120 tablet  5  . rosuvastatin (CRESTOR) 20 MG tablet Take 1 tablet (20 mg total) by mouth daily.  30 tablet  5   No current facility-administered medications on file prior to visit.   No Known Allergies History   Social History    . Marital Status: Single    Spouse Name: N/A    Number of Children: N/A  . Years of Education: N/A   Occupational History  . Not on file.   Social History Main Topics  . Smoking status: Never Smoker   . Smokeless tobacco: Never Used  . Alcohol Use: No  . Drug Use: No  . Sexual Activity: Not Currently   Other Topics Concern  . Not on file   Social History Narrative  . No narrative on file       Review of Systems  All other systems reviewed and are negative.       Objective:   Physical Exam  Vitals reviewed. Constitutional: He appears well-developed and well-nourished. No distress.  Cardiovascular: Normal rate, regular rhythm, normal heart sounds and intact distal pulses.  Exam reveals no gallop and no friction rub.   No murmur heard. Pulmonary/Chest: Effort normal and breath sounds normal. No respiratory distress. He has no wheezes. He has no rales. He exhibits no tenderness.  Abdominal: Soft. Bowel sounds are normal. He exhibits no distension and no mass. There is no tenderness. There is no rebound and no guarding.  Skin: He is not diaphoretic.  Patient has a large ventral hernia.  Assessment & Plan:  1. Ventral hernia I will consult Gen. Surgery for his ventral hernia. I did tell the patient that I thought he would be high risk surgical candidate due to his uncontrolled diabetes. I recommended that he increase his Lantus to 40 units twice a day and then began increasing his insulin one unit twice a day for every day in which his fasting blood sugar was greater than 130.  I requested that the patient contact me in 2 weeks and tell me how his sugars are running both fasting and 2 hours postprandial so that I can titrate his insulin further. - Ambulatory referral to General Surgery

## 2013-10-11 ENCOUNTER — Ambulatory Visit (INDEPENDENT_AMBULATORY_CARE_PROVIDER_SITE_OTHER): Payer: Medicare Other | Admitting: Surgery

## 2013-10-19 ENCOUNTER — Other Ambulatory Visit: Payer: Self-pay | Admitting: Family Medicine

## 2013-10-22 ENCOUNTER — Other Ambulatory Visit: Payer: Self-pay | Admitting: Family Medicine

## 2013-10-22 NOTE — Telephone Encounter (Signed)
Refill appropriate and filled per protocol. 

## 2013-11-01 ENCOUNTER — Encounter: Payer: Self-pay | Admitting: Oncology

## 2013-11-01 ENCOUNTER — Other Ambulatory Visit (HOSPITAL_BASED_OUTPATIENT_CLINIC_OR_DEPARTMENT_OTHER): Payer: Medicare Other

## 2013-11-01 ENCOUNTER — Telehealth: Payer: Self-pay | Admitting: Oncology

## 2013-11-01 ENCOUNTER — Ambulatory Visit (HOSPITAL_BASED_OUTPATIENT_CLINIC_OR_DEPARTMENT_OTHER): Payer: Medicare Other | Admitting: Oncology

## 2013-11-01 VITALS — BP 135/94 | HR 80 | Temp 97.8°F | Resp 18 | Ht 69.0 in | Wt 245.2 lb

## 2013-11-01 DIAGNOSIS — R109 Unspecified abdominal pain: Secondary | ICD-10-CM | POA: Diagnosis not present

## 2013-11-01 DIAGNOSIS — C7B8 Other secondary neuroendocrine tumors: Secondary | ICD-10-CM

## 2013-11-01 DIAGNOSIS — K469 Unspecified abdominal hernia without obstruction or gangrene: Secondary | ICD-10-CM | POA: Diagnosis not present

## 2013-11-01 DIAGNOSIS — Z859 Personal history of malignant neoplasm, unspecified: Secondary | ICD-10-CM

## 2013-11-01 LAB — CBC WITH DIFFERENTIAL/PLATELET
BASO%: 0.7 % (ref 0.0–2.0)
Basophils Absolute: 0 10*3/uL (ref 0.0–0.1)
EOS%: 2.9 % (ref 0.0–7.0)
Eosinophils Absolute: 0.1 10*3/uL (ref 0.0–0.5)
HCT: 45 % (ref 38.4–49.9)
HGB: 15.6 g/dL (ref 13.0–17.1)
LYMPH%: 53.4 % — AB (ref 14.0–49.0)
MCH: 33.5 pg — ABNORMAL HIGH (ref 27.2–33.4)
MCHC: 34.7 g/dL (ref 32.0–36.0)
MCV: 96.7 fL (ref 79.3–98.0)
MONO#: 0.3 10*3/uL (ref 0.1–0.9)
MONO%: 6.1 % (ref 0.0–14.0)
NEUT#: 1.6 10*3/uL (ref 1.5–6.5)
NEUT%: 36.9 % — AB (ref 39.0–75.0)
Platelets: 143 10*3/uL (ref 140–400)
RBC: 4.65 10*6/uL (ref 4.20–5.82)
RDW: 12.6 % (ref 11.0–14.6)
WBC: 4.2 10*3/uL (ref 4.0–10.3)
lymph#: 2.2 10*3/uL (ref 0.9–3.3)

## 2013-11-01 LAB — COMPREHENSIVE METABOLIC PANEL (CC13)
ALK PHOS: 61 U/L (ref 40–150)
ALT: 25 U/L (ref 0–55)
ANION GAP: 11 meq/L (ref 3–11)
AST: 26 U/L (ref 5–34)
Albumin: 3.8 g/dL (ref 3.5–5.0)
BILIRUBIN TOTAL: 0.71 mg/dL (ref 0.20–1.20)
BUN: 6 mg/dL — ABNORMAL LOW (ref 7.0–26.0)
CO2: 23 meq/L (ref 22–29)
Calcium: 9.3 mg/dL (ref 8.4–10.4)
Chloride: 104 mEq/L (ref 98–109)
Creatinine: 0.7 mg/dL (ref 0.7–1.3)
Glucose: 287 mg/dl — ABNORMAL HIGH (ref 70–140)
Potassium: 3.6 mEq/L (ref 3.5–5.1)
SODIUM: 138 meq/L (ref 136–145)
TOTAL PROTEIN: 7 g/dL (ref 6.4–8.3)

## 2013-11-01 NOTE — Patient Instructions (Signed)
Hernia A hernia occurs when an internal organ pushes out through a weak spot in the abdominal wall. Hernias most commonly occur in the groin and around the navel. Hernias often can be pushed back into place (reduced). Most hernias tend to get worse over time. Some abdominal hernias can get stuck in the opening (irreducible or incarcerated hernia) and cannot be reduced. An irreducible abdominal hernia which is tightly squeezed into the opening is at risk for impaired blood supply (strangulated hernia). A strangulated hernia is a medical emergency. Because of the risk for an irreducible or strangulated hernia, surgery may be recommended to repair a hernia. CAUSES   Heavy lifting.  Prolonged coughing.  Straining to have a bowel movement.  A cut (incision) made during an abdominal surgery. HOME CARE INSTRUCTIONS   Bed rest is not required. You may continue your normal activities.  Avoid lifting more than 10 pounds (4.5 kg) or straining.  Cough gently. If you are a smoker it is best to stop. Even the best hernia repair can break down with the continual strain of coughing. Even if you do not have your hernia repaired, a cough will continue to aggravate the problem.  Do not wear anything tight over your hernia. Do not try to keep it in with an outside bandage or truss. These can damage abdominal contents if they are trapped within the hernia sac.  Eat a normal diet.  Avoid constipation. Straining over long periods of time will increase hernia size and encourage breakdown of repairs. If you cannot do this with diet alone, stool softeners may be used. SEEK IMMEDIATE MEDICAL CARE IF:   You have a fever.  You develop increasing abdominal pain.  You feel nauseous or vomit.  Your hernia is stuck outside the abdomen, looks discolored, feels hard, or is tender.  You have any changes in your bowel habits or in the hernia that are unusual for you.  You have increased pain or swelling around the  hernia.  You cannot push the hernia back in place by applying gentle pressure while lying down. MAKE SURE YOU:   Understand these instructions.  Will watch your condition.  Will get help right away if you are not doing well or get worse. Document Released: 08/26/2005 Document Revised: 11/18/2011 Document Reviewed: 04/14/2008 [employer] Patient Information 2014 [employer].  

## 2013-11-01 NOTE — Telephone Encounter (Signed)
, °

## 2013-11-01 NOTE — Progress Notes (Signed)
OFFICE PROGRESS NOTE  CC  Odette Fraction, MD [address] [place] [address]  DIAGNOSIS: 48 year old with neuroendocrine carcinoid tumor of the ileum diagnosed 07/2005  PRIOR THERAPY: 1. S/P ileum resection in 07/2005 for neuroendocrine carcinoid 2. Observation for the carcinoid  CURRENT THERAPY:observation  INTERVAL HISTORY: [name] 48 y.o. male returns for follow up visit. Overall he is doing well.  He has fatigue. He is experiencing abdominal pain but thinks this may be secondary to the hernia. He does not have any nausea or vomiting he has no constipation or diarrhea. No flushing.No fevers or chills no nausea or vomiting. No headaches, no easy bruising. Denies any bleeding. Remainder of the 10 point review of systems is negative.  MEDICAL HISTORY: Past Medical History  Diagnosis Date  . Cancer carcinoid  . Diabetes mellitus   . Hypertension   . GERD (gastroesophageal reflux disease)   . Ventral hernia   . Hyperlipemia   . UTI (lower urinary tract infection)   . Colon cancer   . Anxiety     PT. DENIES    ALLERGIES:  has No Known Allergies.  MEDICATIONS:  Current Outpatient Prescriptions  Medication Sig Dispense Refill  . ACCU-CHEK AVIVA PLUS test strip USE AS DIRECTED THREE TIMES DAILY  100 each  3  . CRESTOR 20 MG tablet TAKE 1 TABLET BY MOUTH EVERY DAY  30 tablet  3  . famotidine (PEPCID) 20 MG tablet Take 1 tablet (20 mg total) by mouth 2 (two) times daily as needed.  60 tablet  11  . LANTUS SOLOSTAR 100 UNIT/ML Solostar Pen INJECT 30 UNITS SUBCUTANEOUS TWICE DAILY  15 mL  5  . metFORMIN (GLUCOPHAGE) 1000 MG tablet TAKE 1 TABLET BY MOUTH TWICE DAILY WITH A MEAL  60 tablet  5  . B-D ULTRAFINE III SHORT PEN 31G X 8 MM MISC       . Insulin Glargine (LANTUS SOLOSTAR) 100 UNIT/ML Solostar Pen INJECT 30 units in AM and 32 units in PM      . lisinopril (PRINIVIL,ZESTRIL) 10 MG tablet Take 1 tablet (10 mg total) by mouth daily.  30 tablet  5  .  metoCLOPramide (REGLAN) 10 MG tablet Take 1 tablet (10 mg total) by mouth 4 (four) times daily.  120 tablet  5   No current facility-administered medications for this visit.    SURGICAL HISTORY:  Past Surgical History  Procedure Laterality Date  . Cholecystectomy    . Small intestine surgery    . Hernia repair    . Colon surgery      2009    REVIEW OF SYSTEMS:  Pertinent items are noted in HPI.   PHYSICAL EXAMINATION: General appearance: alert, cooperative, appears stated age, distracted, fatigued, pale and slowed mentation Neck: no adenopathy, no carotid bruit, no JVD, supple, symmetrical, trachea midline and thyroid not enlarged, symmetric, no tenderness/mass/nodules Lymph nodes: Cervical, supraclavicular, and axillary nodes normal. Resp: clear to auscultation bilaterally and normal percussion bilaterally Back: symmetric, no curvature. ROM normal. No CVA tenderness. Cardio: regular rate and rhythm, S1, S2 normal, no murmur, click, rub or gallop GI: abdomen protuberant with incisional hernia that is reducible. no HSM Extremities: extremities normal, atraumatic, no cyanosis or edema Neurologic: Alert and oriented X 3, normal strength and tone. Normal symmetric reflexes. Normal coordination and gait  ECOG PERFORMANCE STATUS: 1 - Symptomatic but completely ambulatory  Blood pressure 135/94, pulse 80, temperature 97.8 F (36.6 C), temperature source Oral, resp.  rate 18, height 5\' 9"  (1.753 m), weight 245 lb 4 oz (111.245 kg).  LABORATORY DATA: Lab Results  Component Value Date   WBC 4.2 11/01/2013   HGB 15.6 11/01/2013   HCT 45.0 11/01/2013   MCV 96.7 11/01/2013   PLT 143 11/01/2013      Chemistry      Component Value Date/Time   NA 137 08/02/2013 1009   NA 136 11/02/2012 0955   NA 131 10/26/2009 1008   K 4.6 08/02/2013 1009   K 3.5 11/02/2012 0955   K 3.9 10/26/2009 1008   CL 97 08/02/2013 1009   CL 100 11/02/2012 0955   CL 100 10/26/2009 1008   CO2 27 08/02/2013 1009   CO2  25 11/02/2012 0955   CO2 28 10/26/2009 1008   BUN 6 08/02/2013 1009   BUN 3.5* 11/02/2012 0955   BUN 10 10/26/2009 1008   CREATININE 0.73 08/02/2013 1009   CREATININE 0.9 11/02/2012 0955   CREATININE 0.66 10/28/2011 0904      Component Value Date/Time   CALCIUM 9.9 08/02/2013 1009   CALCIUM 9.2 11/02/2012 0955   CALCIUM 9.0 10/26/2009 1008   ALKPHOS 65 08/02/2013 1009   ALKPHOS 67 11/02/2012 0955   ALKPHOS 86* 10/26/2009 1008   AST 21 08/02/2013 1009   AST 32 11/02/2012 0955   AST 32 10/26/2009 1008   ALT 29 08/02/2013 1009   ALT 36 11/02/2012 0955   ALT 43 10/26/2009 1008   BILITOT 0.8 08/02/2013 1009   BILITOT 0.62 11/02/2012 0955   BILITOT 0.80 10/26/2009 1008       RADIOGRAPHIC STUDIES:  No results found.  ASSESSMENT: 49 year old male with   1. Neuroendocrine carcinoid of the ileum s/p resection of the ileum in 07/2005. No clinical evidence of recurrence.   2. Abdominal pain: question whether this is due to the abdominal hernia or whether he has recurrence of neuro endocrine carcinoid of the ileum. Recommended that we obtain CT scan of the abdomen.  3. Followup: Patient will be seen back in about a month's time with results of the CT.    All questions were answered. The patient knows to call the clinic with any problems, questions or concerns. We can certainly see the patient much sooner if necessary.  I spent 15 minutes counseling the patient face to face. The total time spent in the appointment was 30 minutes.    Marcy Panning, MD Medical/Oncology [facility] [phone] (beeper) [phone] (Office)  11/01/2013, 11:25 AM

## 2013-11-03 ENCOUNTER — Ambulatory Visit (HOSPITAL_COMMUNITY)
Admission: RE | Admit: 2013-11-03 | Discharge: 2013-11-03 | Disposition: A | Discharge status: Home / Self Care | Payer: Medicare Other | Source: Ambulatory Visit | Attending: Oncology | Admitting: Oncology

## 2013-11-08 ENCOUNTER — Encounter (HOSPITAL_COMMUNITY): Payer: Self-pay

## 2013-11-08 ENCOUNTER — Ambulatory Visit (HOSPITAL_COMMUNITY)
Admission: RE | Admit: 2013-11-08 | Discharge: 2013-11-08 | Disposition: A | Discharge status: Home / Self Care | Payer: Medicare Other | Source: Ambulatory Visit | Attending: Oncology | Admitting: Oncology

## 2013-11-08 DIAGNOSIS — Z85038 Personal history of other malignant neoplasm of large intestine: Secondary | ICD-10-CM | POA: Diagnosis not present

## 2013-11-08 DIAGNOSIS — Z9049 Acquired absence of other specified parts of digestive tract: Secondary | ICD-10-CM | POA: Diagnosis not present

## 2013-11-08 DIAGNOSIS — K469 Unspecified abdominal hernia without obstruction or gangrene: Secondary | ICD-10-CM

## 2013-11-08 DIAGNOSIS — Z859 Personal history of malignant neoplasm, unspecified: Secondary | ICD-10-CM | POA: Insufficient documentation

## 2013-11-08 DIAGNOSIS — E119 Type 2 diabetes mellitus without complications: Secondary | ICD-10-CM | POA: Insufficient documentation

## 2013-11-08 DIAGNOSIS — K7689 Other specified diseases of liver: Secondary | ICD-10-CM | POA: Insufficient documentation

## 2013-11-08 DIAGNOSIS — R160 Hepatomegaly, not elsewhere classified: Secondary | ICD-10-CM | POA: Diagnosis not present

## 2013-11-08 DIAGNOSIS — R109 Unspecified abdominal pain: Secondary | ICD-10-CM | POA: Diagnosis not present

## 2013-11-08 MED ORDER — IOHEXOL 300 MG/ML  SOLN
100.0000 mL | Freq: Once | INTRAMUSCULAR | Status: AC | PRN
  Administered 2013-11-08: 100 mL via INTRAVENOUS

## 2013-11-10 ENCOUNTER — Other Ambulatory Visit: Payer: Self-pay | Admitting: Family Medicine

## 2013-11-12 ENCOUNTER — Ambulatory Visit (INDEPENDENT_AMBULATORY_CARE_PROVIDER_SITE_OTHER): Payer: Medicare Other | Admitting: Surgery

## 2013-11-19 ENCOUNTER — Encounter (INDEPENDENT_AMBULATORY_CARE_PROVIDER_SITE_OTHER): Payer: Self-pay | Admitting: Surgery

## 2013-11-29 ENCOUNTER — Telehealth: Payer: Self-pay | Admitting: Adult Health

## 2013-11-29 NOTE — Telephone Encounter (Signed)
, °

## 2013-11-29 NOTE — Progress Notes (Signed)
Oer Dr. Humphrey Rolls - pt does not need lab orders.  POF sent

## 2013-11-30 ENCOUNTER — Ambulatory Visit: Payer: Medicare Other | Admitting: Oncology

## 2013-11-30 ENCOUNTER — Other Ambulatory Visit: Payer: Medicare Other

## 2013-11-30 ENCOUNTER — Ambulatory Visit (HOSPITAL_BASED_OUTPATIENT_CLINIC_OR_DEPARTMENT_OTHER): Payer: Medicare Other | Admitting: Adult Health

## 2013-11-30 ENCOUNTER — Encounter: Payer: Self-pay | Admitting: Adult Health

## 2013-11-30 VITALS — BP 112/76 | HR 71 | Temp 96.0°F | Resp 18 | Ht 69.0 in | Wt 244.5 lb

## 2013-11-30 DIAGNOSIS — Z859 Personal history of malignant neoplasm, unspecified: Secondary | ICD-10-CM

## 2013-11-30 NOTE — Progress Notes (Signed)
Hematology and Oncology Follow Up Visit  CADYN FANN [id number] 1965-09-26 48 y.o. 12/01/2013 12:55 PM  Principle Diagnosis: 48 year old male with h/o neuroendocrine carcinoid tumor of the ileum diagnosed in 07/2005.   Prior Therapy: 1. S/P ileum resection in 07/2005 for neuroendocrine carcinoid   2. Observation for the carcinoid.  Last CT of the A/P was on 11/08/2013 and was negative for metastatic disease to the abdomen and pelvis.     Current therapy: Observation  Interim History:  Patient is a 48 year old male with h/o neuroendocrine tumor who is here today for his CT scan of the abdomen and pelvis results.  He was told to come in for this appointment.  He continues to have pain near his ventral hernia.  He was supposed to have surgery, however his medicaid card has been messed up and he hasn't been able to have consultation with the surgeon.  Otherwise, he denies flushing, diarrhea, nausea, vomiting, or any other concerns.    Medications:  Current Outpatient Prescriptions  Medication Sig Dispense Refill  . ACCU-CHEK AVIVA PLUS test strip USE AS DIRECTED THREE TIMES DAILY  100 each  3  . B-D ULTRAFINE III SHORT PEN 31G X 8 MM MISC       . CRESTOR 20 MG tablet TAKE 1 TABLET BY MOUTH EVERY DAY  30 tablet  3  . famotidine (PEPCID) 20 MG tablet Take 1 tablet (20 mg total) by mouth 2 (two) times daily as needed.  60 tablet  11  . Insulin Glargine (LANTUS SOLOSTAR) 100 UNIT/ML Solostar Pen INJECT 30 units in AM and 32 units in PM      . LANTUS SOLOSTAR 100 UNIT/ML Solostar Pen INJECT 30 UNITS SUBCUTANEOUS TWICE DAILY  15 mL  5  . metFORMIN (GLUCOPHAGE) 1000 MG tablet TAKE 1 TABLET BY MOUTH TWICE DAILY WITH A MEAL  60 tablet  5  . metoCLOPramide (REGLAN) 10 MG tablet Take 10 mg by mouth every morning.      [name] lisinopril (PRINIVIL,ZESTRIL) 10 MG tablet Take 1 tablet (10 mg total) by mouth daily.  30 tablet  5   No current facility-administered medications for this visit.     Allergies:  No Known Allergies  Past Medical History, Surgical history, Social history, and Family History were reviewed and updated.  Review of Systems: A 10 point review of systems was conducted and is otherwise negative except for what is noted above.     Physical Exam: Blood pressure 112/76, pulse 71, temperature 96 F (35.6 C), temperature source Oral, resp. rate 18, height 5\' 9"  (1.753 m), weight 244 lb 8 oz (110.904 kg). GENERAL: Patient is a well appearing male in no acute distress, poor hygeine HEENT:  Sclerae anicteric.  Oropharynx clear and moist. No ulcerations or evidence of oropharyngeal candidiasis. Neck is supple.  NODES:  No cervical, supraclavicular, or axillary lymphadenopathy palpated.  LUNGS:  Clear to auscultation bilaterally.  No wheezes or rhonchi. HEART:  Regular rate and rhythm. No murmur appreciated. ABDOMEN:  Soft, nontender.  Positive, normoactive bowel sounds. No organomegaly palpated. + ventral hernia MSK:  No focal spinal tenderness to palpation. Full range of motion bilaterally in the upper extremities. EXTREMITIES:  No peripheral edema.   SKIN:  Clear with no obvious rashes or skin changes. No nail dyscrasia. NEURO:  Nonfocal. Well oriented.  Appropriate affect. ECOG: 1      Lab Results: Lab Results  Component Value Date   WBC 4.2 11/01/2013   HGB  15.6 11/01/2013   HCT 45.0 11/01/2013   MCV 96.7 11/01/2013   PLT 143 11/01/2013     Chemistry      Component Value Date/Time   NA 138 11/01/2013 1057   NA 137 08/02/2013 1009   NA 131 10/26/2009 1008   K 3.6 11/01/2013 1057   K 4.6 08/02/2013 1009   K 3.9 10/26/2009 1008   CL 97 08/02/2013 1009   CL 100 11/02/2012 0955   CL 100 10/26/2009 1008   CO2 23 11/01/2013 1057   CO2 27 08/02/2013 1009   CO2 28 10/26/2009 1008   BUN 6.0* 11/01/2013 1057   BUN 6 08/02/2013 1009   BUN 10 10/26/2009 1008   CREATININE 0.7 11/01/2013 1057   CREATININE 0.73 08/02/2013 1009   CREATININE 0.66 10/28/2011 0904      Component  Value Date/Time   CALCIUM 9.3 11/01/2013 1057   CALCIUM 9.9 08/02/2013 1009   CALCIUM 9.0 10/26/2009 1008   ALKPHOS 61 11/01/2013 1057   ALKPHOS 65 08/02/2013 1009   ALKPHOS 86* 10/26/2009 1008   AST 26 11/01/2013 1057   AST 21 08/02/2013 1009   AST 32 10/26/2009 1008   ALT 25 11/01/2013 1057   ALT 29 08/02/2013 1009   ALT 43 10/26/2009 1008   BILITOT 0.71 11/01/2013 1057   BILITOT 0.8 08/02/2013 1009   BILITOT 0.80 10/26/2009 1008       Radiological Studies: CLINICAL DATA: History of carcinoid tumor of the colon. Status post  partial colectomy. Cholecystectomy. Worsening abdominal pain.  Diabetes.  EXAM:  CT ABDOMEN AND PELVIS WITH CONTRAST  TECHNIQUE:  Multidetector CT imaging of the abdomen and pelvis was performed  using the standard protocol following bolus administration of  intravenous contrast.  CONTRAST: 121mL OMNIPAQUE IOHEXOL 300 MG/ML SOLN  COMPARISON: NM GASTRIC EMPTYING dated 04/28/2012; CT ABD/PELVIS W CM  dated 02/18/2012  FINDINGS:  Lower Chest: Clear lung bases. Normal heart size without pericardial  or pleural effusion.  Abdomen/Pelvis: Arterial phase images demonstrate no hypervascular  lesions within the liver, pancreas, or kidneys.  Portal venous phase images demonstrate moderate hepatic steatosis.  Hepatomegaly, greater than 20 cm craniocaudal. Mild prominence of  the caudate lobe, without specific evidence of cirrhosis.  Normal spleen, stomach, pancreas. Cholecystectomy without biliary  ductal dilatation. Normal adrenal glands and right kidney. A lower  pole left renal cyst measures 1.7 cm. Mild but age advanced aortic  atherosclerosis. Porta hepatis adenopathy. A portal caval node  measures 1.9 x 3.5 cm on image 58. This is similar to on the prior  exam.  Surgical changes of right hemicolectomy. Normal appearance of small  bowel loops, without ascites. No evidence of omental or peritoneal  disease. No mesenteric adenopathy.  No pelvic adenopathy. Normal  urinary bladder and prostate. No  significant free fluid.  Prior ventral abdominal wall hernia repair.  Bones/Musculoskeletal: Congenitally short lumbar pedicles. This  contributes to borderline central canal stenosis throughout the  lumbar spine.  IMPRESSION:  1. No acute process or evidence of metastatic disease in the abdomen  or pelvis.  2. Hepatic steatosis and hepatomegaly. Chronic mild porta hepatis  adenopathy, favored to be reactive and related to steatosis.  Electronically Signed  By: Abigail Miyamoto [name].  On: 11/08/2013 08:19    Assessment and Plan:  Patient is a 48 year old male with  1.  Neuroendocrine carcinoid tumor of the ileium that was diagnosed in 2006.  He is s/p resection.  Recent CT scan demonstrates no metastasis or  recurrence of cancer.  It does demonstrate hepatomegaly that his mom states he has always had since he was a baby.  I reviewed these with the patient in detail.  He has no other signs of recurrence.  He will continue to work on getting f/u made with a Psychologist, sport and exercise for his ventral hernia repair.    The patient will return in 6 months for labs and evaluation.    Minette Headland, [place] [phone] 3/25/201512:55 PM

## 2013-12-01 ENCOUNTER — Telehealth: Payer: Self-pay | Admitting: Oncology

## 2013-12-01 NOTE — Telephone Encounter (Signed)
, °

## 2013-12-14 ENCOUNTER — Other Ambulatory Visit: Payer: Self-pay | Admitting: Family Medicine

## 2013-12-14 NOTE — Telephone Encounter (Signed)
Medication refilled per protocol. 

## 2014-01-03 ENCOUNTER — Encounter (INDEPENDENT_AMBULATORY_CARE_PROVIDER_SITE_OTHER): Payer: Self-pay | Admitting: Surgery

## 2014-01-03 ENCOUNTER — Ambulatory Visit (INDEPENDENT_AMBULATORY_CARE_PROVIDER_SITE_OTHER): Payer: Medicare Other | Admitting: Surgery

## 2014-01-03 VITALS — BP 120/80 | HR 79 | Temp 97.1°F | Resp 16 | Ht 71.0 in | Wt 243.0 lb

## 2014-01-03 DIAGNOSIS — K432 Incisional hernia without obstruction or gangrene: Secondary | ICD-10-CM

## 2014-01-03 NOTE — Progress Notes (Signed)
Patient ID: [name], male   DOB: [birth year], 48 y.o.   MRN: [id number]  Chief Complaint  Patient presents with  . Umbilical Hernia    HPI [name] is a 48 y.o. male.  Referred by Dr. Margaretmary Eddy for evaluation of recurrent ventral hernia  HPI This is a 48 year old male who underwent right hemicolectomy for neuroendocrine carcinoid tumor in 2009. His course was complicated by ileus and wound infection. He developed an incisional hernia. In June of 2010 he underwent laparoscopic incisional hernia repair with mesh for a 10 x 12 cm midline defect. This was repaired with a 20 x 26 cm piece of proceed mesh. This was performed by Dr. Ronnald Collum.  Over the last couple of years, he has developed progressively enlarging swelling around his midline incision. This has become fairly uncomfortable. He presents now for surgical evaluation.   Past Medical History  Diagnosis Date  . Cancer carcinoid  . Diabetes mellitus   . Hypertension   . GERD (gastroesophageal reflux disease)   . Ventral hernia   . Hyperlipemia   . UTI (lower urinary tract infection)   . Colon cancer   . Anxiety     PT. DENIES    Past Surgical History  Procedure Laterality Date  . Cholecystectomy    . Small intestine surgery    . Hernia repair    . Colon surgery      2009    Family History  Problem Relation Age of Onset  . Cancer Mother 21    breast cancer  . Breast cancer Mother   . Diabetes Mother   . Diabetes Sister   . Emphysema Father     Social History History  Substance Use Topics  . Smoking status: Never Smoker   . Smokeless tobacco: Never Used  . Alcohol Use: No    No Known Allergies  Current Outpatient Prescriptions  Medication Sig Dispense Refill  . ACCU-CHEK AVIVA PLUS test strip USE AS DIRECTED THREE TIMES DAILY  100 each  3  . B-D ULTRAFINE III SHORT PEN 31G X 8 MM MISC       . CRESTOR 20 MG tablet TAKE 1 TABLET BY MOUTH EVERY DAY  30 tablet  4  . famotidine (PEPCID) 20 MG tablet  Take 1 tablet (20 mg total) by mouth 2 (two) times daily as needed.  60 tablet  11  . Insulin Glargine (LANTUS SOLOSTAR) 100 UNIT/ML Solostar Pen INJECT 30 units in AM and 32 units in PM      . LANTUS SOLOSTAR 100 UNIT/ML Solostar Pen INJECT 30 UNITS SUBCUTANEOUS TWICE DAILY  15 mL  5  . metFORMIN (GLUCOPHAGE) 1000 MG tablet TAKE 1 TABLET BY MOUTH TWICE DAILY WITH A MEAL  60 tablet  5  . metoCLOPramide (REGLAN) 10 MG tablet Take 10 mg by mouth every morning.       No current facility-administered medications for this visit.    Review of Systems Review of Systems  Constitutional: Negative for fever, chills and unexpected weight change.  HENT: Negative for congestion, hearing loss, sore throat, trouble swallowing and voice change.   Eyes: Negative for visual disturbance.  Respiratory: Positive for cough. Negative for wheezing.   Cardiovascular: Negative for chest pain, palpitations and leg swelling.  Gastrointestinal: Positive for abdominal pain and abdominal distention. Negative for nausea, vomiting, diarrhea, constipation, blood in stool, anal bleeding and rectal pain.  Genitourinary: Negative for hematuria and difficulty urinating.  Musculoskeletal: Negative for arthralgias.  Skin: Negative for rash and wound.  Neurological: Negative for seizures, syncope, weakness and headaches.  Hematological: Negative for adenopathy. Does not bruise/bleed easily.  Psychiatric/Behavioral: Negative for confusion.    Blood pressure 120/80, pulse 79, temperature 97.1 F (36.2 C), resp. rate 16, height 5\' 11"  (1.803 m), weight 243 lb (110.224 kg).  Physical Exam Physical Exam WDWN in NAD HEENT:  EOMI, sclera anicteric Neck:  No masses, no thyromegaly Lungs:  CTA bilaterally; normal respiratory effort CV:  Regular rate and rhythm; no murmurs Abd:  +bowel sounds, obese; healed right subcostal incision; healed midline incision; protuberant midline ventral hernia - reducible Ext:  Well-perfused; no  edema Skin:  Warm, dry; no sign of jaundice  Data Reviewed CLINICAL DATA: History of carcinoid tumor of the colon. Status post  partial colectomy. Cholecystectomy. Worsening abdominal pain.  Diabetes.  EXAM:  CT ABDOMEN AND PELVIS WITH CONTRAST  TECHNIQUE:  Multidetector CT imaging of the abdomen and pelvis was performed  using the standard protocol following bolus administration of  intravenous contrast.  CONTRAST: 117mL OMNIPAQUE IOHEXOL 300 MG/ML SOLN  COMPARISON: NM GASTRIC EMPTYING dated 04/28/2012; CT ABD/PELVIS W CM  dated 02/18/2012  FINDINGS:  Lower Chest: Clear lung bases. Normal heart size without pericardial  or pleural effusion.  Abdomen/Pelvis: Arterial phase images demonstrate no hypervascular  lesions within the liver, pancreas, or kidneys.  Portal venous phase images demonstrate moderate hepatic steatosis.  Hepatomegaly, greater than 20 cm craniocaudal. Mild prominence of  the caudate lobe, without specific evidence of cirrhosis.  Normal spleen, stomach, pancreas. Cholecystectomy without biliary  ductal dilatation. Normal adrenal glands and right kidney. A lower  pole left renal cyst measures 1.7 cm. Mild but age advanced aortic  atherosclerosis. Porta hepatis adenopathy. A portal caval node  measures 1.9 x 3.5 cm on image 58. This is similar to on the prior  exam.  Surgical changes of right hemicolectomy. Normal appearance of small  bowel loops, without ascites. No evidence of omental or peritoneal  disease. No mesenteric adenopathy.  No pelvic adenopathy. Normal urinary bladder and prostate. No  significant free fluid.  Prior ventral abdominal wall hernia repair.  Bones/Musculoskeletal: Congenitally short lumbar pedicles. This  contributes to borderline central canal stenosis throughout the  lumbar spine.  IMPRESSION:  1. No acute process or evidence of metastatic disease in the abdomen  or pelvis.  2. Hepatic steatosis and hepatomegaly. Chronic mild porta  hepatis  adenopathy, favored to be reactive and related to steatosis.  Electronically Signed  By: Abigail Miyamoto [name].  On: 11/08/2013 08:19   Assessment    Recurrent ventral hernia - it appears that the entire mesh has eventrated into the ventral hernia sac.     Plan    Open ventral hernia repair with mesh.  The surgical procedure has been discussed with the patient.  Potential risks, benefits, alternative treatments, and expected outcomes have been explained.  All of the patient's questions at this time have been answered.  The likelihood of reaching the patient's treatment goal is good.  The patient understand the proposed surgical procedure and wishes to proceed.         [name]. Prim Morace 01/03/2014, 12:58 PM

## 2014-01-13 ENCOUNTER — Encounter (HOSPITAL_COMMUNITY)
Admission: RE | Admit: 2014-01-13 | Discharge: 2014-01-13 | Disposition: A | Discharge status: Home / Self Care | Payer: Medicare Other | Source: Ambulatory Visit | Attending: Surgery | Admitting: Surgery

## 2014-01-13 ENCOUNTER — Encounter (HOSPITAL_COMMUNITY)
Admission: RE | Admit: 2014-01-13 | Discharge: 2014-01-13 | Disposition: A | Discharge status: Home / Self Care | Payer: Medicare Other | Source: Ambulatory Visit | Attending: Anesthesiology | Admitting: Anesthesiology

## 2014-01-13 ENCOUNTER — Encounter (HOSPITAL_COMMUNITY): Payer: Self-pay

## 2014-01-13 DIAGNOSIS — Z01810 Encounter for preprocedural cardiovascular examination: Secondary | ICD-10-CM | POA: Diagnosis not present

## 2014-01-13 DIAGNOSIS — Z01818 Encounter for other preprocedural examination: Secondary | ICD-10-CM | POA: Diagnosis not present

## 2014-01-13 DIAGNOSIS — Z01812 Encounter for preprocedural laboratory examination: Secondary | ICD-10-CM | POA: Diagnosis not present

## 2014-01-13 HISTORY — DX: Shortness of breath: R06.02

## 2014-01-13 LAB — CBC
HEMATOCRIT: 43.9 % (ref 39.0–52.0)
Hemoglobin: 16.1 g/dL (ref 13.0–17.0)
MCH: 34.5 pg — ABNORMAL HIGH (ref 26.0–34.0)
MCHC: 36.7 g/dL — AB (ref 30.0–36.0)
MCV: 94.2 fL (ref 78.0–100.0)
Platelets: 199 10*3/uL (ref 150–400)
RBC: 4.66 MIL/uL (ref 4.22–5.81)
RDW: 13.2 % (ref 11.5–15.5)
WBC: 5 10*3/uL (ref 4.0–10.5)

## 2014-01-13 LAB — BASIC METABOLIC PANEL
BUN: 6 mg/dL (ref 6–23)
CO2: 24 meq/L (ref 19–32)
CREATININE: 0.54 mg/dL (ref 0.50–1.35)
Calcium: 9.5 mg/dL (ref 8.4–10.5)
Chloride: 97 mEq/L (ref 96–112)
GFR calc Af Amer: >90 mL/min (ref >90)
GFR calc non Af Amer: >90 mL/min (ref >90)
Glucose, Bld: 278 mg/dL — ABNORMAL HIGH (ref 70–99)
Potassium: 4 mEq/L (ref 3.7–5.3)
Sodium: 137 mEq/L (ref 137–147)

## 2014-01-13 NOTE — Progress Notes (Signed)
Mr [name] history of chest pain . On and off for about 2 weeks.  Patient reported mid sternal; chest pain , sharp, 7/10 on pain scale, pain last 2- 3 minutes. Patient denies SOB, Lightheadedness or nausea or vomiting. Mr. [name] states that pain usually starts after doing an activity, yesterday, he was mowing the grass.

## 2014-01-13 NOTE — Pre-Procedure Instructions (Signed)
[name]  01/13/2014   Your procedure is scheduled on:  Thursday, May 14.  Report to [facility], Main Entrance [name]"    Admiting at 12:00 AM.  Call this number if you have problems the morning of surgery: [phone]   Remember:   Do not eat food or drink liquids after midnight Wednesday, May 13.   Take these medicines the morning of surgery with A SIP OF WATER: metoCLOPramide (REGLAN).              Take famotidine (PEPCID) if needed.               DO not take Insulin or Metformin the day of surgery.   i  Do not wear jewelry, make-up or nail polish.  Do not wear lotions, powders, or perfumes.   Men may shave face and neck.  Do not bring valuables to the hospital.              [facility] is not responsible for any belongings or valuables.               Contacts, dentures or bridgework may not be worn into surgery.  Leave suitcase in the car. After surgery it may be brought to your room.  For patients admitted to the hospital, discharge time is determined by yourtreatment team.               Patients discharged the day of surgery will not be allowed to drive home.  Name and phone number of your driver: -   Special Instructions: Review  [place] - Preparing For Surgery.   Please read over the following fact sheets that you were given: Pain Booklet, Coughing and Deep Breathing and Surgical Site Infection Prevention

## 2014-01-13 NOTE — Progress Notes (Signed)
Anesthesia PAT Review:  Patient is a 48 year old male scheduled for open recurrent ventral hernia repair on 01/20/14 by Dr. [name].  History includes poorly controlled DM2, HTN, HLD, GERD, dyspnea on exertion, anxiety, non-smoker, cholecystectomy, neuroendocrine carcinoid tumor of the ileum s/p resection '06. BMI is 34.59 consistent with obesity. PCP is Dr. Jenna Luo. HEM-ONC is Dr. Marcy Panning. Patient lives with his parents.    I was asked to see patient re: reports of intermittent chest pains X 2 weeks.  Upon further questioning, he reports that he has had two episodes.  First about two weeks ago and the second yesterday.  Both occurred when he was riding a riding lawn mover mowing his family's grass which takes 2-3 hours. Episodes were similar and happened about half way through his mowing.  He describes the pain as sudden onset, sharp, mid chest that lasted for 2-3 minutes.  It also went away abruptly.  There was no associated symptoms such as feeling light-headed, nauseated, arm/jaw pain, palpitations, SOB. He was already sweating because of the heat outside.  He did not stop mowing or do anything to make the pains disappear.  He is otherwise not very active.  He says he sits on the couch and plays video games most of the day.  He denies SOB at rest and LE edema.  He has chronic DOE with moderate activity.  He thinks his father may have had a heart attack in the past, but isn't sure and if so, he does not know at what age.  On exam, he is a obese, Caucasian male in NAD.  Heart RRR, no murmur. Lungs clear. No LE edema. He reports his fasting glucose levels tend to run 200 or lower.    EKG on 01/13/14 showed NSR, minimal voltage criteria for LVH.  CXR on 01/13/14 showed no active cardiopulmonary disease.  Preoperative labs noted.  He will get a fasting CBG on arrival.  His chest pain symptoms seen somewhat atypical.  There haven't been exertional symptoms. His EKG is overall unremarkable. I  reviewed above with anesthesiologist Dr. Orene Desanctis.  Unless patient with progressive, more typical, CV symptoms then it is anticipated that he can proceed as planned.  Further evaluation by his assigned anesthesiologist on the day of surgery.  George Hugh [facility]/Anesthesiology Phone [phone] 01/13/2014 4:04 PM

## 2014-01-19 MED ORDER — CEFAZOLIN SODIUM-DEXTROSE 2-3 GM-% IV SOLR
2.0000 g | INTRAVENOUS | Status: AC
  Administered 2014-01-20: 2 g via INTRAVENOUS
  Filled 2014-01-19: qty 50

## 2014-01-20 ENCOUNTER — Encounter (HOSPITAL_COMMUNITY): Payer: Self-pay | Admitting: Critical Care Medicine

## 2014-01-20 ENCOUNTER — Inpatient Hospital Stay (HOSPITAL_COMMUNITY)
Admission: RE | Admit: 2014-01-20 | Discharge: 2014-01-24 | DRG: 337 | Disposition: A | Discharge status: Home / Self Care | Payer: Medicare Other | Source: Ambulatory Visit | Attending: Surgery | Admitting: Surgery

## 2014-01-20 ENCOUNTER — Encounter (HOSPITAL_COMMUNITY): Payer: Medicare Other | Admitting: Vascular Surgery

## 2014-01-20 ENCOUNTER — Encounter (HOSPITAL_COMMUNITY)
Admission: RE | Disposition: A | Discharge status: Home / Self Care | Payer: Self-pay | Source: Ambulatory Visit | Attending: Surgery

## 2014-01-20 ENCOUNTER — Inpatient Hospital Stay (HOSPITAL_COMMUNITY): Payer: Medicare Other | Admitting: Certified Registered Nurse Anesthetist

## 2014-01-20 DIAGNOSIS — I10 Essential (primary) hypertension: Secondary | ICD-10-CM | POA: Diagnosis present

## 2014-01-20 DIAGNOSIS — K219 Gastro-esophageal reflux disease without esophagitis: Secondary | ICD-10-CM | POA: Diagnosis present

## 2014-01-20 DIAGNOSIS — E785 Hyperlipidemia, unspecified: Secondary | ICD-10-CM | POA: Diagnosis present

## 2014-01-20 DIAGNOSIS — K432 Incisional hernia without obstruction or gangrene: Principal | ICD-10-CM | POA: Diagnosis present

## 2014-01-20 DIAGNOSIS — Z85038 Personal history of other malignant neoplasm of large intestine: Secondary | ICD-10-CM | POA: Diagnosis not present

## 2014-01-20 DIAGNOSIS — E119 Type 2 diabetes mellitus without complications: Secondary | ICD-10-CM | POA: Diagnosis present

## 2014-01-20 DIAGNOSIS — K929 Disease of digestive system, unspecified: Secondary | ICD-10-CM | POA: Diagnosis not present

## 2014-01-20 DIAGNOSIS — R339 Retention of urine, unspecified: Secondary | ICD-10-CM | POA: Diagnosis not present

## 2014-01-20 DIAGNOSIS — K660 Peritoneal adhesions (postprocedural) (postinfection): Secondary | ICD-10-CM | POA: Diagnosis present

## 2014-01-20 DIAGNOSIS — Z79899 Other long term (current) drug therapy: Secondary | ICD-10-CM | POA: Diagnosis not present

## 2014-01-20 DIAGNOSIS — Z794 Long term (current) use of insulin: Secondary | ICD-10-CM | POA: Diagnosis not present

## 2014-01-20 DIAGNOSIS — D235 Other benign neoplasm of skin of trunk: Secondary | ICD-10-CM | POA: Diagnosis not present

## 2014-01-20 HISTORY — PX: INSERTION OF MESH: SHX5868

## 2014-01-20 HISTORY — PX: VENTRAL HERNIA REPAIR: SHX424

## 2014-01-20 HISTORY — PX: ABDOMINAL HERNIA REPAIR: SHX539

## 2014-01-20 HISTORY — DX: Type 2 diabetes mellitus without complications: E11.9

## 2014-01-20 HISTORY — DX: Calculus of kidney: N20.0

## 2014-01-20 LAB — CBC
HEMATOCRIT: 41.5 % (ref 39.0–52.0)
Hemoglobin: 14.9 g/dL (ref 13.0–17.0)
MCH: 34.6 pg — AB (ref 26.0–34.0)
MCHC: 35.9 g/dL (ref 30.0–36.0)
MCV: 96.3 fL (ref 78.0–100.0)
Platelets: 128 10*3/uL — ABNORMAL LOW (ref 150–400)
RBC: 4.31 MIL/uL (ref 4.22–5.81)
RDW: 13.7 % (ref 11.5–15.5)
WBC: 6.9 10*3/uL (ref 4.0–10.5)

## 2014-01-20 LAB — GLUCOSE, CAPILLARY
GLUCOSE-CAPILLARY: 209 mg/dL — AB (ref 70–99)
Glucose-Capillary: 244 mg/dL — ABNORMAL HIGH (ref 70–99)

## 2014-01-20 LAB — CREATININE, SERUM
Creatinine, Ser: 0.66 mg/dL (ref 0.50–1.35)
GFR calc Af Amer: >90 mL/min (ref >90)
GFR calc non Af Amer: >90 mL/min (ref >90)

## 2014-01-20 SURGERY — REPAIR, HERNIA, VENTRAL
Anesthesia: General | Site: Abdomen

## 2014-01-20 MED ORDER — PROPOFOL 10 MG/ML IV BOLUS
INTRAVENOUS | Status: AC
  Filled 2014-01-20: qty 20

## 2014-01-20 MED ORDER — LIDOCAINE HCL (CARDIAC) 20 MG/ML IV SOLN
INTRAVENOUS | Status: AC
  Filled 2014-01-20: qty 5

## 2014-01-20 MED ORDER — KCL IN DEXTROSE-NACL 20-5-0.45 MEQ/L-%-% IV SOLN
INTRAVENOUS | Status: DC
  Administered 2014-01-20 – 2014-01-21 (×2): via INTRAVENOUS
  Filled 2014-01-20 (×3): qty 1000

## 2014-01-20 MED ORDER — SODIUM CHLORIDE 0.9 % IJ SOLN: 9.0000 mL | INTRAMUSCULAR | Status: DC | PRN

## 2014-01-20 MED ORDER — NEOSTIGMINE METHYLSULFATE 10 MG/10ML IV SOLN
INTRAVENOUS | Status: DC | PRN
  Administered 2014-01-20: 5 mg via INTRAVENOUS

## 2014-01-20 MED ORDER — PROPOFOL 10 MG/ML IV BOLUS
INTRAVENOUS | Status: DC | PRN
  Administered 2014-01-20: 200 mg via INTRAVENOUS

## 2014-01-20 MED ORDER — ENOXAPARIN SODIUM 40 MG/0.4ML ~~LOC~~ SOLN
40.0000 mg | SUBCUTANEOUS | Status: DC
  Administered 2014-01-21 – 2014-01-24 (×4): 40 mg via SUBCUTANEOUS
  Filled 2014-01-20 (×6): qty 0.4

## 2014-01-20 MED ORDER — NEOSTIGMINE METHYLSULFATE 10 MG/10ML IV SOLN
INTRAVENOUS | Status: AC
  Filled 2014-01-20: qty 1

## 2014-01-20 MED ORDER — CHLORHEXIDINE GLUCONATE 4 % EX LIQD
1.0000 "application " | Freq: Once | CUTANEOUS | Status: DC
  Filled 2014-01-20: qty 15

## 2014-01-20 MED ORDER — LACTATED RINGERS IV SOLN
INTRAVENOUS | Status: DC
  Administered 2014-01-20 (×2): via INTRAVENOUS

## 2014-01-20 MED ORDER — SPIRONOLACTONE-HCTZ 25-25 MG PO TABS
1.0000 | ORAL_TABLET | Freq: Every day | ORAL | Status: DC | PRN
  Filled 2014-01-20: qty 1

## 2014-01-20 MED ORDER — ROCURONIUM BROMIDE 50 MG/5ML IV SOLN
INTRAVENOUS | Status: AC
  Filled 2014-01-20: qty 1

## 2014-01-20 MED ORDER — HYDROMORPHONE 0.3 MG/ML IV SOLN
INTRAVENOUS | Status: AC
  Filled 2014-01-20: qty 25

## 2014-01-20 MED ORDER — LIDOCAINE HCL (CARDIAC) 20 MG/ML IV SOLN
INTRAVENOUS | Status: DC | PRN
  Administered 2014-01-20: 100 mg via INTRAVENOUS

## 2014-01-20 MED ORDER — FENTANYL CITRATE 0.05 MG/ML IJ SOLN
INTRAMUSCULAR | Status: DC | PRN
  Administered 2014-01-20 (×2): 50 ug via INTRAVENOUS
  Administered 2014-01-20: 150 ug via INTRAVENOUS

## 2014-01-20 MED ORDER — DIPHENHYDRAMINE HCL 12.5 MG/5ML PO ELIX: 12.5000 mg | ORAL_SOLUTION | Freq: Four times a day (QID) | ORAL | Status: DC | PRN

## 2014-01-20 MED ORDER — PROMETHAZINE HCL 25 MG/ML IJ SOLN: 12.5000 mg | Freq: Once | INTRAMUSCULAR | Status: DC | PRN

## 2014-01-20 MED ORDER — GLYCOPYRROLATE 0.2 MG/ML IJ SOLN
INTRAMUSCULAR | Status: AC
  Filled 2014-01-20: qty 4

## 2014-01-20 MED ORDER — ONDANSETRON HCL 4 MG/2ML IJ SOLN: INTRAMUSCULAR | Status: DC | PRN

## 2014-01-20 MED ORDER — 0.9 % SODIUM CHLORIDE (POUR BTL) OPTIME
TOPICAL | Status: DC | PRN
  Administered 2014-01-20 (×2): 1000 mL

## 2014-01-20 MED ORDER — HYDROMORPHONE HCL PF 1 MG/ML IJ SOLN
0.2500 mg | INTRAMUSCULAR | Status: DC | PRN
  Administered 2014-01-20 (×2): 0.5 mg via INTRAVENOUS

## 2014-01-20 MED ORDER — METFORMIN HCL 500 MG PO TABS
1000.0000 mg | ORAL_TABLET | Freq: Two times a day (BID) | ORAL | Status: DC
  Administered 2014-01-21 – 2014-01-24 (×7): 1000 mg via ORAL
  Filled 2014-01-20 (×9): qty 2

## 2014-01-20 MED ORDER — GLYCOPYRROLATE 0.2 MG/ML IJ SOLN
INTRAMUSCULAR | Status: DC | PRN
  Administered 2014-01-20: .8 mg via INTRAVENOUS

## 2014-01-20 MED ORDER — ONDANSETRON HCL 4 MG/2ML IJ SOLN
4.0000 mg | Freq: Four times a day (QID) | INTRAMUSCULAR | Status: DC | PRN
  Filled 2014-01-20 (×2): qty 2

## 2014-01-20 MED ORDER — HYDROMORPHONE 0.3 MG/ML IV SOLN
INTRAVENOUS | Status: DC
  Administered 2014-01-20 (×2): 0.6 mg via INTRAVENOUS
  Administered 2014-01-20: 17:00:00 via INTRAVENOUS
  Administered 2014-01-21: 0.9 mg via INTRAVENOUS
  Administered 2014-01-21: 0.6 mg via INTRAVENOUS
  Administered 2014-01-21: 1.5 mg via INTRAVENOUS
  Administered 2014-01-21: 1.2 mg via INTRAVENOUS
  Administered 2014-01-22: 0.3 mg via INTRAVENOUS

## 2014-01-20 MED ORDER — MIDAZOLAM HCL 5 MG/5ML IJ SOLN
INTRAMUSCULAR | Status: DC | PRN
  Administered 2014-01-20: 2 mg via INTRAVENOUS

## 2014-01-20 MED ORDER — FENTANYL CITRATE 0.05 MG/ML IJ SOLN
INTRAMUSCULAR | Status: AC
  Filled 2014-01-20: qty 5

## 2014-01-20 MED ORDER — FAMOTIDINE IN NACL 20-0.9 MG/50ML-% IV SOLN
20.0000 mg | INTRAVENOUS | Status: DC
  Administered 2014-01-20 – 2014-01-23 (×4): 20 mg via INTRAVENOUS
  Filled 2014-01-20 (×5): qty 50

## 2014-01-20 MED ORDER — BUPIVACAINE-EPINEPHRINE (PF) 0.25% -1:200000 IJ SOLN
INTRAMUSCULAR | Status: AC
  Filled 2014-01-20: qty 30

## 2014-01-20 MED ORDER — ARTIFICIAL TEARS OP OINT
TOPICAL_OINTMENT | OPHTHALMIC | Status: DC | PRN
  Administered 2014-01-20: 1 via OPHTHALMIC

## 2014-01-20 MED ORDER — MEPERIDINE HCL 25 MG/ML IJ SOLN: 6.2500 mg | INTRAMUSCULAR | Status: DC | PRN

## 2014-01-20 MED ORDER — ONDANSETRON HCL 4 MG/2ML IJ SOLN
INTRAMUSCULAR | Status: DC | PRN
  Administered 2014-01-20: 4 mg via INTRAVENOUS

## 2014-01-20 MED ORDER — ONDANSETRON HCL 4 MG PO TABS: 4.0000 mg | ORAL_TABLET | Freq: Four times a day (QID) | ORAL | Status: DC | PRN

## 2014-01-20 MED ORDER — EPHEDRINE SULFATE 50 MG/ML IJ SOLN
INTRAMUSCULAR | Status: DC | PRN
  Administered 2014-01-20 (×3): 5 mg via INTRAVENOUS

## 2014-01-20 MED ORDER — MIDAZOLAM HCL 2 MG/2ML IJ SOLN
INTRAMUSCULAR | Status: AC
  Filled 2014-01-20: qty 2

## 2014-01-20 MED ORDER — ROCURONIUM BROMIDE 100 MG/10ML IV SOLN
INTRAVENOUS | Status: DC | PRN
  Administered 2014-01-20: 50 mg via INTRAVENOUS
  Administered 2014-01-20: 10 mg via INTRAVENOUS

## 2014-01-20 MED ORDER — DIPHENHYDRAMINE HCL 50 MG/ML IJ SOLN: 12.5000 mg | Freq: Four times a day (QID) | INTRAMUSCULAR | Status: DC | PRN

## 2014-01-20 MED ORDER — CEFAZOLIN SODIUM 1-5 GM-% IV SOLN
1.0000 g | Freq: Four times a day (QID) | INTRAVENOUS | Status: AC
  Administered 2014-01-20 – 2014-01-21 (×3): 1 g via INTRAVENOUS
  Filled 2014-01-20 (×3): qty 50

## 2014-01-20 MED ORDER — ONDANSETRON HCL 4 MG/2ML IJ SOLN
INTRAMUSCULAR | Status: AC
  Filled 2014-01-20: qty 2

## 2014-01-20 MED ORDER — ONDANSETRON HCL 4 MG/2ML IJ SOLN
4.0000 mg | Freq: Four times a day (QID) | INTRAMUSCULAR | Status: DC | PRN
Start: 2014-01-20 — End: 2014-01-24
  Administered 2014-01-21 – 2014-01-22 (×3): 4 mg via INTRAVENOUS
  Filled 2014-01-20: qty 2

## 2014-01-20 MED ORDER — NALOXONE HCL 0.4 MG/ML IJ SOLN: 0.4000 mg | INTRAMUSCULAR | Status: DC | PRN

## 2014-01-20 MED ORDER — HYDROMORPHONE HCL PF 1 MG/ML IJ SOLN
INTRAMUSCULAR | Status: AC
  Filled 2014-01-20: qty 1

## 2014-01-20 SURGICAL SUPPLY — 57 items
BENZOIN TINCTURE PRP APPL 2/3 (GAUZE/BANDAGES/DRESSINGS) IMPLANT
BLADE SURG ROTATE 9660 (MISCELLANEOUS) ×4 IMPLANT
CANISTER SUCTION 2500CC (MISCELLANEOUS) ×4 IMPLANT
CHLORAPREP W/TINT 26ML (MISCELLANEOUS) ×4 IMPLANT
CLOSURE WOUND 1/2 X4 (GAUZE/BANDAGES/DRESSINGS)
COVER SURGICAL LIGHT HANDLE (MISCELLANEOUS) ×4 IMPLANT
DRAIN CHANNEL 19F RND (DRAIN) ×4 IMPLANT
DRAPE LAPAROSCOPIC ABDOMINAL (DRAPES) ×4 IMPLANT
DRAPE UTILITY 15X26 W/TAPE STR (DRAPE) ×8 IMPLANT
DRSG MEPILEX BORDER 4X12 (GAUZE/BANDAGES/DRESSINGS) ×4 IMPLANT
ELECT BLADE 4.0 EZ CLEAN MEGAD (MISCELLANEOUS) ×4
ELECT CAUTERY BLADE 6.4 (BLADE) ×4 IMPLANT
ELECT REM PT RETURN 9FT ADLT (ELECTROSURGICAL) ×4
ELECTRODE BLDE 4.0 EZ CLN MEGD (MISCELLANEOUS) ×2 IMPLANT
ELECTRODE REM PT RTRN 9FT ADLT (ELECTROSURGICAL) ×2 IMPLANT
EVACUATOR SILICONE 100CC (DRAIN) ×4 IMPLANT
GLOVE BIO SURGEON STRL SZ7 (GLOVE) ×4 IMPLANT
GLOVE BIOGEL PI IND STRL 6.5 (GLOVE) ×2 IMPLANT
GLOVE BIOGEL PI IND STRL 7.0 (GLOVE) ×2 IMPLANT
GLOVE BIOGEL PI IND STRL 7.5 (GLOVE) ×2 IMPLANT
GLOVE BIOGEL PI IND STRL 8 (GLOVE) ×2 IMPLANT
GLOVE BIOGEL PI INDICATOR 6.5 (GLOVE) ×2
GLOVE BIOGEL PI INDICATOR 7.0 (GLOVE) ×2
GLOVE BIOGEL PI INDICATOR 7.5 (GLOVE) ×2
GLOVE BIOGEL PI INDICATOR 8 (GLOVE) ×2
GLOVE ECLIPSE 8.0 STRL XLNG CF (GLOVE) ×4 IMPLANT
GLOVE SURG SS PI 7.0 STRL IVOR (GLOVE) ×4 IMPLANT
GOWN STRL REUS W/ TWL LRG LVL3 (GOWN DISPOSABLE) ×6 IMPLANT
GOWN STRL REUS W/TWL LRG LVL3 (GOWN DISPOSABLE) ×6
KIT BASIN OR (CUSTOM PROCEDURE TRAY) ×4 IMPLANT
KIT ROOM TURNOVER OR (KITS) ×4 IMPLANT
MARKER SKIN DUAL TIP RULER LAB (MISCELLANEOUS) ×4 IMPLANT
MESH VENTRALIGHT ST 7X9N (Mesh General) ×4 IMPLANT
NEEDLE HYPO 25GX1X1/2 BEV (NEEDLE) IMPLANT
NS IRRIG 1000ML POUR BTL (IV SOLUTION) ×4 IMPLANT
PACK GENERAL/GYN (CUSTOM PROCEDURE TRAY) ×4 IMPLANT
PAD ARMBOARD 7.5X6 YLW CONV (MISCELLANEOUS) ×4 IMPLANT
RETAINER VISCERA MED (MISCELLANEOUS) ×4 IMPLANT
SPONGE GAUZE 4X4 12PLY (GAUZE/BANDAGES/DRESSINGS) IMPLANT
STAPLER VISISTAT 35W (STAPLE) ×4 IMPLANT
STRIP CLOSURE SKIN 1/2X4 (GAUZE/BANDAGES/DRESSINGS) IMPLANT
SUCTION POOLE TIP (SUCTIONS) ×4 IMPLANT
SUT ETHILON 2 0 FS 18 (SUTURE) ×4 IMPLANT
SUT MNCRL AB 4-0 PS2 18 (SUTURE) IMPLANT
SUT NOVA 1 T20/GS 25DT (SUTURE) ×12 IMPLANT
SUT NOVA NAB GS-21 0 18 T12 DT (SUTURE) ×20 IMPLANT
SUT NOVA NAB GS-21 1 T12 (SUTURE) IMPLANT
SUT SILK 2 0 SH CR/8 (SUTURE) ×4 IMPLANT
SUT VIC AB 3-0 SH 18 (SUTURE) ×8 IMPLANT
SUT VIC AB 3-0 SH 27 (SUTURE)
SUT VIC AB 3-0 SH 27XBRD (SUTURE) IMPLANT
SYR CONTROL 10ML LL (SYRINGE) IMPLANT
TOWEL OR 17X24 6PK STRL BLUE (TOWEL DISPOSABLE) IMPLANT
TOWEL OR 17X26 10 PK STRL BLUE (TOWEL DISPOSABLE) ×4 IMPLANT
TRAY FOLEY CATH 14FRSI W/METER (CATHETERS) IMPLANT
TRAY FOLEY CATH 16FR SILVER (SET/KITS/TRAYS/PACK) ×4 IMPLANT
WATER STERILE IRR 1000ML POUR (IV SOLUTION) IMPLANT

## 2014-01-20 NOTE — Transfer of Care (Signed)
Immediate Anesthesia Transfer of Care Note  Patient: [name]  Procedure(s) Performed: Procedure(s): OPEN REPAIR OF A RECURRENT VENTRAL HERNIA REPAIR (N/A) INSERTION OF MESH (N/A)  Patient Location: PACU  Anesthesia Type:General  Level of Consciousness: awake and alert   Airway & Oxygen Therapy: Patient Spontanous Breathing and Patient connected to nasal cannula oxygen  Post-op Assessment: Report given to PACU RN, Post -op Vital signs reviewed and stable and Patient moving all extremities X 4  Post vital signs: Reviewed and stable  Complications: No apparent anesthesia complications

## 2014-01-20 NOTE — Anesthesia Preprocedure Evaluation (Addendum)
Anesthesia Evaluation  Patient identified by MRN, date of birth, ID band Patient awake    Reviewed: Allergy & Precautions, H&P , NPO status , Patient's Chart, lab work & pertinent test results  Airway Mallampati: III TM Distance: >3 FB Neck ROM: Full    Dental no notable dental hx. (+) Edentulous Lower, Edentulous Upper   Pulmonary shortness of breath and with exertion,  breath sounds clear to auscultation  Pulmonary exam normal       Cardiovascular hypertension, Pt. on medications Rhythm:Regular Rate:Normal     Neuro/Psych PSYCHIATRIC DISORDERS Anxiety negative neurological ROS     GI/Hepatic Neg liver ROS, GERD-  Medicated,  Endo/Other  diabetes, Poorly Controlled, Type 2, Insulin Dependent, Oral Hypoglycemic AgentsMorbid obesityHx/o Malignant Carcinoid distal ileum with regional lymph node metastasis Hyperlipidemia  Renal/GU   negative genitourinary   Musculoskeletal   Abdominal (+) + obese,   Peds  Hematology negative hematology ROS (+)   Anesthesia Other Findings   Reproductive/Obstetrics negative OB ROS                        Anesthesia Physical Anesthesia Plan  ASA: II  Anesthesia Plan: General   Post-op Pain Management:    Induction: Intravenous  Airway Management Planned: Oral ETT  Additional Equipment:   Intra-op Plan:   Post-operative Plan: Extubation in OR  Informed Consent: I have reviewed the patients History and Physical, chart, labs and discussed the procedure including the risks, benefits and alternatives for the proposed anesthesia with the patient or authorized representative who has indicated his/her understanding and acceptance.   Dental advisory given  Plan Discussed with: Anesthesiologist, CRNA and Surgeon  Anesthesia Plan Comments:         Anesthesia Quick Evaluation

## 2014-01-20 NOTE — Anesthesia Procedure Notes (Signed)
Procedure Name: Intubation Date/Time: 01/20/2014 1:53 PM Performed by: Carola Frost Pre-anesthesia Checklist: Patient identified, Timeout performed, Emergency Drugs available, Suction available and Patient being monitored Patient Re-evaluated:Patient Re-evaluated prior to inductionOxygen Delivery Method: Circle system utilized Preoxygenation: Pre-oxygenation with 100% oxygen Intubation Type: IV induction Ventilation: Mask ventilation without difficulty and Oral airway inserted - appropriate to patient size Laryngoscope Size: Mac and 4 Grade View: Grade II Tube type: Oral Tube size: 7.5 mm Number of attempts: 1 Airway Equipment and Method: Stylet Placement Confirmation: CO2 detector,  positive ETCO2,  ETT inserted through vocal cords under direct vision and breath sounds checked- equal and bilateral Secured at: 23 cm Tube secured with: Tape Dental Injury: Teeth and Oropharynx as per pre-operative assessment

## 2014-01-20 NOTE — Interval H&P Note (Signed)
History and Physical Interval Note:  01/20/2014 11:50 AM  [name]  has presented today for surgery, with the diagnosis of recurrent ventral hernia  The various methods of treatment have been discussed with the patient and family. After consideration of risks, benefits and other options for treatment, the patient has consented to  Procedure(s): OPEN REPAIR RECURRENT VENTRAL HERNIA REPAIR WITH MESH (N/A) INSERTION OF MESH (N/A) as a surgical intervention .  The patient's history has been reviewed, patient examined, no change in status, stable for surgery.  I have reviewed the patient's chart and labs.  Questions were answered to the patient's satisfaction.     [name]. Yasir Kitner

## 2014-01-20 NOTE — H&P (View-Only) (Signed)
Patient ID: [name], male   DOB: [birth year], 48 y.o.   MRN: [id number]  Chief Complaint  Patient presents with  . Umbilical Hernia    HPI [name] is a 48 y.o. male.  Referred by Dr. Margaretmary Eddy for evaluation of recurrent ventral hernia  HPI This is a 48 year old male who underwent right hemicolectomy for neuroendocrine carcinoid tumor in 2009. His course was complicated by ileus and wound infection. He developed an incisional hernia. In June of 2010 he underwent laparoscopic incisional hernia repair with mesh for a 10 x 12 cm midline defect. This was repaired with a 20 x 26 cm piece of proceed mesh. This was performed by Dr. Ronnald Collum.  Over the last couple of years, he has developed progressively enlarging swelling around his midline incision. This has become fairly uncomfortable. He presents now for surgical evaluation.   Past Medical History  Diagnosis Date  . Cancer carcinoid  . Diabetes mellitus   . Hypertension   . GERD (gastroesophageal reflux disease)   . Ventral hernia   . Hyperlipemia   . UTI (lower urinary tract infection)   . Colon cancer   . Anxiety     PT. DENIES    Past Surgical History  Procedure Laterality Date  . Cholecystectomy    . Small intestine surgery    . Hernia repair    . Colon surgery      2009    Family History  Problem Relation Age of Onset  . Cancer Mother 21    breast cancer  . Breast cancer Mother   . Diabetes Mother   . Diabetes Sister   . Emphysema Father     Social History History  Substance Use Topics  . Smoking status: Never Smoker   . Smokeless tobacco: Never Used  . Alcohol Use: No    No Known Allergies  Current Outpatient Prescriptions  Medication Sig Dispense Refill  . ACCU-CHEK AVIVA PLUS test strip USE AS DIRECTED THREE TIMES DAILY  100 each  3  . B-D ULTRAFINE III SHORT PEN 31G X 8 MM MISC       . CRESTOR 20 MG tablet TAKE 1 TABLET BY MOUTH EVERY DAY  30 tablet  4  . famotidine (PEPCID) 20 MG tablet  Take 1 tablet (20 mg total) by mouth 2 (two) times daily as needed.  60 tablet  11  . Insulin Glargine (LANTUS SOLOSTAR) 100 UNIT/ML Solostar Pen INJECT 30 units in AM and 32 units in PM      . LANTUS SOLOSTAR 100 UNIT/ML Solostar Pen INJECT 30 UNITS SUBCUTANEOUS TWICE DAILY  15 mL  5  . metFORMIN (GLUCOPHAGE) 1000 MG tablet TAKE 1 TABLET BY MOUTH TWICE DAILY WITH A MEAL  60 tablet  5  . metoCLOPramide (REGLAN) 10 MG tablet Take 10 mg by mouth every morning.       No current facility-administered medications for this visit.    Review of Systems Review of Systems  Constitutional: Negative for fever, chills and unexpected weight change.  HENT: Negative for congestion, hearing loss, sore throat, trouble swallowing and voice change.   Eyes: Negative for visual disturbance.  Respiratory: Positive for cough. Negative for wheezing.   Cardiovascular: Negative for chest pain, palpitations and leg swelling.  Gastrointestinal: Positive for abdominal pain and abdominal distention. Negative for nausea, vomiting, diarrhea, constipation, blood in stool, anal bleeding and rectal pain.  Genitourinary: Negative for hematuria and difficulty urinating.  Musculoskeletal: Negative for arthralgias.  Skin: Negative for rash and wound.  Neurological: Negative for seizures, syncope, weakness and headaches.  Hematological: Negative for adenopathy. Does not bruise/bleed easily.  Psychiatric/Behavioral: Negative for confusion.    Blood pressure 120/80, pulse 79, temperature 97.1 F (36.2 C), resp. rate 16, height 5\' 11"  (1.803 m), weight 243 lb (110.224 kg).  Physical Exam Physical Exam WDWN in NAD HEENT:  EOMI, sclera anicteric Neck:  No masses, no thyromegaly Lungs:  CTA bilaterally; normal respiratory effort CV:  Regular rate and rhythm; no murmurs Abd:  +bowel sounds, obese; healed right subcostal incision; healed midline incision; protuberant midline ventral hernia - reducible Ext:  Well-perfused; no  edema Skin:  Warm, dry; no sign of jaundice  Data Reviewed CLINICAL DATA: History of carcinoid tumor of the colon. Status post  partial colectomy. Cholecystectomy. Worsening abdominal pain.  Diabetes.  EXAM:  CT ABDOMEN AND PELVIS WITH CONTRAST  TECHNIQUE:  Multidetector CT imaging of the abdomen and pelvis was performed  using the standard protocol following bolus administration of  intravenous contrast.  CONTRAST: 116mL OMNIPAQUE IOHEXOL 300 MG/ML SOLN  COMPARISON: NM GASTRIC EMPTYING dated 04/28/2012; CT ABD/PELVIS W CM  dated 02/18/2012  FINDINGS:  Lower Chest: Clear lung bases. Normal heart size without pericardial  or pleural effusion.  Abdomen/Pelvis: Arterial phase images demonstrate no hypervascular  lesions within the liver, pancreas, or kidneys.  Portal venous phase images demonstrate moderate hepatic steatosis.  Hepatomegaly, greater than 20 cm craniocaudal. Mild prominence of  the caudate lobe, without specific evidence of cirrhosis.  Normal spleen, stomach, pancreas. Cholecystectomy without biliary  ductal dilatation. Normal adrenal glands and right kidney. A lower  pole left renal cyst measures 1.7 cm. Mild but age advanced aortic  atherosclerosis. Porta hepatis adenopathy. A portal caval node  measures 1.9 x 3.5 cm on image 58. This is similar to on the prior  exam.  Surgical changes of right hemicolectomy. Normal appearance of small  bowel loops, without ascites. No evidence of omental or peritoneal  disease. No mesenteric adenopathy.  No pelvic adenopathy. Normal urinary bladder and prostate. No  significant free fluid.  Prior ventral abdominal wall hernia repair.  Bones/Musculoskeletal: Congenitally short lumbar pedicles. This  contributes to borderline central canal stenosis throughout the  lumbar spine.  IMPRESSION:  1. No acute process or evidence of metastatic disease in the abdomen  or pelvis.  2. Hepatic steatosis and hepatomegaly. Chronic mild porta  hepatis  adenopathy, favored to be reactive and related to steatosis.  Electronically Signed  By: Abigail Miyamoto [name].  On: 11/08/2013 08:19   Assessment    Recurrent ventral hernia - it appears that the entire mesh has eventrated into the ventral hernia sac.     Plan    Open ventral hernia repair with mesh.  The surgical procedure has been discussed with the patient.  Potential risks, benefits, alternative treatments, and expected outcomes have been explained.  All of the patient's questions at this time have been answered.  The likelihood of reaching the patient's treatment goal is good.  The patient understand the proposed surgical procedure and wishes to proceed.         [name]. Xue Low 01/03/2014, 12:58 PM

## 2014-01-20 NOTE — Op Note (Signed)
Preop diagnosis: Recurrent upper midline ventral hernia Postop diagnosis: Same Procedure performed: Exploratory laparotomy, extensive lysis of adhesions, explantation of old hernia mesh, ventral hernia repair with onlay mesh Surgeon:Taimi Towe K. Lucerito Rosinski Assistant: Dr. Jackolyn Confer Anesthesia: Gen. endotracheal Indications: This is a 48 year old male who has undergone previous open cholecystectomy as well as right hemicolectomy for carcinoid tumor. He had a wound infection. In 2010 he underwent laparoscopic incisional hernia repair with mesh. This was repaired with 20 x 26 cm Proceed mesh.  He has developed progressive swelling around his midline incision. A CT scan showed a recurrent hernia with apparent eventration of abdominal wall holding the mesh.  Description of procedure: The patient brought to the operating room placed in supine position on the operating room table. After an adequate level of general anesthesia was obtained, the patient's abdomen was shaved, prepped with chlor prep and draped in sterile fashion. A Foley catheter had been placed by nursing. A timeout was taken to ensure the proper patient and proper procedure. We made upper limit on incision and excised his previous scar. Dissection was carried down the subcutaneous tissues with cautery. We entered the hernia sac sharply. We were able to identify the fascia on the sides of our incision which appeared relatively thick. The hernia mesh had become detached from the upper end of his previous hernia repair. We were able to dissect around the operation the mesh. The patient has a lot of very dense adhesions to the undersurface of his previous mesh. We spent about 1 hour dissecting the omentum and small bowel away from the posterior surface of the mesh. We methodically took the adhesions down and excised the mesh from the fascia. We continued all the way down to the area just behind the umbilicus. The mesh was removed entirely. We inspected  the small bowel. There is a single serosal tear that was repaired with 3-0 silk sutures. There is no contamination. We irrigated the abdomen thoroughly. We examined the fascia. The fascia was quite tenuous and had several defects posteriorly where the mesh had been explanted. It did not appear that we would be able to perform a rectal muscular repair as there would be very little posterior sheath to close. The patient's abdominal wall actually come together primarily quite well. We made the decision to close his fascia primarily and to perform onlay mesh repair. We closed the fascia with multiple #1 Novafil sutures. These were placed in a figure-of-eight interrupted fashion to reapproximate the fascial edges. We then placed onlay mesh with ventral light mesh. We used an 18 x 22 cm oval which was cut to fit.  We had raised subcutaneous flaps earlier with cautery. The mesh was sutured to the anterior sheath of the muscle with interrupted 0 Novafil sutures. Several Novafil sutures were used to tack down the Center of the mesh as well.  A 19 [place] Blake drain was inserted through a stab incision and placed on top of the mesh. The subcutaneous tissues were closed with figure-of-eight 3-0 Vicryl sutures. Staples were used to close the skin. The drain was secured with 2-0 nylon. This was placed to bulb suction. A clean dressing was applied. The patient was then extubated and brought to recovery room stable condition. All sponge, initially, and needle counts are correct.  [name]. [name], MD, [facility]  01/20/2014 4:33 PM

## 2014-01-20 NOTE — Anesthesia Postprocedure Evaluation (Signed)
  Anesthesia Post-op Note  Patient: [name]  Procedure(s) Performed: Procedure(s): OPEN REPAIR OF A RECURRENT VENTRAL HERNIA REPAIR (N/A) INSERTION OF MESH (N/A)  Patient Location: PACU  Anesthesia Type:General  Level of Consciousness: awake, alert  and oriented  Airway and Oxygen Therapy: Patient Spontanous Breathing and Patient connected to nasal cannula oxygen  Post-op Pain: mild  Post-op Assessment: Post-op Vital signs reviewed, Patient's Cardiovascular Status Stable, Respiratory Function Stable, Patent Airway, No signs of Nausea or vomiting and Pain level controlled  Post-op Vital Signs: Reviewed and stable  Last Vitals:  Filed Vitals:   01/20/14 1715  BP:   Pulse: 97  Temp:   Resp: 27    Complications: No apparent anesthesia complications

## 2014-01-21 ENCOUNTER — Encounter (HOSPITAL_COMMUNITY): Payer: Self-pay | Admitting: Surgery

## 2014-01-21 LAB — CBC
HEMATOCRIT: 42.5 % (ref 39.0–52.0)
HEMOGLOBIN: 14.9 g/dL (ref 13.0–17.0)
MCH: 34.1 pg — ABNORMAL HIGH (ref 26.0–34.0)
MCHC: 35.1 g/dL (ref 30.0–36.0)
MCV: 97.3 fL (ref 78.0–100.0)
Platelets: 135 10*3/uL — ABNORMAL LOW (ref 150–400)
RBC: 4.37 MIL/uL (ref 4.22–5.81)
RDW: 13.9 % (ref 11.5–15.5)
WBC: 6.1 10*3/uL (ref 4.0–10.5)

## 2014-01-21 LAB — BASIC METABOLIC PANEL
BUN: 5 mg/dL — ABNORMAL LOW (ref 6–23)
CHLORIDE: 103 meq/L (ref 96–112)
CO2: 24 meq/L (ref 19–32)
Calcium: 8.6 mg/dL (ref 8.4–10.5)
Creatinine, Ser: 0.57 mg/dL (ref 0.50–1.35)
GFR calc Af Amer: >90 mL/min (ref >90)
GFR calc non Af Amer: >90 mL/min (ref >90)
GLUCOSE: 269 mg/dL — AB (ref 70–99)
Potassium: 3.7 mEq/L (ref 3.7–5.3)
SODIUM: 141 meq/L (ref 137–147)

## 2014-01-21 MED ORDER — KETOROLAC TROMETHAMINE 30 MG/ML IJ SOLN
30.0000 mg | Freq: Four times a day (QID) | INTRAMUSCULAR | Status: DC
  Administered 2014-01-21 – 2014-01-24 (×13): 30 mg via INTRAVENOUS
  Filled 2014-01-21 (×21): qty 1

## 2014-01-21 MED ORDER — LACTATED RINGERS IV SOLN
INTRAVENOUS | Status: DC
  Administered 2014-01-21 – 2014-01-23 (×6): via INTRAVENOUS

## 2014-01-21 NOTE — Progress Notes (Signed)
Orthopedic Tech Progress Note Patient Details:  [name] 01-21-1966 [id number] Delivered to 6N desk Ortho Devices Type of Ortho Device: Abdominal binder Ortho Device/Splint Interventions: Ordered   Asia Mellody Memos 01/21/2014, 1:57 PM

## 2014-01-21 NOTE — Progress Notes (Addendum)
Inpatient Diabetes Program Recommendations  AACE/ADA: New Consensus Statement on Inpatient Glycemic Control (2013)  Target Ranges:  Prepandial:   less than 140 mg/dL      Peak postprandial:   less than 180 mg/dL (1-2 hours)      Critically ill patients:  140 - 180 mg/dL      Results for DENNIS, HEGEMAN (MRN [id number]) as of 01/21/2014 08:45  Ref. Range 01/20/2014 11:25 01/20/2014 16:39  Glucose-Capillary Latest Range: 70-99 mg/dL 244 (H) 209 (H)     Admit for Hernia repair.  History of DM2, HTN.   Home DM Meds: Lantus 40 units AM/ 42 units PM       Metformin 1000 mg bid   Patient POD #1 hernia repair.  Glucose levels elevated.  Patient currently only receiving Home dose of Metformin.   MD- Please consider the following to improve patient's CBG control:  1. Start Novolog Moderate SSI tid ac + HS 2. Start 1/2 home dose of Lantus- Lantus 20 units bid    Will follow Wyn Quaker RN, MSN, CDE Diabetes Coordinator Inpatient Diabetes Program Team Pager: [phone] (8a-10p)

## 2014-01-21 NOTE — Progress Notes (Signed)
Utilization review completed. Adanely Reynoso, RN, BSN. 

## 2014-01-21 NOTE — Progress Notes (Signed)
1 Day Post-Op  Subjective: Patient states that he is pretty sore, but he feels better than pre-op Mild nausea No flatus or BM yet PCA working well.  Objective: Vital signs in last 24 hours: Temp:  [97.2 F (36.2 C)-98.4 F (36.9 C)] 98.2 F (36.8 C) (05/15 0511) Pulse Rate:  [77-114] 111 (05/15 0511) Resp:  [14-32] 31 (05/15 0610) BP: (127-154)/(68-94) 133/82 mmHg (05/15 0511) SpO2:  [91 %-97 %] 92 % (05/15 0610) Weight:  [236 lb 1.8 oz (107.1 kg)-245 lb (111.131 kg)] 236 lb 1.8 oz (107.1 kg) (05/15 0610)    Intake/Output from previous day: 05/14 0701 - 05/15 0700 In: 3080 [P.O.:240; I.V.:2840] Out: 2095 [Urine:1950; Drains:70; Blood:75] Intake/Output this shift:    General appearance: alert, cooperative and no distress Resp: clear to auscultation bilaterally Cardio: regular rate and rhythm, S1, S2 normal, no murmur, click, rub or gallop GI: mildly distended; hypoactive bowel sounds Dressing dry; drain - serosanguinous drainage  Lab Results:   Recent Labs  01/20/14 1933 01/21/14 0414  WBC 6.9 6.1  HGB 14.9 14.9  HCT 41.5 42.5  PLT 128* 135*   BMET  Recent Labs  01/20/14 1933 01/21/14 0414  NA  --  141  K  --  3.7  CL  --  103  CO2  --  24  GLUCOSE  --  269*  BUN  --  5*  CREATININE 0.66 0.57  CALCIUM  --  8.6   PT/INR No results found for this basename: LABPROT, INR,  in the last 72 hours ABG No results found for this basename: PHART, PCO2, PO2, HCO3,  in the last 72 hours  Studies/Results: No results found.  Anti-infectives: Anti-infectives   Start     Dose/Rate Route Frequency Ordered Stop   01/20/14 2000  ceFAZolin (ANCEF) IVPB 1 g/50 mL premix     1 g 100 mL/hr over 30 Minutes Intravenous Every 6 hours 01/20/14 1803 01/21/14 1359   01/20/14 0600  ceFAZolin (ANCEF) IVPB 2 g/50 mL premix     2 g 100 mL/hr over 30 Minutes Intravenous On call to O.R. 01/19/14 1618 01/20/14 1359      Assessment/Plan: s/p Procedure(s): OPEN REPAIR OF A  RECURRENT VENTRAL HERNIA REPAIR (N/A) INSERTION OF MESH (N/A) Continue clear liquids until better bowel function Ambulate Elastic abdominal binder Toradol   LOS: 1 day    [name]. Cataleah Stites 01/21/2014

## 2014-01-22 DIAGNOSIS — K432 Incisional hernia without obstruction or gangrene: Secondary | ICD-10-CM | POA: Diagnosis not present

## 2014-01-22 DIAGNOSIS — K660 Peritoneal adhesions (postprocedural) (postinfection): Secondary | ICD-10-CM | POA: Diagnosis not present

## 2014-01-22 DIAGNOSIS — R339 Retention of urine, unspecified: Secondary | ICD-10-CM

## 2014-01-22 DIAGNOSIS — K929 Disease of digestive system, unspecified: Secondary | ICD-10-CM | POA: Diagnosis not present

## 2014-01-22 LAB — CBC
HCT: 40 % (ref 39.0–52.0)
Hemoglobin: 13.7 g/dL (ref 13.0–17.0)
MCH: 33.5 pg (ref 26.0–34.0)
MCHC: 34.3 g/dL (ref 30.0–36.0)
MCV: 97.8 fL (ref 78.0–100.0)
Platelets: 121 10*3/uL — ABNORMAL LOW (ref 150–400)
RBC: 4.09 MIL/uL — AB (ref 4.22–5.81)
RDW: 13.7 % (ref 11.5–15.5)
WBC: 4.7 10*3/uL (ref 4.0–10.5)

## 2014-01-22 LAB — GLUCOSE, CAPILLARY
GLUCOSE-CAPILLARY: 223 mg/dL — AB (ref 70–99)
GLUCOSE-CAPILLARY: 182 mg/dL — AB (ref 70–99)
Glucose-Capillary: 233 mg/dL — ABNORMAL HIGH (ref 70–99)
Glucose-Capillary: 229 mg/dL — ABNORMAL HIGH (ref 70–99)

## 2014-01-22 LAB — HEMOGLOBIN A1C
HEMOGLOBIN A1C: 9.1 % — AB (ref <5.7)
Mean Plasma Glucose: 214 mg/dL — ABNORMAL HIGH (ref <117)

## 2014-01-22 LAB — BASIC METABOLIC PANEL
BUN: 4 mg/dL — ABNORMAL LOW (ref 6–23)
CHLORIDE: 102 meq/L (ref 96–112)
CO2: 24 meq/L (ref 19–32)
CREATININE: 0.62 mg/dL (ref 0.50–1.35)
Calcium: 8.3 mg/dL — ABNORMAL LOW (ref 8.4–10.5)
GFR calc Af Amer: >90 mL/min (ref >90)
GFR calc non Af Amer: >90 mL/min (ref >90)
Glucose, Bld: 211 mg/dL — ABNORMAL HIGH (ref 70–99)
Potassium: 3.5 mEq/L — ABNORMAL LOW (ref 3.7–5.3)
SODIUM: 139 meq/L (ref 137–147)

## 2014-01-22 MED ORDER — INSULIN ASPART 100 UNIT/ML ~~LOC~~ SOLN
6.0000 [IU] | Freq: Three times a day (TID) | SUBCUTANEOUS | Status: DC
  Administered 2014-01-22 – 2014-01-24 (×5): 6 [IU] via SUBCUTANEOUS

## 2014-01-22 MED ORDER — INSULIN ASPART 100 UNIT/ML ~~LOC~~ SOLN: 0.0000 [IU] | Freq: Three times a day (TID) | SUBCUTANEOUS | Status: DC

## 2014-01-22 MED ORDER — MORPHINE SULFATE 2 MG/ML IJ SOLN
2.0000 mg | INTRAMUSCULAR | Status: DC | PRN
Start: 2014-01-22 — End: 2014-01-24

## 2014-01-22 MED ORDER — INSULIN ASPART 100 UNIT/ML ~~LOC~~ SOLN: 0.0000 [IU] | Freq: Every day | SUBCUTANEOUS | Status: DC

## 2014-01-22 MED ORDER — TAMSULOSIN HCL 0.4 MG PO CAPS
0.4000 mg | ORAL_CAPSULE | Freq: Every day | ORAL | Status: DC
  Administered 2014-01-22 – 2014-01-24 (×3): 0.4 mg via ORAL
  Filled 2014-01-22 (×3): qty 1

## 2014-01-22 MED ORDER — INSULIN ASPART 100 UNIT/ML ~~LOC~~ SOLN
0.0000 [IU] | Freq: Three times a day (TID) | SUBCUTANEOUS | Status: DC
  Administered 2014-01-22 (×2): 7 [IU] via SUBCUTANEOUS
  Administered 2014-01-23: 4 [IU] via SUBCUTANEOUS
  Administered 2014-01-23: 7 [IU] via SUBCUTANEOUS
  Administered 2014-01-23: 3 [IU] via SUBCUTANEOUS
  Administered 2014-01-24: 4 [IU] via SUBCUTANEOUS

## 2014-01-22 NOTE — Progress Notes (Signed)
2 Days Post-Op  Subjective: Minimal pain - using PCA sparingly No BM or flatus Mild nausea Required reinsertion of Foley for urinary retention  Objective: Vital signs in last 24 hours: Temp:  [98.3 F (36.8 C)-98.9 F (37.2 C)] 98.3 F (36.8 C) (05/16 0555) Pulse Rate:  [102-113] 108 (05/16 0555) Resp:  [18-26] 21 (05/16 0830) BP: (105-142)/(70-90) 142/90 mmHg (05/16 0555) SpO2:  [90 %-96 %] 96 % (05/16 0830) Weight:  [229 lb 12.8 oz (104.237 kg)] 229 lb 12.8 oz (104.237 kg) (05/16 0555) Last BM Date: 01/15/14  Intake/Output from previous day: 05/15 0701 - 05/16 0700 In: 4288 [P.O.:118; I.V.:3320] Out: 2605 [Urine:2525; Drains:80] Intake/Output this shift:    General appearance: alert, cooperative, no distress and sitting up in chair Resp: clear to auscultation bilaterally Cardio: regular rate and rhythm, S1, S2 normal, no murmur, click, rub or gallop GI: mildly distended; hypoactive bowel sounds; incisional tenderness Incision -staple line c/d/i  Lab Results:   Recent Labs  01/21/14 0414 01/22/14 0530  WBC 6.1 4.7  HGB 14.9 13.7  HCT 42.5 40.0  PLT 135* 121*   BMET  Recent Labs  01/21/14 0414 01/22/14 0530  NA 141 139  K 3.7 3.5*  CL 103 102  CO2 24 24  GLUCOSE 269* 211*  BUN 5* 4*  CREATININE 0.57 0.62  CALCIUM 8.6 8.3*   PT/INR No results found for this basename: LABPROT, INR,  in the last 72 hours ABG No results found for this basename: PHART, PCO2, PO2, HCO3,  in the last 72 hours  Studies/Results: No results found.  Anti-infectives: Anti-infectives   Start     Dose/Rate Route Frequency Ordered Stop   01/20/14 2000  ceFAZolin (ANCEF) IVPB 1 g/50 mL premix     1 g 100 mL/hr over 30 Minutes Intravenous Every 6 hours 01/20/14 1803 01/21/14 0844   01/20/14 0600  ceFAZolin (ANCEF) IVPB 2 g/50 mL premix     2 g 100 mL/hr over 30 Minutes Intravenous On call to O.R. 01/19/14 1618 01/20/14 1359      Assessment/Plan: s/p Procedure(s): OPEN  REPAIR OF A RECURRENT VENTRAL HERNIA REPAIR (N/A) INSERTION OF MESH (N/A) Continue Foley for urinary retention Flomax D/C PCA - intermittent morphine Continue clears until bowel function returns  LOS: 2 days    [name]. [name] 01/22/2014

## 2014-01-22 NOTE — Progress Notes (Signed)
Had to replace foley cath per order. Pt was unable to void on own. Pt had already been In & Out cath x's. By 0600 this morning pt was still unable to void on own. Bladder scan pt and pt had 840cc in bladder, so placed foley cath per order.

## 2014-01-23 LAB — GLUCOSE, CAPILLARY
GLUCOSE-CAPILLARY: 204 mg/dL — AB (ref 70–99)
GLUCOSE-CAPILLARY: 166 mg/dL — AB (ref 70–99)
Glucose-Capillary: 180 mg/dL — ABNORMAL HIGH (ref 70–99)
Glucose-Capillary: 144 mg/dL — ABNORMAL HIGH (ref 70–99)

## 2014-01-23 NOTE — Progress Notes (Signed)
3 Days Post-Op  Subjective: PT ON PHONE NO COMPLAINTS  Objective: Vital signs in last 24 hours: Temp:  [97.5 F (36.4 C)-98.8 F (37.1 C)] 97.5 F (36.4 C) (05/17 0453) Pulse Rate:  [100-107] 100 (05/17 0453) Resp:  [16-18] 18 (05/17 0453) BP: (100-137)/(57-84) 100/57 mmHg (05/17 0453) SpO2:  [92 %-98 %] 98 % (05/17 0453) Last BM Date: 01/23/14  Intake/Output from previous day: 05/16 0701 - 05/17 0700 In: 3121 [P.O.:1080; I.V.:2041] Out: 1725 [Urine:1700; Drains:25] Intake/Output this shift: Total I/O In: 360 [P.O.:360] Out: 500 [Urine:500]  Incision/Wound:DRESSING DRY. ABDOMEN SOFT. MIN PAIN  Lab Results:   Recent Labs  01/21/14 0414 01/22/14 0530  WBC 6.1 4.7  HGB 14.9 13.7  HCT 42.5 40.0  PLT 135* 121*   BMET  Recent Labs  01/21/14 0414 01/22/14 0530  NA 141 139  K 3.7 3.5*  CL 103 102  CO2 24 24  GLUCOSE 269* 211*  BUN 5* 4*  CREATININE 0.57 0.62  CALCIUM 8.6 8.3*   PT/INR No results found for this basename: LABPROT, INR,  in the last 72 hours ABG No results found for this basename: PHART, PCO2, PO2, HCO3,  in the last 72 hours  Studies/Results: No results found.  Anti-infectives: Anti-infectives   Start     Dose/Rate Route Frequency Ordered Stop   01/20/14 2000  ceFAZolin (ANCEF) IVPB 1 g/50 mL premix     1 g 100 mL/hr over 30 Minutes Intravenous Every 6 hours 01/20/14 1803 01/21/14 0844   01/20/14 0600  ceFAZolin (ANCEF) IVPB 2 g/50 mL premix     2 g 100 mL/hr over 30 Minutes Intravenous On call to O.R. 01/19/14 1618 01/20/14 1359      Assessment/Plan: s/p Procedure(s): OPEN REPAIR OF A RECURRENT VENTRAL HERNIA REPAIR (N/A) INSERTION OF MESH (N/A) Urinary retention voiding trial today Adv diet  OOB  LOS: 3 days    Maral Lampe A. Cathalina Barcia 01/23/2014

## 2014-01-24 ENCOUNTER — Telehealth (INDEPENDENT_AMBULATORY_CARE_PROVIDER_SITE_OTHER): Payer: Self-pay | Admitting: General Surgery

## 2014-01-24 LAB — GLUCOSE, CAPILLARY: Glucose-Capillary: 179 mg/dL — ABNORMAL HIGH (ref 70–99)

## 2014-01-24 MED ORDER — OXYCODONE-ACETAMINOPHEN 5-325 MG PO TABS: 1.0000 | ORAL_TABLET | ORAL | Status: DC | PRN

## 2014-01-24 NOTE — Progress Notes (Signed)
Patient, mother and father given discharge instructions.  They verbalized understanding of all instructions and follow-up appointment.  Prescription for pain medicine given.  No questions or concerns at this time.  Patient ready for discharge home with parents.  Taken down via wheelchair to go home.

## 2014-01-24 NOTE — Discharge Instructions (Signed)
[facility]      [facility] [phone]  OPEN ABDOMINAL SURGERY: POST OP INSTRUCTIONS  Always review your discharge instruction sheet given to you by the facility where your surgery was performed.  IF YOU HAVE DISABILITY OR FAMILY LEAVE FORMS, YOU MUST BRING THEM TO THE OFFICE FOR PROCESSING.  PLEASE DO NOT GIVE THEM TO YOUR DOCTOR.  1. A prescription for pain medication may be given to you upon discharge.  Take your pain medication as prescribed, if needed.  If narcotic pain medicine is not needed, then you may take acetaminophen (Tylenol) or ibuprofen (Advil) as needed. 2. Take your usually prescribed medications unless otherwise directed. 3. If you need a refill on your pain medication, please contact your pharmacy. They will contact our office to request authorization.  Prescriptions will not be filled after 5pm or on week-ends. 4. You should follow a light diet the first few days after arrival home, such as soup and crackers, pudding, etc.unless your doctor has advised otherwise. A high-fiber, low fat diet can be resumed as tolerated.   Be sure to include lots of fluids daily. Most patients will experience some swelling and bruising on the chest and neck area.  Ice packs will help.  Swelling and bruising can take several days to resolve 5. Most patients will experience some swelling and bruising in the area of the incision. Ice pack will help. Swelling and bruising can take several days to resolve..  6. It is common to experience some constipation if taking pain medication after surgery.  Increasing fluid intake and taking a stool softener will usually help or prevent this problem from occurring.  A mild laxative (Milk of Magnesia or Miralax) should be taken according to package directions if there are no bowel movements after 48 hours. 7.  You may have steri-strips (small skin tapes) in place directly over the incision.  These strips should be left on the skin for 7-10 days.  If your  surgeon used skin glue on the incision, you may shower in 24 hours.  The glue will flake off over the next 2-3 weeks.  Any sutures or staples will be removed at the office during your follow-up visit. You may find that a light gauze bandage over your incision may keep your staples from being rubbed or pulled. You may shower and replace the bandage daily. 8. ACTIVITIES:  You may resume regular (light) daily activities beginning the next day--such as daily self-care, walking, climbing stairs--gradually increasing activities as tolerated.  You may have sexual intercourse when it is comfortable.  Refrain from any heavy lifting or straining until approved by your doctor. a. You may drive when you no longer are taking prescription pain medication, you can comfortably wear a seatbelt, and you can safely maneuver your car and apply brakes b. Return to Work: ___________________________________ 9. You should see your doctor in the office for a follow-up appointment approximately two weeks after your surgery.  Make sure that you call for this appointment within a day or two after you arrive home to insure a convenient appointment time. OTHER INSTRUCTIONS:  _____________________________________________________________ _____________________________________________________________  WHEN TO CALL YOUR DOCTOR: 1. Fever over 101.0 2. Inability to urinate 3. Nausea and/or vomiting 4. Extreme swelling or bruising 5. Continued bleeding from incision. 6. Increased pain, redness, or drainage from the incision. 7. Difficulty swallowing or breathing 8. Muscle cramping or spasms. 9. Numbness or tingling in hands or feet or around lips.  The clinic staff is available to   answer your questions during regular business hours.  Please don't hesitate to call and ask to speak to one of the nurses if you have concerns.  For further questions, please visit [email or web address]   

## 2014-01-24 NOTE — Telephone Encounter (Signed)
Called patient to let him know that he has an apt for staple removal per Dr [name] . Patient is coming in on 02-02-14 @ 10:00am

## 2014-01-24 NOTE — Discharge Summary (Signed)
Physician Discharge Summary  Patient ID: [name] MRN: [id number] DOB/AGE: [birth year] 48 y.o.  Admit date: 01/20/2014 Discharge date: 01/24/2014  Admission Diagnoses:  Recurrent ventral hernia  Discharge Diagnoses: Recurrent ventral hernia                                        Urinary retention Active Problems:   Recurrent ventral incisional hernia   Discharged Condition: good  Hospital Course: Open repair of recurrent ventral hernia with explantation of old mesh and onlay mesh repair.  Had some urinary retention requiring replacement of Foley.  Improved with Flomax.  Pain well-controlled.  Had BM and is now ready for discharge.  Consults: None  Significant Diagnostic Studies: none  Treatments: surgery: as above  Discharge Exam: Blood pressure 142/85, pulse 85, temperature 98.2 F (36.8 C), temperature source Oral, resp. rate 18, height 5\' 11"  (1.803 m), weight 228 lb 2.8 oz (103.5 kg), SpO2 96.00%. General appearance: alert, cooperative and no distress GI: soft, minimal tenderness; active bowel sounds Incision c/d/i; drain removed  Disposition: 01-Home or Self Care  Discharge Instructions   Call MD for:  persistant nausea and vomiting    Complete by:  As directed      Call MD for:  redness, tenderness, or signs of infection (pain, swelling, redness, odor or green/yellow discharge around incision site)    Complete by:  As directed      Call MD for:  severe uncontrolled pain    Complete by:  As directed      Call MD for:  temperature >100.4    Complete by:  As directed      Diet general    Complete by:  As directed      Discharge wound care:    Complete by:  As directed   Dry gauze to drain site - change daily Dry gauze over staples - change daily You may shower     Driving Restrictions    Complete by:  As directed   Do not drive while taking pain medications     Increase activity slowly    Complete by:  As directed      May shower / Bathe    Complete by:  As  directed      May walk up steps    Complete by:  As directed             Medication List         ACCU-CHEK AVIVA PLUS test strip  Generic drug:  glucose blood  USE AS DIRECTED THREE TIMES DAILY     famotidine 20 MG tablet  Commonly known as:  PEPCID  Take 20 mg by mouth 2 (two) times daily as needed for heartburn or indigestion.     insulin glargine 100 UNIT/ML injection  Commonly known as:  LANTUS  Inject 40-42 Units into the skin 2 (two) times daily. 40 units in the morning and 42 units at bedtime.     metFORMIN 1000 MG tablet  Commonly known as:  GLUCOPHAGE  Take 1,000 mg by mouth 2 (two) times daily with a meal.     metoCLOPramide 10 MG tablet  Commonly known as:  REGLAN  Take 10 mg by mouth every morning.     oxyCODONE-acetaminophen 5-325 MG per tablet  Commonly known as:  PERCOCET/ROXICET  Take 1 tablet by mouth every 4 (four) hours as  needed for severe pain.     rosuvastatin 20 MG tablet  Commonly known as:  CRESTOR  Take 20 mg by mouth at bedtime.     spironolactone-hydrochlorothiazide 25-25 MG per tablet  Commonly known as:  ALDACTAZIDE  Take 1 tablet by mouth daily as needed (for foot and leg swelling-patient reported).     VITAMIN D-3 PO  Take 1 capsule by mouth daily.           Follow-up Information   Follow up with Maia Petties., MD In 1 week. (For wound re-check and staple removal)    Specialty:  General Surgery   Contact information:   [address] [place] [address] [phone]       Signed: [name]. Laurielle Selmon 01/24/2014, 11:21 AM

## 2014-02-02 ENCOUNTER — Ambulatory Visit (INDEPENDENT_AMBULATORY_CARE_PROVIDER_SITE_OTHER): Payer: Medicare Other

## 2014-02-02 ENCOUNTER — Encounter (INDEPENDENT_AMBULATORY_CARE_PROVIDER_SITE_OTHER): Payer: Medicare Other

## 2014-02-02 DIAGNOSIS — Z4802 Encounter for removal of sutures: Secondary | ICD-10-CM

## 2014-02-02 NOTE — Progress Notes (Signed)
Patient comes in 13 days s/p open ventral hernia repair with mesh. Incision is intact and healed well. All staples were removed without difficulty and steri strips applied. Patient will follow up with Dr [name] next week.

## 2014-02-07 ENCOUNTER — Encounter (INDEPENDENT_AMBULATORY_CARE_PROVIDER_SITE_OTHER): Payer: Self-pay | Admitting: Surgery

## 2014-02-07 ENCOUNTER — Ambulatory Visit (INDEPENDENT_AMBULATORY_CARE_PROVIDER_SITE_OTHER): Payer: Medicare Other | Admitting: Surgery

## 2014-02-07 VITALS — BP 127/86 | HR 68 | Temp 97.0°F | Resp 18 | Ht 71.0 in | Wt 241.4 lb

## 2014-02-07 DIAGNOSIS — K432 Incisional hernia without obstruction or gangrene: Secondary | ICD-10-CM

## 2014-02-07 MED ORDER — OXYCODONE-ACETAMINOPHEN 5-325 MG PO TABS: 1.0000 | ORAL_TABLET | ORAL | Status: DC | PRN

## 2014-02-07 NOTE — Progress Notes (Signed)
Status post open ventral hernia repair with mesh on 01/20/14. We had to excise his old mesh and we repaired this with onlay mesh. His drain was removed prior to discharge. The patient was sent home with Percocet as well as an abdominal binder. His staples were moved last week. He has developed a seroma in the subcutaneous space. The incision looks good with no sign of infection. He is not wearing his abdominal binder.  We prepped the skin with alcohol and anesthetized with 1% lidocaine. We aspirated 100 cc of serosanguineous fluid. I encouraged him to wear his abdominal binder as much as possible. We gave a refill of the pain medication. Recheck in 2 weeks.  Limited activity with no heavy lifting.  [name]. [name], MD, [facility]  General/ Trauma Surgery  02/07/2014 10:37 AM

## 2014-02-21 ENCOUNTER — Encounter (INDEPENDENT_AMBULATORY_CARE_PROVIDER_SITE_OTHER): Payer: Self-pay | Admitting: Surgery

## 2014-02-21 ENCOUNTER — Ambulatory Visit (INDEPENDENT_AMBULATORY_CARE_PROVIDER_SITE_OTHER): Payer: Medicare Other | Admitting: Surgery

## 2014-02-21 DIAGNOSIS — IMO0002 Reserved for concepts with insufficient information to code with codable children: Secondary | ICD-10-CM

## 2014-02-21 DIAGNOSIS — K432 Incisional hernia without obstruction or gangrene: Secondary | ICD-10-CM

## 2014-02-21 MED ORDER — OXYCODONE-ACETAMINOPHEN 5-325 MG PO TABS: 1.0000 | ORAL_TABLET | ORAL | Status: DC | PRN

## 2014-02-21 NOTE — Progress Notes (Signed)
Status post open ventral hernia repair with mesh on 01/20/14. We had to excise his old mesh and we repaired this with onlay mesh. His drain was removed prior to discharge. The patient was sent home with Percocet as well as an abdominal binder. His staples were moved last week. He has developed a seroma in the subcutaneous space. The incision looks good with no sign of infection. He is wearing his abdominal binder. On 02/07/14, we aspirated 100 ml of serosanguinous fluid.  The seroma has recurred.  We prepped the skin with alcohol and anesthetized with 1% lidocaine. We aspirated 300 cc of serosanguineous fluid. I encouraged him to wear his abdominal binder as much as possible. We gave a refill of the pain medication. Recheck in 1 week.    Limited activity with no heavy lifting.    [name]. [name], MD, [facility]  General/ Trauma Surgery

## 2014-02-28 ENCOUNTER — Encounter (INDEPENDENT_AMBULATORY_CARE_PROVIDER_SITE_OTHER): Payer: Self-pay | Admitting: Surgery

## 2014-02-28 ENCOUNTER — Ambulatory Visit (INDEPENDENT_AMBULATORY_CARE_PROVIDER_SITE_OTHER): Payer: Medicare Other | Admitting: Surgery

## 2014-02-28 VITALS — BP 122/78 | HR 92 | Temp 98.3°F | Ht 71.0 in | Wt 237.0 lb

## 2014-02-28 DIAGNOSIS — T888XXD Other specified complications of surgical and medical care, not elsewhere classified, subsequent encounter: Secondary | ICD-10-CM

## 2014-02-28 DIAGNOSIS — Z5189 Encounter for other specified aftercare: Secondary | ICD-10-CM

## 2014-02-28 DIAGNOSIS — IMO0001 Reserved for inherently not codable concepts without codable children: Secondary | ICD-10-CM

## 2014-02-28 DIAGNOSIS — IMO0002 Reserved for concepts with insufficient information to code with codable children: Secondary | ICD-10-CM

## 2014-02-28 DIAGNOSIS — T792XXD Traumatic secondary and recurrent hemorrhage and seroma, subsequent encounter: Secondary | ICD-10-CM

## 2014-02-28 DIAGNOSIS — K432 Incisional hernia without obstruction or gangrene: Secondary | ICD-10-CM

## 2014-02-28 NOTE — Progress Notes (Signed)
Status post open ventral hernia repair with mesh on 01/20/14. We had to excise his old mesh and we repaired this with onlay mesh. His drain was removed prior to discharge. The patient was sent home with Percocet as well as an abdominal binder. His staples were moved last week. He has developed a seroma in the subcutaneous space. The incision looks good with no sign of infection. He is wearing his abdominal binder. On 02/07/14, we aspirated 100 ml of serosanguinous fluid. On 6/15, we aspirated 300 ml of serosanguinous fluid.  He has had some recurrence of the seroma, but it doesn't seem as large as last week.  He still is not wearing his abdominal binder.  We prepped the skin with alcohol and anesthetized with 1% lidocaine. We aspirated 60 cc of serosanguineous fluid.   Follow-up 1 week.  [name]. [name], MD, [facility]  General/ Trauma Surgery  02/28/2014 11:42 AM

## 2014-03-09 ENCOUNTER — Ambulatory Visit (INDEPENDENT_AMBULATORY_CARE_PROVIDER_SITE_OTHER): Payer: Medicare Other | Admitting: Surgery

## 2014-03-09 ENCOUNTER — Encounter (INDEPENDENT_AMBULATORY_CARE_PROVIDER_SITE_OTHER): Payer: Self-pay | Admitting: Surgery

## 2014-03-09 VITALS — BP 129/78 | HR 74 | Temp 97.4°F | Resp 16 | Ht 71.0 in | Wt 240.2 lb

## 2014-03-09 DIAGNOSIS — IMO0002 Reserved for concepts with insufficient information to code with codable children: Secondary | ICD-10-CM

## 2014-03-09 DIAGNOSIS — T792XXD Traumatic secondary and recurrent hemorrhage and seroma, subsequent encounter: Secondary | ICD-10-CM

## 2014-03-09 DIAGNOSIS — K432 Incisional hernia without obstruction or gangrene: Secondary | ICD-10-CM

## 2014-03-09 DIAGNOSIS — Z5189 Encounter for other specified aftercare: Secondary | ICD-10-CM

## 2014-03-09 DIAGNOSIS — T888XXD Other specified complications of surgical and medical care, not elsewhere classified, subsequent encounter: Secondary | ICD-10-CM

## 2014-03-09 DIAGNOSIS — IMO0001 Reserved for inherently not codable concepts without codable children: Secondary | ICD-10-CM

## 2014-03-09 NOTE — Progress Notes (Signed)
Status post open ventral her repair for recurrent hernia on 01/20/14. His incision is completely healed. The hernia repair seems to be intact. He still has a little the seroma. We drained 190 cc of fluid with complete decompression of the seroma. The patient will wear his abdominal binder as much possible. He will call back if he has any recurrence of the seroma. He may resume full activity.  [name]. [name], MD, [facility] Surgery  General/ Trauma Surgery  03/09/2014 10:54 AM

## 2014-03-19 ENCOUNTER — Other Ambulatory Visit: Payer: Self-pay | Admitting: *Deleted

## 2014-03-19 DIAGNOSIS — E785 Hyperlipidemia, unspecified: Secondary | ICD-10-CM

## 2014-03-19 DIAGNOSIS — E119 Type 2 diabetes mellitus without complications: Secondary | ICD-10-CM

## 2014-03-19 DIAGNOSIS — I10 Essential (primary) hypertension: Secondary | ICD-10-CM

## 2014-03-28 ENCOUNTER — Ambulatory Visit (INDEPENDENT_AMBULATORY_CARE_PROVIDER_SITE_OTHER): Payer: Medicare Other | Admitting: Family Medicine

## 2014-03-28 ENCOUNTER — Encounter: Payer: Self-pay | Admitting: Family Medicine

## 2014-03-28 VITALS — BP 110/72 | HR 78 | Temp 98.4°F | Resp 18 | Ht 71.0 in | Wt 238.0 lb

## 2014-03-28 DIAGNOSIS — IMO0001 Reserved for inherently not codable concepts without codable children: Secondary | ICD-10-CM | POA: Diagnosis not present

## 2014-03-28 DIAGNOSIS — E1165 Type 2 diabetes mellitus with hyperglycemia: Principal | ICD-10-CM

## 2014-03-28 LAB — COMPLETE METABOLIC PANEL WITH GFR
ALT: 21 U/L (ref 0–53)
AST: 24 U/L (ref 0–37)
Albumin: 4.3 g/dL (ref 3.5–5.2)
Alkaline Phosphatase: 60 U/L (ref 39–117)
BUN: 5 mg/dL — AB (ref 6–23)
CALCIUM: 9.5 mg/dL (ref 8.4–10.5)
CHLORIDE: 99 meq/L (ref 96–112)
CO2: 27 mEq/L (ref 19–32)
CREATININE: 0.59 mg/dL (ref 0.50–1.35)
GFR, Est Non African American: >89 mL/min
Glucose, Bld: 224 mg/dL — ABNORMAL HIGH (ref 70–99)
Potassium: 3.6 mEq/L (ref 3.5–5.3)
Sodium: 138 mEq/L (ref 135–145)
Total Bilirubin: 1 mg/dL (ref 0.2–1.2)
Total Protein: 7.2 g/dL (ref 6.0–8.3)

## 2014-03-28 LAB — LIPID PANEL
CHOLESTEROL: 94 mg/dL (ref 0–200)
HDL: 26 mg/dL — ABNORMAL LOW (ref >39)
LDL Cholesterol: 26 mg/dL (ref 0–99)
Total CHOL/HDL Ratio: 3.6 Ratio
Triglycerides: 211 mg/dL — ABNORMAL HIGH (ref <150)
VLDL: 42 mg/dL — AB (ref 0–40)

## 2014-03-28 LAB — HEMOGLOBIN A1C
Hgb A1c MFr Bld: 8.9 % — ABNORMAL HIGH (ref <5.7)
Mean Plasma Glucose: 209 mg/dL — ABNORMAL HIGH (ref <117)

## 2014-03-28 MED ORDER — PIOGLITAZONE HCL 30 MG PO TABS: 30.0000 mg | ORAL_TABLET | Freq: Every day | ORAL | Status: DC

## 2014-03-28 NOTE — Progress Notes (Signed)
Subjective:    Patient ID: [name], male    DOB: 12/18/1965, 48 y.o.   MRN: [id number]  HPI Patient has a history of diabetes mellitus type 2 is insulin-dependent and uncontrolled. He takes 40 units of Lantus twice daily. His fasting blood sugars are highly variable. They range between 150 to greater than 300. This depends on what the patient eats. He frequently skips meals. When he does eat it tends to be high carbohydrate high-calorie meals.   For instance, the only thing he had yesterday was pepsi.  This makes it extremely difficult to control his sugars. He is also not taking any kind of angiotensin receptor blocker or ACE inhibitor for renal protection. He is fasting this morning. He is overdue for hemoglobin A1c as well as a urine microalbumin and fasting lipid panel. His blood pressures well controlled at 110/72. Past Medical History  Diagnosis Date  . Cancer carcinoid  . Hypertension   . GERD (gastroesophageal reflux disease)   . Ventral hernia   . UTI (lower urinary tract infection)   . Colon cancer   . Shortness of breath     with exertion  . Hyperlipemia   . Kidney stones   . Type II diabetes mellitus    Past Surgical History  Procedure Laterality Date  . Cholecystectomy    . Abdominal hernia repair  01/20/2014    recurrent VHR w/mesh  . Kidney stone surgery  1999    "had to open me up after they busted; cut just above pubis"  . Colectomy  07/2008    "got all the cancer out"  . Hernia repair  02/2009; 01/20/2014    IHR; VHR  . Ventral hernia repair N/A 01/20/2014    Procedure: OPEN REPAIR OF A RECURRENT VENTRAL HERNIA REPAIR;  Surgeon: [name]. [name], MD;  Location: [facility];  Service: General;  Laterality: N/A;  . Insertion of mesh N/A 01/20/2014    Procedure: INSERTION OF MESH;  Surgeon: [name]. [name], MD;  Location: [facility];  Service: General;  Laterality: N/A;   Current Outpatient Prescriptions on File Prior to Visit  Medication Sig Dispense Refill  . ACCU-CHEK  AVIVA PLUS test strip USE AS DIRECTED THREE TIMES DAILY  100 each  3  . Cholecalciferol (VITAMIN D-3 PO) Take 1 capsule by mouth daily.      . famotidine (PEPCID) 20 MG tablet Take 20 mg by mouth 2 (two) times daily as needed for heartburn or indigestion.      . insulin glargine (LANTUS) 100 UNIT/ML injection Inject 40-42 Units into the skin 2 (two) times daily. 40 units in the morning and 42 units at bedtime.      . metFORMIN (GLUCOPHAGE) 1000 MG tablet Take 1,000 mg by mouth 2 (two) times daily with a meal.      . metoCLOPramide (REGLAN) 10 MG tablet Take 10 mg by mouth every morning.      [name] oxyCODONE-acetaminophen (PERCOCET/ROXICET) 5-325 MG per tablet Take 1 tablet by mouth every 4 (four) hours as needed for severe pain.  40 tablet  0  . rosuvastatin (CRESTOR) 20 MG tablet Take 20 mg by mouth at bedtime.      [name] spironolactone-hydrochlorothiazide (ALDACTAZIDE) 25-25 MG per tablet Take 1 tablet by mouth daily as needed (for foot and leg swelling-patient reported).       No current facility-administered medications on file prior to visit.   No Known Allergies History   Social History  . Marital Status:  Single    Spouse Name: N/A    Number of Children: N/A  . Years of Education: N/A   Occupational History  . Not on file.   Social History Main Topics  . Smoking status: Never Smoker   . Smokeless tobacco: Never Used  . Alcohol Use: No  . Drug Use: No  . Sexual Activity: Not Currently   Other Topics Concern  . Not on file   Social History Narrative  . No narrative on file     Review of Systems  All other systems reviewed and are negative.      Objective:   Physical Exam  Vitals reviewed. Constitutional: He appears well-developed and well-nourished.  Neck: Neck supple. No JVD present.  Cardiovascular: Normal rate, regular rhythm and normal heart sounds.   Pulmonary/Chest: Effort normal and breath sounds normal. No respiratory distress. He has no wheezes. He has no rales.   Abdominal: Soft. Bowel sounds are normal. He exhibits no distension. There is no tenderness. There is no rebound and no guarding.  Musculoskeletal: He exhibits no edema.  Lymphadenopathy:    He has no cervical adenopathy.          Assessment & Plan:  1. Type II or unspecified type diabetes mellitus without mention of complication, uncontrolled Sugars are not controlled. However due to his highly variable schedule and diet, I hesitate to put the patient on her insulin due to the risk of hypoglycemia. Therefore I placed the patient on Actos 30 mg by mouth daily as an insulin sensitizer and hopefully reduce the chance of hypoglycemia. We also had a discussion regarding consistency in his meals, not skipping meals, and eating a low carbohydrate diet. I'll also check a urine microalbumin. If elevated I will start the patient on Diovan. Check fasting lipid panel. Go LDL is less than 100. Also recommended aspirin 81 mg by mouth daily. I will schedule the patient for diabetic eye exam. - pioglitazone (ACTOS) 30 MG tablet; Take 1 tablet (30 mg total) by mouth daily.  Dispense: 30 tablet; Refill: 11 - Microalbumin, urine - COMPLETE METABOLIC PANEL WITH GFR - Lipid panel - Hemoglobin A1c

## 2014-03-29 LAB — MICROALBUMIN, URINE: Microalb, Ur: 1.48 mg/dL (ref 0.00–1.89)

## 2014-04-18 DIAGNOSIS — E119 Type 2 diabetes mellitus without complications: Secondary | ICD-10-CM | POA: Diagnosis not present

## 2014-05-03 ENCOUNTER — Encounter: Payer: Self-pay | Admitting: Family Medicine

## 2014-05-11 ENCOUNTER — Other Ambulatory Visit: Payer: Self-pay | Admitting: Family Medicine

## 2014-06-02 ENCOUNTER — Ambulatory Visit (HOSPITAL_BASED_OUTPATIENT_CLINIC_OR_DEPARTMENT_OTHER): Payer: Medicare Other | Admitting: Adult Health

## 2014-06-02 ENCOUNTER — Other Ambulatory Visit (HOSPITAL_BASED_OUTPATIENT_CLINIC_OR_DEPARTMENT_OTHER): Payer: Medicare Other

## 2014-06-02 ENCOUNTER — Encounter: Payer: Self-pay | Admitting: Adult Health

## 2014-06-02 ENCOUNTER — Telehealth: Payer: Self-pay | Admitting: Hematology and Oncology

## 2014-06-02 VITALS — BP 106/69 | HR 81 | Temp 97.8°F | Resp 18 | Ht 71.0 in | Wt 236.8 lb

## 2014-06-02 DIAGNOSIS — Z859 Personal history of malignant neoplasm, unspecified: Secondary | ICD-10-CM

## 2014-06-02 DIAGNOSIS — C7B8 Other secondary neuroendocrine tumors: Secondary | ICD-10-CM

## 2014-06-02 LAB — COMPREHENSIVE METABOLIC PANEL (CC13)
ALT: 22 U/L (ref 0–55)
ANION GAP: 10 meq/L (ref 3–11)
AST: 24 U/L (ref 5–34)
Albumin: 3.9 g/dL (ref 3.5–5.0)
Alkaline Phosphatase: 67 U/L (ref 40–150)
BUN: 6.1 mg/dL — AB (ref 7.0–26.0)
CO2: 27 meq/L (ref 22–29)
CREATININE: 0.8 mg/dL (ref 0.7–1.3)
Calcium: 9.7 mg/dL (ref 8.4–10.4)
Chloride: 103 mEq/L (ref 98–109)
GLUCOSE: 276 mg/dL — AB (ref 70–140)
Potassium: 4 mEq/L (ref 3.5–5.1)
Sodium: 139 mEq/L (ref 136–145)
Total Bilirubin: 1.15 mg/dL (ref 0.20–1.20)
Total Protein: 7.6 g/dL (ref 6.4–8.3)

## 2014-06-02 LAB — CBC WITH DIFFERENTIAL/PLATELET
BASO%: 0.9 % (ref 0.0–2.0)
Basophils Absolute: 0 10*3/uL (ref 0.0–0.1)
EOS ABS: 0.1 10*3/uL (ref 0.0–0.5)
EOS%: 3.6 % (ref 0.0–7.0)
HEMATOCRIT: 45.1 % (ref 38.4–49.9)
HGB: 15.5 g/dL (ref 13.0–17.1)
LYMPH%: 50.3 % — AB (ref 14.0–49.0)
MCH: 35 pg — ABNORMAL HIGH (ref 27.2–33.4)
MCHC: 34.3 g/dL (ref 32.0–36.0)
MCV: 102.1 fL — ABNORMAL HIGH (ref 79.3–98.0)
MONO#: 0.3 10*3/uL (ref 0.1–0.9)
MONO%: 6.5 % (ref 0.0–14.0)
NEUT%: 38.7 % — AB (ref 39.0–75.0)
NEUTROS ABS: 1.5 10*3/uL (ref 1.5–6.5)
PLATELETS: 171 10*3/uL (ref 140–400)
RBC: 4.42 10*6/uL (ref 4.20–5.82)
RDW: 14.6 % (ref 11.0–14.6)
WBC: 4 10*3/uL (ref 4.0–10.3)
lymph#: 2 10*3/uL (ref 0.9–3.3)

## 2014-06-02 NOTE — Telephone Encounter (Signed)
per pof to sch pt -sch & gave pt copy of sch

## 2014-06-02 NOTE — Progress Notes (Signed)
Hematology and Oncology Follow Up Visit  [name] [id number] 01-26-1966 48 y.o. 06/02/2014 10:58 AM  Principle Diagnosis: 48 year old male with h/o neuroendocrine carcinoid tumor of the ileum diagnosed in 07/2005.   Prior Therapy: 1. S/P ileum resection in 07/2005 for neuroendocrine carcinoid   2. Observation for the carcinoid.  Last CT of the A/P was on 11/08/2013 and was negative for metastatic disease to the abdomen and pelvis.     Current therapy: Observation  Interim History:  Patient is a 48 year old male with h/o neuroendocrine tumor who is here today for follow up of h/o neuroendocrine carcinoid tumor of the ileum.  He is doing well today.  He did have ventral hernia repair and has some mild abdominal soreness that has been present since then.   He denies any fevers, diarrhea, flushing, nausea, vomiting, or any further concerns.   Medications:  Current Outpatient Prescriptions  Medication Sig Dispense Refill  . ACCU-CHEK AVIVA PLUS test strip USE AS DIRECTED THREE TIMES DAILY  100 each  3  . Cholecalciferol (VITAMIN D-3 PO) Take 1 capsule by mouth daily.      . CRESTOR 20 MG tablet TAKE 1 TABLET BY MOUTH EVERY DAY  30 tablet  3  . CRESTOR 20 MG tablet TAKE 1 TABLET BY MOUTH EVERY DAY  30 tablet  3  . famotidine (PEPCID) 20 MG tablet Take 20 mg by mouth 2 (two) times daily as needed for heartburn or indigestion.      . insulin glargine (LANTUS) 100 UNIT/ML injection Inject 40-42 Units into the skin 2 (two) times daily. 40 units in the morning and 42 units at bedtime.      . metFORMIN (GLUCOPHAGE) 1000 MG tablet Take 1,000 mg by mouth 2 (two) times daily with a meal.      . metFORMIN (GLUCOPHAGE) 1000 MG tablet TAKE 1 TABLET BY MOUTH TWICE DAILY WITH A MEAL  60 tablet  3  . metoCLOPramide (REGLAN) 10 MG tablet Take 10 mg by mouth every morning.      [name] oxyCODONE-acetaminophen (PERCOCET/ROXICET) 5-325 MG per tablet Take 1 tablet by mouth every 4 (four) hours as needed for severe  pain.  40 tablet  0  . pioglitazone (ACTOS) 30 MG tablet Take 1 tablet (30 mg total) by mouth daily.  30 tablet  11  . rosuvastatin (CRESTOR) 20 MG tablet Take 20 mg by mouth at bedtime.       No current facility-administered medications for this visit.     Allergies: No Known Allergies  Past Medical History  Diagnosis Date  . Cancer carcinoid  . Hypertension   . GERD (gastroesophageal reflux disease)   . Ventral hernia   . UTI (lower urinary tract infection)   . Colon cancer   . Shortness of breath     with exertion  . Hyperlipemia   . Kidney stones   . Type II diabetes mellitus    Past Surgical History  Procedure Laterality Date  . Cholecystectomy    . Abdominal hernia repair  01/20/2014    recurrent VHR w/mesh  . Kidney stone surgery  1999    "had to open me up after they busted; cut just above pubis"  . Colectomy  07/2008    "got all the cancer out"  . Hernia repair  02/2009; 01/20/2014    IHR; VHR  . Ventral hernia repair N/A 01/20/2014    Procedure: OPEN REPAIR OF A RECURRENT VENTRAL HERNIA REPAIR;  Surgeon:  [name]. [name], MD;  Location: [facility];  Service: General;  Laterality: N/A;  . Insertion of mesh N/A 01/20/2014    Procedure: INSERTION OF MESH;  Surgeon: [name]. [name], MD;  Location: [facility];  Service: General;  Laterality: N/A;   Family History  Problem Relation Age of Onset  . Cancer Mother 13    breast cancer  . Breast cancer Mother   . Diabetes Mother   . Diabetes Sister   . Emphysema Father     Review of Systems: A 10 point review of systems was conducted and is otherwise negative except for what is noted above.     Physical Exam: Blood pressure 106/69, pulse 81, temperature 97.8 F (36.6 C), temperature source Oral, resp. rate 18, height 5\' 11"  (1.803 m), weight 236 lb 12.8 oz (107.412 kg). GENERAL: Patient is a well appearing male in no acute distress, poor hygeine HEENT:  Sclerae anicteric.  Oropharynx clear and moist. No ulcerations or  evidence of oropharyngeal candidiasis. Neck is supple.  NODES:  No cervical, supraclavicular, or axillary lymphadenopathy palpated.  LUNGS:  Clear to auscultation bilaterally.  No wheezes or rhonchi. HEART:  Regular rate and rhythm. No murmur appreciated. ABDOMEN:  Soft, mostly nontender.  Positive, normoactive bowel sounds. No organomegaly palpated. + ventral hernia scar repar, slight tenderness in left upper quad near incision MSK:  No focal spinal tenderness to palpation. Full range of motion bilaterally in the upper extremities. EXTREMITIES:  No peripheral edema.   SKIN:  Clear with no obvious rashes or skin changes. No nail dyscrasia. NEURO:  Nonfocal. Well oriented.  Appropriate affect. ECOG: 1      Lab Results: Lab Results  Component Value Date   WBC 4.0 06/02/2014   HGB 15.5 06/02/2014   HCT 45.1 06/02/2014   MCV 102.1* 06/02/2014   PLT 171 06/02/2014     Chemistry      Component Value Date/Time   NA 139 06/02/2014 0953   NA 138 03/28/2014 0817   NA 131 10/26/2009 1008   K 4.0 06/02/2014 0953   K 3.6 03/28/2014 0817   K 3.9 10/26/2009 1008   CL 99 03/28/2014 0817   CL 100 11/02/2012 0955   CL 100 10/26/2009 1008   CO2 27 06/02/2014 0953   CO2 27 03/28/2014 0817   CO2 28 10/26/2009 1008   BUN 6.1* 06/02/2014 0953   BUN 5* 03/28/2014 0817   BUN 10 10/26/2009 1008   CREATININE 0.8 06/02/2014 0953   CREATININE 0.59 03/28/2014 0817   CREATININE 0.62 01/22/2014 0530      Component Value Date/Time   CALCIUM 9.7 06/02/2014 0953   CALCIUM 9.5 03/28/2014 0817   CALCIUM 9.0 10/26/2009 1008   ALKPHOS 67 06/02/2014 0953   ALKPHOS 60 03/28/2014 0817   ALKPHOS 86* 10/26/2009 1008   AST 24 06/02/2014 0953   AST 24 03/28/2014 0817   AST 32 10/26/2009 1008   ALT 22 06/02/2014 0953   ALT 21 03/28/2014 0817   ALT 43 10/26/2009 1008   BILITOT 1.15 06/02/2014 0953   BILITOT 1.0 03/28/2014 0817   BILITOT 0.80 10/26/2009 1008       Radiological Studies: CLINICAL DATA: History of carcinoid tumor of the  colon. Status post  partial colectomy. Cholecystectomy. Worsening abdominal pain.  Diabetes.  EXAM:  CT ABDOMEN AND PELVIS WITH CONTRAST  TECHNIQUE:  Multidetector CT imaging of the abdomen and pelvis was performed  using the standard protocol following bolus administration of  intravenous contrast.  CONTRAST: 111mL OMNIPAQUE IOHEXOL 300 MG/ML SOLN  COMPARISON: NM GASTRIC EMPTYING dated 04/28/2012; CT ABD/PELVIS W CM  dated 02/18/2012  FINDINGS:  Lower Chest: Clear lung bases. Normal heart size without pericardial  or pleural effusion.  Abdomen/Pelvis: Arterial phase images demonstrate no hypervascular  lesions within the liver, pancreas, or kidneys.  Portal venous phase images demonstrate moderate hepatic steatosis.  Hepatomegaly, greater than 20 cm craniocaudal. Mild prominence of  the caudate lobe, without specific evidence of cirrhosis.  Normal spleen, stomach, pancreas. Cholecystectomy without biliary  ductal dilatation. Normal adrenal glands and right kidney. A lower  pole left renal cyst measures 1.7 cm. Mild but age advanced aortic  atherosclerosis. Porta hepatis adenopathy. A portal caval node  measures 1.9 x 3.5 cm on image 58. This is similar to on the prior  exam.  Surgical changes of right hemicolectomy. Normal appearance of small  bowel loops, without ascites. No evidence of omental or peritoneal  disease. No mesenteric adenopathy.  No pelvic adenopathy. Normal urinary bladder and prostate. No  significant free fluid.  Prior ventral abdominal wall hernia repair.  Bones/Musculoskeletal: Congenitally short lumbar pedicles. This  contributes to borderline central canal stenosis throughout the  lumbar spine.  IMPRESSION:  1. No acute process or evidence of metastatic disease in the abdomen  or pelvis.  2. Hepatic steatosis and hepatomegaly. Chronic mild porta hepatis  adenopathy, favored to be reactive and related to steatosis.  Electronically Signed  By: Abigail Miyamoto  [name].  On: 11/08/2013 08:19    Assessment and Plan:  Patient is a 48 year old male with  1.  Neuroendocrine carcinoid tumor of the ileium that was diagnosed in 2006.  He is s/p resection and has no signs of recurrence. His current lab work is stable and I reviewed his results with him and his mother in detail.  Recent CT scan in March, 2015 demonstrates no metastasis or recurrence of cancer. I reviewed the patient with Dr. Lindi Adie who also saw the patient.  Benz will return annually with CBC, CMP and chromogranin A.  The patient is in agreement with the plan.    The patient will return in 1 year  for labs and evaluation.    I spent 25 minutes counseling the patient face to face.  The total time spent in the appointment was 30 minutes.  Minette Headland, [place] [phone] 9/24/201510:58 AM  Attending Note  I personally saw and examined TOBY BREITHAUPT. The plan of care was discussed with him. I agree with the assessment and plan as documented above. I do not recommend CT scans routinely. If he has any GI symptoms that can be evaluated with scans as needed. Instead we will obtain chromogranin A levels once a year and followup. Signed Rulon Eisenmenger, MD

## 2014-07-12 ENCOUNTER — Other Ambulatory Visit: Payer: Self-pay | Admitting: Family Medicine

## 2014-07-12 NOTE — Telephone Encounter (Signed)
Med list says Reglan once daily.  Refill request says 4 x day.  Which is correct??

## 2014-09-10 ENCOUNTER — Other Ambulatory Visit: Payer: Self-pay | Admitting: Family Medicine

## 2014-09-23 ENCOUNTER — Telehealth: Payer: Self-pay | Admitting: Hematology and Oncology

## 2014-09-23 NOTE — Telephone Encounter (Signed)
Confirm appt for Sept. Mailed cal.

## 2014-09-30 ENCOUNTER — Ambulatory Visit: Payer: Medicare Other | Admitting: Family Medicine

## 2014-10-04 ENCOUNTER — Ambulatory Visit: Payer: Medicare Other | Admitting: Family Medicine

## 2014-10-10 ENCOUNTER — Ambulatory Visit: Payer: Medicare Other | Admitting: Family Medicine

## 2014-10-11 ENCOUNTER — Encounter: Payer: Self-pay | Admitting: Family Medicine

## 2014-10-13 ENCOUNTER — Encounter: Payer: Self-pay | Admitting: Family Medicine

## 2014-10-13 ENCOUNTER — Ambulatory Visit (INDEPENDENT_AMBULATORY_CARE_PROVIDER_SITE_OTHER): Payer: Commercial Managed Care - HMO | Admitting: Family Medicine

## 2014-10-13 VITALS — BP 110/76 | HR 84 | Temp 98.2°F | Resp 18 | Ht 71.0 in | Wt 244.0 lb

## 2014-10-13 DIAGNOSIS — E1165 Type 2 diabetes mellitus with hyperglycemia: Principal | ICD-10-CM

## 2014-10-13 DIAGNOSIS — E1065 Type 1 diabetes mellitus with hyperglycemia: Secondary | ICD-10-CM

## 2014-10-13 DIAGNOSIS — Z794 Long term (current) use of insulin: Principal | ICD-10-CM

## 2014-10-13 DIAGNOSIS — IMO0001 Reserved for inherently not codable concepts without codable children: Secondary | ICD-10-CM

## 2014-10-13 LAB — COMPLETE METABOLIC PANEL WITH GFR
ALT: 30 U/L (ref 0–53)
AST: 21 U/L (ref 0–37)
Albumin: 4 g/dL (ref 3.5–5.2)
Alkaline Phosphatase: 69 U/L (ref 39–117)
BUN: 4 mg/dL — AB (ref 6–23)
CO2: 25 mEq/L (ref 19–32)
Calcium: 9.6 mg/dL (ref 8.4–10.5)
Chloride: 98 mEq/L (ref 96–112)
Creat: 0.65 mg/dL (ref 0.50–1.35)
GFR, Est African American: >89 mL/min
GFR, Est Non African American: >89 mL/min
Glucose, Bld: 290 mg/dL — ABNORMAL HIGH (ref 70–99)
Potassium: 3.7 mEq/L (ref 3.5–5.3)
Sodium: 135 mEq/L (ref 135–145)
TOTAL PROTEIN: 7.1 g/dL (ref 6.0–8.3)
Total Bilirubin: 0.7 mg/dL (ref 0.2–1.2)

## 2014-10-13 LAB — HEMOGLOBIN A1C
HEMOGLOBIN A1C: 9.5 % — AB (ref <5.7)
Mean Plasma Glucose: 226 mg/dL — ABNORMAL HIGH (ref <117)

## 2014-10-13 MED ORDER — METOCLOPRAMIDE HCL 10 MG PO TABS: 10.0000 mg | ORAL_TABLET | Freq: Four times a day (QID) | ORAL | Status: DC

## 2014-10-13 MED ORDER — FAMOTIDINE 20 MG PO TABS: 20.0000 mg | ORAL_TABLET | Freq: Two times a day (BID) | ORAL | Status: DC | PRN

## 2014-10-13 NOTE — Progress Notes (Signed)
Subjective:    Patient ID: [name], male    DOB: 11-Jun-1966, 49 y.o.   MRN: [id number]  HPI   patient has a history of poorly controlled insulin-dependent diabetes mellitus complicated by noncompliance. He is here today for follow-up. Unfortunately he is not fasting and therefore I cannot check his cholesterol. He states that his fasting blood sugars are typically 190-200. He is not checking 2 hour postprandial sugars. He is supposed to be on Lantus twice daily but he is not taking his evening dose of Lantus out of apathy and because he forgets.   He also reports nausea and vomiting and stomach distention. He has a history of gastroparesis. He is no longer taking his acid reflux medication or his Reglan simply because he ran out. On examination today he has diminished bowel sounds. There is no tenderness or rebound or guarding on examination. There is no distention or evidence of a bowel obstruction. I believe he is suffering from gastroparesis. Past Medical History  Diagnosis Date  . Cancer carcinoid  . Hypertension   . GERD (gastroesophageal reflux disease)   . Ventral hernia   . UTI (lower urinary tract infection)   . Colon cancer   . Shortness of breath     with exertion  . Hyperlipemia   . Kidney stones   . Type II diabetes mellitus    Past Surgical History  Procedure Laterality Date  . Cholecystectomy    . Abdominal hernia repair  01/20/2014    recurrent VHR w/mesh  . Kidney stone surgery  1999    "had to open me up after they busted; cut just above pubis"  . Colectomy  07/2008    "got all the cancer out"  . Hernia repair  02/2009; 01/20/2014    IHR; VHR  . Ventral hernia repair N/A 01/20/2014    Procedure: OPEN REPAIR OF A RECURRENT VENTRAL HERNIA REPAIR;  Surgeon: [name]. [name], MD;  Location: [facility];  Service: General;  Laterality: N/A;  . Insertion of mesh N/A 01/20/2014    Procedure: INSERTION OF MESH;  Surgeon: [name]. [name], MD;  Location: [facility];  Service:  General;  Laterality: N/A;   Current Outpatient Prescriptions on File Prior to Visit  Medication Sig Dispense Refill  . ACCU-CHEK AVIVA PLUS test strip USE AS DIRECTED THREE TIMES DAILY 100 each 3  . Cholecalciferol (VITAMIN D-3 PO) Take 1 capsule by mouth daily.    . insulin glargine (LANTUS) 100 UNIT/ML injection Inject 30 Units into the skin daily. 40 units in the morning and 42 units at bedtime.    . metFORMIN (GLUCOPHAGE) 1000 MG tablet Take 1,000 mg by mouth 2 (two) times daily with a meal.    . oxyCODONE-acetaminophen (PERCOCET/ROXICET) 5-325 MG per tablet Take 1 tablet by mouth every 4 (four) hours as needed for severe pain. 40 tablet 0  . pioglitazone (ACTOS) 30 MG tablet Take 1 tablet (30 mg total) by mouth daily. 30 tablet 11  . rosuvastatin (CRESTOR) 20 MG tablet Take 20 mg by mouth at bedtime.     No current facility-administered medications on file prior to visit.   No Known Allergies History   Social History  . Marital Status: Single    Spouse Name: N/A    Number of Children: N/A  . Years of Education: N/A   Occupational History  . Not on file.   Social History Main Topics  . Smoking status: Never Smoker   . Smokeless tobacco:  Never Used  . Alcohol Use: No  . Drug Use: No  . Sexual Activity: Not Currently   Other Topics Concern  . Not on file   Social History Narrative     Review of Systems  All other systems reviewed and are negative.      Objective:   Physical Exam  Neck: Neck supple.  Cardiovascular: Normal rate, regular rhythm and normal heart sounds.   Pulmonary/Chest: Effort normal and breath sounds normal. No respiratory distress. He has no wheezes. He has no rales.  Abdominal: Soft. He exhibits no distension and no mass. Bowel sounds are decreased. There is no tenderness. There is no rebound and no guarding.  Vitals reviewed.         Assessment & Plan:  Diabetes mellitus, insulin dependent (IDDM), uncontrolled - Plan: COMPLETE  METABOLIC PANEL WITH GFR, Hemoglobin A1c, Microalbumin, urine   Patient has diabetes mellitus which is poorly controlled due to his noncompliance. I will check a hemoglobin A1c. I suspect he'll be out of control. Depending on the value I will likely increase his Lantus from 40 units in the morning 260-80 units in the morning depending on the magnitude of his hyperglycemia. The problem , however, is that the patient is not checking his sugars and therefore I worry about hypoglycemia  If I increase his insulin to aggressively.   I have asked the patient to resume his Pepcid and his Reglan and I anticipate that his nausea and vomiting will improve after that. If not I would proceed with imaging of the abdomen given his history of cancer to rule out bowel obstruction.

## 2014-10-14 LAB — MICROALBUMIN, URINE: Microalb, Ur: 0.3 mg/dL (ref <2.0)

## 2014-11-03 ENCOUNTER — Telehealth: Payer: Self-pay | Admitting: Family Medicine

## 2014-11-03 ENCOUNTER — Other Ambulatory Visit: Payer: Self-pay | Admitting: *Deleted

## 2014-11-03 MED ORDER — INSULIN GLARGINE 100 UNIT/ML ~~LOC~~ SOLN: 60.0000 [IU] | Freq: Every day | SUBCUTANEOUS | Status: DC

## 2014-11-03 NOTE — Telephone Encounter (Signed)
Spoke to pt's mother as pt was not home and she stated that he had checked his bs and wanted to tell us what it was. i explained to her that he needed to check his FBS and 2 pp x 2 weeks and call us back with all the numbers. She verbalized understanding.

## 2014-11-03 NOTE — Telephone Encounter (Signed)
Patient is calling to speak to you about his sugar being high please call him at [phone]

## 2014-11-04 ENCOUNTER — Other Ambulatory Visit: Payer: Self-pay | Admitting: Family Medicine

## 2015-01-19 ENCOUNTER — Encounter: Payer: Self-pay | Admitting: Family Medicine

## 2015-01-19 ENCOUNTER — Ambulatory Visit (INDEPENDENT_AMBULATORY_CARE_PROVIDER_SITE_OTHER): Payer: Commercial Managed Care - HMO | Admitting: Family Medicine

## 2015-01-19 VITALS — BP 100/70 | HR 78 | Temp 99.3°F | Resp 18 | Ht 71.0 in | Wt 242.0 lb

## 2015-01-19 DIAGNOSIS — J069 Acute upper respiratory infection, unspecified: Secondary | ICD-10-CM

## 2015-01-19 MED ORDER — HYDROCODONE-HOMATROPINE 5-1.5 MG/5ML PO SYRP: 5.0000 mL | ORAL_SOLUTION | Freq: Three times a day (TID) | ORAL | Status: DC | PRN

## 2015-01-19 MED ORDER — PROMETHAZINE HCL 25 MG PO TABS: 25.0000 mg | ORAL_TABLET | Freq: Three times a day (TID) | ORAL | Status: DC | PRN

## 2015-01-19 NOTE — Progress Notes (Signed)
Subjective:    Patient ID: [name], male    DOB: 28-Jan-1966, 49 y.o.   MRN: [id number]  HPI  Patient symptoms began 2 days ago. Symptoms consist of a fever to 101, a nonproductive cough, runny nose, sore throat. He also reports some nausea. He is unable to keep down substantial food. He is basically just been drinking liquids the last few days. He denies any diarrhea. He denies any abdominal pain. Denies any shortness of breath or chest pain or purulent sputum Past Medical History  Diagnosis Date  . Cancer carcinoid  . Hypertension   . GERD (gastroesophageal reflux disease)   . Ventral hernia   . UTI (lower urinary tract infection)   . Colon cancer   . Shortness of breath     with exertion  . Hyperlipemia   . Kidney stones   . Type II diabetes mellitus    Past Surgical History  Procedure Laterality Date  . Cholecystectomy    . Abdominal hernia repair  01/20/2014    recurrent VHR w/mesh  . Kidney stone surgery  1999    "had to open me up after they busted; cut just above pubis"  . Colectomy  07/2008    "got all the cancer out"  . Hernia repair  02/2009; 01/20/2014    IHR; VHR  . Ventral hernia repair N/A 01/20/2014    Procedure: OPEN REPAIR OF A RECURRENT VENTRAL HERNIA REPAIR;  Surgeon: [name]. [name], MD;  Location: [facility];  Service: General;  Laterality: N/A;  . Insertion of mesh N/A 01/20/2014    Procedure: INSERTION OF MESH;  Surgeon: [name]. [name], MD;  Location: [facility];  Service: General;  Laterality: N/A;   Current Outpatient Prescriptions on File Prior to Visit  Medication Sig Dispense Refill  . ACCU-CHEK AVIVA PLUS test strip USE AS DIRECTED THREE TIMES DAILY 100 each 3  . Cholecalciferol (VITAMIN D-3 PO) Take 1 capsule by mouth daily.    . famotidine (PEPCID) 20 MG tablet Take 1 tablet (20 mg total) by mouth 2 (two) times daily as needed for heartburn or indigestion. 60 tablet 4  . Insulin Glargine (LANTUS SOLOSTAR) 100 UNIT/ML Solostar Pen Inject 60 Units  into the skin daily. 15 mL 3  . metFORMIN (GLUCOPHAGE) 1000 MG tablet Take 1,000 mg by mouth 2 (two) times daily with a meal.    . metoCLOPramide (REGLAN) 10 MG tablet Take 1 tablet (10 mg total) by mouth 4 (four) times daily. 120 tablet 4  . pioglitazone (ACTOS) 30 MG tablet Take 1 tablet (30 mg total) by mouth daily. 30 tablet 11  . rosuvastatin (CRESTOR) 20 MG tablet Take 20 mg by mouth at bedtime.     No current facility-administered medications on file prior to visit.   No Known Allergies History   Social History  . Marital Status: Single    Spouse Name: N/A  . Number of Children: N/A  . Years of Education: N/A   Occupational History  . Not on file.   Social History Main Topics  . Smoking status: Never Smoker   . Smokeless tobacco: Never Used  . Alcohol Use: No  . Drug Use: No  . Sexual Activity: Not Currently   Other Topics Concern  . Not on file   Social History Narrative     Review of Systems  All other systems reviewed and are negative.      Objective:   Physical Exam  Constitutional: He appears well-developed and  well-nourished.  HENT:  Right Ear: External ear normal.  Left Ear: External ear normal.  Nose: Nose normal.  Mouth/Throat: Oropharynx is clear and moist. No oropharyngeal exudate.  Eyes: Conjunctivae are normal. Pupils are equal, round, and reactive to light.  Neck: Neck supple.  Cardiovascular: Normal rate, regular rhythm and normal heart sounds.   No murmur heard. Pulmonary/Chest: Breath sounds normal. No respiratory distress. He has no wheezes. He has no rales. He exhibits no tenderness.  Lymphadenopathy:    He has no cervical adenopathy.  Vitals reviewed.         Assessment & Plan:  Acute URI - Plan: HYDROcodone-homatropine (HYCODAN) 5-1.5 MG/5ML syrup, promethazine (PHENERGAN) 25 MG tablet  Patient's exam is normal today. I anticipate that the patient has a viral upper respiratory infection. I recommended supportive care  including Tylenol for fever and body aches, pushing fluids, Phenergan 25 mg every 8 hours as needed for nausea or vomiting, and Hycodan 1 teaspoon every 8 hours as needed for coughing. If symptoms worsen, if he develops shortness of breath, if he develops purulent sputum, he is to call me immediately. Otherwise I anticipate symptoms should gradually improve over the next 4-5 days.

## 2015-01-23 ENCOUNTER — Encounter: Payer: Self-pay | Admitting: Family Medicine

## 2015-03-02 ENCOUNTER — Encounter: Payer: Self-pay | Admitting: Family Medicine

## 2015-03-02 ENCOUNTER — Ambulatory Visit (INDEPENDENT_AMBULATORY_CARE_PROVIDER_SITE_OTHER): Payer: Commercial Managed Care - HMO | Admitting: Family Medicine

## 2015-03-02 VITALS — BP 112/72 | HR 70 | Temp 98.0°F | Resp 16 | Ht 69.0 in | Wt 234.0 lb

## 2015-03-02 DIAGNOSIS — Z23 Encounter for immunization: Secondary | ICD-10-CM | POA: Diagnosis not present

## 2015-03-02 DIAGNOSIS — E1165 Type 2 diabetes mellitus with hyperglycemia: Secondary | ICD-10-CM

## 2015-03-02 DIAGNOSIS — Z0000 Encounter for general adult medical examination without abnormal findings: Secondary | ICD-10-CM | POA: Diagnosis not present

## 2015-03-02 DIAGNOSIS — IMO0002 Reserved for concepts with insufficient information to code with codable children: Secondary | ICD-10-CM

## 2015-03-02 LAB — CBC WITH DIFFERENTIAL/PLATELET
BASOS PCT: 0 % (ref 0–1)
Basophils Absolute: 0 10*3/uL (ref 0.0–0.1)
EOS ABS: 0.1 10*3/uL (ref 0.0–0.7)
Eosinophils Relative: 2 % (ref 0–5)
HEMATOCRIT: 42.9 % (ref 39.0–52.0)
Hemoglobin: 14.9 g/dL (ref 13.0–17.0)
Lymphocytes Relative: 48 % — ABNORMAL HIGH (ref 12–46)
Lymphs Abs: 2 10*3/uL (ref 0.7–4.0)
MCH: 35.1 pg — ABNORMAL HIGH (ref 26.0–34.0)
MCHC: 34.7 g/dL (ref 30.0–36.0)
MCV: 101.2 fL — ABNORMAL HIGH (ref 78.0–100.0)
MONOS PCT: 8 % (ref 3–12)
MPV: 10.9 fL (ref 8.6–12.4)
Monocytes Absolute: 0.3 10*3/uL (ref 0.1–1.0)
NEUTROS PCT: 42 % — AB (ref 43–77)
Neutro Abs: 1.7 10*3/uL (ref 1.7–7.7)
PLATELETS: 207 10*3/uL (ref 150–400)
RBC: 4.24 MIL/uL (ref 4.22–5.81)
RDW: 14.6 % (ref 11.5–15.5)
WBC: 4.1 10*3/uL (ref 4.0–10.5)

## 2015-03-02 LAB — COMPLETE METABOLIC PANEL WITH GFR
ALT: 15 U/L (ref 0–53)
AST: 18 U/L (ref 0–37)
Albumin: 3.9 g/dL (ref 3.5–5.2)
Alkaline Phosphatase: 55 U/L (ref 39–117)
BUN: 5 mg/dL — ABNORMAL LOW (ref 6–23)
CALCIUM: 9.3 mg/dL (ref 8.4–10.5)
CHLORIDE: 101 meq/L (ref 96–112)
CO2: 27 meq/L (ref 19–32)
Creat: 0.66 mg/dL (ref 0.50–1.35)
GFR, Est African American: >89 mL/min
GFR, Est Non African American: >89 mL/min
Glucose, Bld: 167 mg/dL — ABNORMAL HIGH (ref 70–99)
Potassium: 3.8 mEq/L (ref 3.5–5.3)
SODIUM: 143 meq/L (ref 135–145)
TOTAL PROTEIN: 7.2 g/dL (ref 6.0–8.3)
Total Bilirubin: 0.7 mg/dL (ref 0.2–1.2)

## 2015-03-02 LAB — LIPID PANEL
Cholesterol: 83 mg/dL (ref 0–200)
HDL: 26 mg/dL — AB (ref >=40)
LDL Cholesterol: 31 mg/dL (ref 0–99)
TRIGLYCERIDES: 129 mg/dL (ref <150)
Total CHOL/HDL Ratio: 3.2 Ratio
VLDL: 26 mg/dL (ref 0–40)

## 2015-03-02 NOTE — Addendum Note (Signed)
Addended by: Shary Decamp B on: 03/02/2015 08:28 AM   Modules accepted: Orders

## 2015-03-02 NOTE — Progress Notes (Signed)
Subjective:    Patient ID: [name], male    DOB: Sep 24, 1965, 49 y.o.   MRN: [id number]  HPI  Patient is a 49 year old white male who has a history of type 2 diabetes mellitus poorly controlled secondary to noncompliance. Last hemoglobin A1c was 9.5 in February. At that time I recommended increasing Lantus to 60 units a day. Patient did that one day and then quit the change in his insulin because he states his left arm went numb. He has not been checking his sugars but when he does it is usually over 200. He denies any blurred vision, polyuria, polydipsia, or numbness and tingling in the feet. He is not taking a baby aspirin. He is overdue for diabetic eye exam. He is overdue for a pneumonia vaccine. Patient's last colonoscopy was in 2013. He was given a 10 year clearance and is not due again until 2023. He has no family history of prostate cancer and therefore is not yet due for a PSA. Blood pressure today is well controlled. Past Medical History  Diagnosis Date  . Cancer carcinoid  . Hypertension   . GERD (gastroesophageal reflux disease)   . Ventral hernia   . UTI (lower urinary tract infection)   . Colon cancer   . Shortness of breath     with exertion  . Hyperlipemia   . Kidney stones   . Type II diabetes mellitus    Past Surgical History  Procedure Laterality Date  . Cholecystectomy    . Abdominal hernia repair  01/20/2014    recurrent VHR w/mesh  . Kidney stone surgery  1999    "had to open me up after they busted; cut just above pubis"  . Colectomy  07/2008    "got all the cancer out"  . Hernia repair  02/2009; 01/20/2014    IHR; VHR  . Ventral hernia repair N/A 01/20/2014    Procedure: OPEN REPAIR OF A RECURRENT VENTRAL HERNIA REPAIR;  Surgeon: [name]. [name], MD;  Location: [facility];  Service: General;  Laterality: N/A;  . Insertion of mesh N/A 01/20/2014    Procedure: INSERTION OF MESH;  Surgeon: [name]. [name], MD;  Location: [facility];  Service: General;  Laterality:  N/A;   Current Outpatient Prescriptions on File Prior to Visit  Medication Sig Dispense Refill  . ACCU-CHEK AVIVA PLUS test strip USE AS DIRECTED THREE TIMES DAILY 100 each 3  . Cholecalciferol (VITAMIN D-3 PO) Take 1 capsule by mouth daily.    . famotidine (PEPCID) 20 MG tablet Take 1 tablet (20 mg total) by mouth 2 (two) times daily as needed for heartburn or indigestion. 60 tablet 4  . HYDROcodone-homatropine (HYCODAN) 5-1.5 MG/5ML syrup Take 5 mLs by mouth every 8 (eight) hours as needed for cough. 120 mL 0  . Insulin Glargine (LANTUS SOLOSTAR) 100 UNIT/ML Solostar Pen Inject 60 Units into the skin daily. 15 mL 3  . metFORMIN (GLUCOPHAGE) 1000 MG tablet Take 1,000 mg by mouth 2 (two) times daily with a meal.    . metoCLOPramide (REGLAN) 10 MG tablet Take 1 tablet (10 mg total) by mouth 4 (four) times daily. 120 tablet 4  . pioglitazone (ACTOS) 30 MG tablet Take 1 tablet (30 mg total) by mouth daily. 30 tablet 11  . promethazine (PHENERGAN) 25 MG tablet Take 1 tablet (25 mg total) by mouth every 8 (eight) hours as needed for nausea or vomiting. 20 tablet 0  . rosuvastatin (CRESTOR) 20 MG tablet Take 20  mg by mouth at bedtime.     No current facility-administered medications on file prior to visit.   No Known Allergies History   Social History  . Marital Status: Single    Spouse Name: N/A  . Number of Children: N/A  . Years of Education: N/A   Occupational History  . Not on file.   Social History Main Topics  . Smoking status: Never Smoker   . Smokeless tobacco: Never Used  . Alcohol Use: No  . Drug Use: No  . Sexual Activity: Not Currently   Other Topics Concern  . Not on file   Social History Narrative   Family History  Problem Relation Age of Onset  . Cancer Mother 16    breast cancer  . Breast cancer Mother   . Diabetes Mother   . Diabetes Sister   . Emphysema Father     Review of Systems  All other systems reviewed and are negative.      Objective:    Physical Exam  Constitutional: He is oriented to person, place, and time. He appears well-developed and well-nourished. No distress.  HENT:  Head: Normocephalic and atraumatic.  Right Ear: External ear normal.  Left Ear: External ear normal.  Nose: Nose normal.  Mouth/Throat: Oropharynx is clear and moist. No oropharyngeal exudate.  Eyes: Conjunctivae and EOM are normal. Pupils are equal, round, and reactive to light. Right eye exhibits no discharge. Left eye exhibits no discharge. No scleral icterus.  Neck: Normal range of motion. Neck supple. No JVD present. No tracheal deviation present. No thyromegaly present.  Cardiovascular: Normal rate, regular rhythm, normal heart sounds and intact distal pulses.  Exam reveals no gallop and no friction rub.   No murmur heard. Pulmonary/Chest: Effort normal and breath sounds normal. No stridor. No respiratory distress. He has no wheezes. He has no rales. He exhibits no tenderness.  Abdominal: Soft. Bowel sounds are normal. He exhibits no distension and no mass. There is no tenderness. There is no rebound and no guarding.  Musculoskeletal: Normal range of motion. He exhibits no edema or tenderness.  Lymphadenopathy:    He has no cervical adenopathy.  Neurological: He is alert and oriented to person, place, and time. He has normal reflexes. He displays normal reflexes. No cranial nerve deficit. He exhibits normal muscle tone. Coordination normal.  Skin: Skin is warm. No rash noted. He is not diaphoretic. No erythema. No pallor.  Psychiatric: He has a normal mood and affect. His behavior is normal. Judgment and thought content normal.  Vitals reviewed.         Assessment & Plan:  Diabetes mellitus type II, uncontrolled - Plan: CBC with Differential/Platelet, COMPLETE METABOLIC PANEL WITH GFR, Lipid panel, Hemoglobin A1c, Microalbumin, urine, Ambulatory referral to Ophthalmology  Routine general medical examination at a health care facility  I  recommended the patient begin taking Lantus 30 units twice daily. Check fasting and two-hour postprandial sugars and notify me in one week so that we can titrate his insulin further. I have recommended that he begin taking aspirin 81 mg by mouth daily. I continue to recommend diet exercise and weight loss. I will also schedule the patient to see an ophthalmologist. Blood pressure is adequate. I will check a fasting lipid panel. Goal LDL cholesterol is less than 130. Patient will receive Pneumovax 23 today.I will also check a urine microalbumin and if elevated I will start the patient on an angiotensin receptor blocker

## 2015-03-03 ENCOUNTER — Encounter: Payer: Self-pay | Admitting: Family Medicine

## 2015-03-03 LAB — HEMOGLOBIN A1C
Hgb A1c MFr Bld: 8.4 % — ABNORMAL HIGH (ref <5.7)
Mean Plasma Glucose: 194 mg/dL — ABNORMAL HIGH (ref <117)

## 2015-03-03 LAB — MICROALBUMIN, URINE: MICROALB UR: 0.8 mg/dL (ref <2.0)

## 2015-03-12 ENCOUNTER — Other Ambulatory Visit: Payer: Self-pay | Admitting: Family Medicine

## 2015-03-14 NOTE — Telephone Encounter (Signed)
Medication refilled per protocol. 

## 2015-04-03 ENCOUNTER — Telehealth: Payer: Self-pay | Admitting: Family Medicine

## 2015-04-03 NOTE — Telephone Encounter (Signed)
Pt called and states that his FBS has been running between 170-140 and 180 after he eats. I asked him for specific numbers and he states that "she" put them up somewhere and he dont know where they are.

## 2015-04-03 NOTE — Telephone Encounter (Signed)
Patient calling to talk to you regarding his sugar readings  [phone]

## 2015-04-04 NOTE — Telephone Encounter (Signed)
Pt aware.

## 2015-04-04 NOTE — Telephone Encounter (Signed)
Verify he is taking lantus 30 units bid.  If so, goal fasting blood sugars are below 130 so I would increase lantus to 35 units bid and recheck fbs and 2 hr pps in 1 week.

## 2015-04-20 ENCOUNTER — Telehealth: Payer: Self-pay | Admitting: *Deleted

## 2015-04-20 NOTE — Telephone Encounter (Signed)
Submitted humana referral thru acuity connect for authorization on 04/19/15 to Dr. Leland Her with authorization [id number]  Requesting provider: Flonnie Hailstone  Treating provider: Leland Her  Number of visits: 6  Start Date: 04/19/15  End Date:10/16/15  Dx: E11.9-Type 2 diabetes mellitus without complications

## 2015-04-22 ENCOUNTER — Other Ambulatory Visit: Payer: Self-pay | Admitting: Cardiology

## 2015-04-24 NOTE — Telephone Encounter (Signed)
ok 

## 2015-04-25 NOTE — Telephone Encounter (Signed)
ok 

## 2015-05-25 ENCOUNTER — Telehealth: Payer: Self-pay | Admitting: Hematology and Oncology

## 2015-05-25 NOTE — Telephone Encounter (Signed)
returned call and s.w. pt and confirm appt °

## 2015-05-29 ENCOUNTER — Other Ambulatory Visit (HOSPITAL_BASED_OUTPATIENT_CLINIC_OR_DEPARTMENT_OTHER): Payer: Commercial Managed Care - HMO

## 2015-05-29 ENCOUNTER — Other Ambulatory Visit: Payer: Self-pay

## 2015-05-29 DIAGNOSIS — Z859 Personal history of malignant neoplasm, unspecified: Secondary | ICD-10-CM | POA: Diagnosis not present

## 2015-05-29 DIAGNOSIS — D3A8 Other benign neuroendocrine tumors: Secondary | ICD-10-CM

## 2015-05-29 LAB — CBC WITH DIFFERENTIAL/PLATELET
BASO%: 0.7 % (ref 0.0–2.0)
Basophils Absolute: 0 10*3/uL (ref 0.0–0.1)
EOS ABS: 0.1 10*3/uL (ref 0.0–0.5)
EOS%: 2.5 % (ref 0.0–7.0)
HEMATOCRIT: 41.2 % (ref 38.4–49.9)
HEMOGLOBIN: 14.1 g/dL (ref 13.0–17.1)
LYMPH%: 42.5 % (ref 14.0–49.0)
MCH: 37.4 pg — ABNORMAL HIGH (ref 27.2–33.4)
MCHC: 34.2 g/dL (ref 32.0–36.0)
MCV: 109.3 fL — AB (ref 79.3–98.0)
MONO#: 0.2 10*3/uL (ref 0.1–0.9)
MONO%: 5.4 % (ref 0.0–14.0)
NEUT%: 48.9 % (ref 39.0–75.0)
NEUTROS ABS: 2.2 10*3/uL (ref 1.5–6.5)
PLATELETS: 162 10*3/uL (ref 140–400)
RBC: 3.77 10*6/uL — ABNORMAL LOW (ref 4.20–5.82)
RDW: 14.5 % (ref 11.0–14.6)
WBC: 4.6 10*3/uL (ref 4.0–10.3)
lymph#: 1.9 10*3/uL (ref 0.9–3.3)

## 2015-05-29 LAB — COMPREHENSIVE METABOLIC PANEL (CC13)
ALBUMIN: 3.8 g/dL (ref 3.5–5.0)
ALK PHOS: 53 U/L (ref 40–150)
ALT: 18 U/L (ref 0–55)
ANION GAP: 9 meq/L (ref 3–11)
AST: 24 U/L (ref 5–34)
BILIRUBIN TOTAL: 0.53 mg/dL (ref 0.20–1.20)
BUN: 6.7 mg/dL — ABNORMAL LOW (ref 7.0–26.0)
CALCIUM: 9.5 mg/dL (ref 8.4–10.4)
CO2: 27 mEq/L (ref 22–29)
Chloride: 104 mEq/L (ref 98–109)
Creatinine: 0.7 mg/dL (ref 0.7–1.3)
GLUCOSE: 143 mg/dL — AB (ref 70–140)
Potassium: 3.7 mEq/L (ref 3.5–5.1)
Sodium: 141 mEq/L (ref 136–145)
TOTAL PROTEIN: 7.1 g/dL (ref 6.4–8.3)

## 2015-06-02 LAB — CHROMOGRANIN A: Chromogranin A: <5 ng/mL (ref <=15)

## 2015-06-04 NOTE — Assessment & Plan Note (Signed)
Neuroendocrine carcinoid tumor of the ileium that was diagnosed in 2006. He is s/p resection and has no signs of recurrence.   His current lab work is stable and I reviewed his results with him and his mother in detail. Recent CT scan in March, 2015 demonstrates no metastasis or recurrence of cancer. Darick will return annually with CBC, CMP and chromogranin A. The patient is in agreement with the plan.   The patient will return in 1 year for labs and evaluation.

## 2015-06-05 ENCOUNTER — Telehealth: Payer: Self-pay | Admitting: *Deleted

## 2015-06-05 ENCOUNTER — Ambulatory Visit (HOSPITAL_BASED_OUTPATIENT_CLINIC_OR_DEPARTMENT_OTHER): Payer: Commercial Managed Care - HMO | Admitting: Hematology and Oncology

## 2015-06-05 ENCOUNTER — Encounter: Payer: Self-pay | Admitting: Hematology and Oncology

## 2015-06-05 ENCOUNTER — Telehealth: Payer: Self-pay | Admitting: Hematology and Oncology

## 2015-06-05 ENCOUNTER — Other Ambulatory Visit: Payer: Self-pay | Admitting: Family Medicine

## 2015-06-05 VITALS — BP 106/77 | HR 82 | Temp 98.1°F | Resp 18 | Ht 69.0 in | Wt 236.9 lb

## 2015-06-05 DIAGNOSIS — C7B8 Other secondary neuroendocrine tumors: Secondary | ICD-10-CM | POA: Diagnosis not present

## 2015-06-05 MED ORDER — FAMOTIDINE 20 MG PO TABS: 20.0000 mg | ORAL_TABLET | Freq: Two times a day (BID) | ORAL | Status: DC | PRN

## 2015-06-05 NOTE — Telephone Encounter (Signed)
Medication refill for one time only.  Patient needs to be seen.  Pharmacy called for refill and told them to tell him NTBS

## 2015-06-05 NOTE — Progress Notes (Signed)
Patient Care Team: Susy Frizzle, MD as PCP - General (Family Medicine)  DIAGNOSIS: No matching staging information was found for the patient.  SUMMARY OF ONCOLOGIC HISTORY:   Secondary neuroendocrine tumors   07/24/2005 Surgery Ileum resection for neuroendocrine carcinoid tumor    CHIEF COMPLIANT:  Follow-up of carcinoid tumor  INTERVAL HISTORY: [name] is a  49 year old gentleman with above-mentioned history of carcinoid tumor of the ileum status post resection. This was in 2006. Is now 10 years past his surgery. We are seeing him on an annual basis. He reports intermittent abdominal pain. He denies any diarrhea or constipation. He looks extremely tired. His cardiac tattoos all over his body. He is accompanied by his mother who is also our patient.  REVIEW OF SYSTEMS:   Constitutional: Denies fevers, chills or abnormal weight loss Eyes: Denies blurriness of vision Ears, nose, mouth, throat, and face: Denies mucositis or sore throat Respiratory: Denies cough, dyspnea or wheezes Cardiovascular: Denies palpitation, chest discomfort or lower extremity swelling Gastrointestinal:   Lower abdominal pain Skin: Denies abnormal skin rashes Lymphatics: Denies new lymphadenopathy or easy bruising Neurological:Denies numbness, tingling or new weaknesses Behavioral/Psych: Mood is stable, no new changes  All other systems were reviewed with the patient and are negative.  I have reviewed the past medical history, past surgical history, social history and family history with the patient and they are unchanged from previous note.  ALLERGIES:  has No Known Allergies.  MEDICATIONS:  Current Outpatient Prescriptions  Medication Sig Dispense Refill  . ACCU-CHEK AVIVA PLUS test strip USE AS DIRECTED THREE TIMES DAILY 100 each 3  . Cholecalciferol (VITAMIN D-3 PO) Take 1 capsule by mouth daily.    . CRESTOR 20 MG tablet TAKE 1 TABLET BY MOUTH EVERY DAY. 30 tablet 5  . famotidine (PEPCID) 20  MG tablet Take 1 tablet (20 mg total) by mouth 2 (two) times daily as needed for heartburn or indigestion. 60 tablet 0  . HYDROcodone-homatropine (HYCODAN) 5-1.5 MG/5ML syrup Take 5 mLs by mouth every 8 (eight) hours as needed for cough. 120 mL 0  . Insulin Glargine (LANTUS SOLOSTAR) 100 UNIT/ML Solostar Pen Inject 30 Units into the skin 2 (two) times daily. 20 mL 5  . metFORMIN (GLUCOPHAGE) 1000 MG tablet Take 1,000 mg by mouth 2 (two) times daily with a meal.    . metoCLOPramide (REGLAN) 10 MG tablet Take 1 tablet (10 mg total) by mouth 4 (four) times daily. 120 tablet 4  . pioglitazone (ACTOS) 30 MG tablet TAKE 1 TABLET BY MOUTH EVERY DAY. 30 tablet 3  . promethazine (PHENERGAN) 25 MG tablet Take 1 tablet (25 mg total) by mouth every 8 (eight) hours as needed for nausea or vomiting. 20 tablet 0   No current facility-administered medications for this visit.    PHYSICAL EXAMINATION: ECOG PERFORMANCE STATUS: 1 - Symptomatic but completely ambulatory  Filed Vitals:   06/05/15 1022  BP: 106/77  Pulse: 82  Temp: 98.1 F (36.7 C)  Resp: 18   Filed Weights   06/05/15 1022  Weight: 236 lb 14.4 oz (107.457 kg)    GENERAL:alert, no distress and comfortable SKIN: skin color, texture, turgor are normal, no rashes or significant lesions EYES: normal, Conjunctiva are pink and non-injected, sclera clear OROPHARYNX:no exudate, no erythema and lips, buccal mucosa, and tongue normal  NECK: supple, thyroid normal size, non-tender, without nodularity LYMPH:  no palpable lymphadenopathy in the cervical, axillary or inguinal LUNGS: clear to auscultation and percussion with normal breathing  effort HEART: regular rate & rhythm and no murmurs and no lower extremity edema ABDOMEN: tenderness in the lower abdomen Musculoskeletal:no cyanosis of digits and no clubbing  NEURO: alert & oriented x 3 with fluent speech, no focal motor/sensory deficits  LABORATORY DATA:  I have reviewed the data as listed    Chemistry      Component Value Date/Time   NA 141 05/29/2015 1025   NA 143 03/02/2015 0820   NA 131 10/26/2009 1008   K 3.7 05/29/2015 1025   K 3.8 03/02/2015 0820   K 3.9 10/26/2009 1008   CL 101 03/02/2015 0820   CL 100 11/02/2012 0955   CL 100 10/26/2009 1008   CO2 27 05/29/2015 1025   CO2 27 03/02/2015 0820   CO2 28 10/26/2009 1008   BUN 6.7* 05/29/2015 1025   BUN 5* 03/02/2015 0820   BUN 10 10/26/2009 1008   CREATININE 0.7 05/29/2015 1025   CREATININE 0.66 03/02/2015 0820   CREATININE 0.62 01/22/2014 0530      Component Value Date/Time   CALCIUM 9.5 05/29/2015 1025   CALCIUM 9.3 03/02/2015 0820   CALCIUM 9.0 10/26/2009 1008   ALKPHOS 53 05/29/2015 1025   ALKPHOS 55 03/02/2015 0820   ALKPHOS 86* 10/26/2009 1008   AST 24 05/29/2015 1025   AST 18 03/02/2015 0820   AST 32 10/26/2009 1008   ALT 18 05/29/2015 1025   ALT 15 03/02/2015 0820   ALT 43 10/26/2009 1008   BILITOT 0.53 05/29/2015 1025   BILITOT 0.7 03/02/2015 0820   BILITOT 0.80 10/26/2009 1008       Lab Results  Component Value Date   WBC 4.6 05/29/2015   HGB 14.1 05/29/2015   HCT 41.2 05/29/2015   MCV 109.3* 05/29/2015   PLT 162 05/29/2015   NEUTROABS 2.2 05/29/2015   ASSESSMENT & PLAN:  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 He is s/p resection and has no signs of recurrence.   His current lab work is stable and I reviewed his results with him and his mother in detail. Recent CT scan in March, 2015 demonstrates no metastasis or recurrence of cancer. Amit will return annually with CBC, CMP and chromogranin A. The patient is in agreement with the plan.  His chromogranin A levels of less than 5.  The patient will return in 1 year for labs and evaluation.   No orders of the defined types were placed in this encounter.   The patient has a good understanding of the overall plan. he agrees with it. he will call with any  problems that may develop before the next visit here.   Rulon Eisenmenger, MD

## 2015-06-05 NOTE — Telephone Encounter (Signed)
Submitted humana referral thru acuity connect for authorization on 06/02/15 to Dr. Chanetta Marshall with authorization number [id number]  Requesting [name]: [name]  Treating Lavere Shinsky: Chanetta Marshall   Number of visits:6  Start Date: 06/05/2015  End Date: 12/02/15  Dx: C7B.8- Other secondary neuroendocrine tumors  Dr. Lindi Adie office aware of authorization via phne.

## 2015-06-05 NOTE — Telephone Encounter (Signed)
Gave avs & calendar for September 2016.

## 2015-06-11 ENCOUNTER — Other Ambulatory Visit: Payer: Self-pay | Admitting: Family Medicine

## 2015-06-12 ENCOUNTER — Telehealth: Payer: Self-pay | Admitting: Family Medicine

## 2015-06-12 NOTE — Telephone Encounter (Signed)
metFORMIN (GLUCOPHAGE) 1000 MG tablet 180 tablet 0 06/12/2015      Sig: TAKE 1 TABLET BY MOUTH TWICE DAILY WITH A MEAL    Notes to Pharmacy: Needs office visit and labs before further refills    E-Prescribing Status: Receipt confirmed by pharmacy (06/12/2015 9:24 AM EDT)    Was sent and confirmed

## 2015-06-12 NOTE — Telephone Encounter (Signed)
PATIENT REQUESTING REFILL ON HIS [place]

## 2015-07-13 ENCOUNTER — Other Ambulatory Visit: Payer: Self-pay | Admitting: Family Medicine

## 2015-07-13 NOTE — Telephone Encounter (Signed)
Refill appropriate and filled per protocol. 

## 2015-08-13 ENCOUNTER — Other Ambulatory Visit: Payer: Self-pay | Admitting: Family Medicine

## 2015-08-26 ENCOUNTER — Other Ambulatory Visit: Payer: Self-pay | Admitting: Family Medicine

## 2015-08-28 ENCOUNTER — Other Ambulatory Visit: Payer: Self-pay | Admitting: Family Medicine

## 2015-09-18 ENCOUNTER — Other Ambulatory Visit: Payer: Self-pay | Admitting: Family Medicine

## 2015-09-18 NOTE — Telephone Encounter (Signed)
Refill appropriate and filled per protocol. 

## 2015-11-16 ENCOUNTER — Encounter: Payer: Self-pay | Admitting: Family Medicine

## 2015-11-16 ENCOUNTER — Other Ambulatory Visit: Payer: Self-pay | Admitting: Family Medicine

## 2015-11-16 NOTE — Telephone Encounter (Signed)
Medication refill for one time only.  Patient needs to be seen.  Letter sent for patient to call and schedule 

## 2015-11-22 ENCOUNTER — Encounter: Payer: Self-pay | Admitting: Physician Assistant

## 2015-11-22 ENCOUNTER — Ambulatory Visit (INDEPENDENT_AMBULATORY_CARE_PROVIDER_SITE_OTHER): Payer: Commercial Managed Care - HMO | Admitting: Physician Assistant

## 2015-11-22 VITALS — BP 122/86 | HR 84 | Temp 97.9°F | Resp 18 | Wt 249.0 lb

## 2015-11-22 DIAGNOSIS — L239 Allergic contact dermatitis, unspecified cause: Secondary | ICD-10-CM

## 2015-11-22 DIAGNOSIS — L200 Besnier's prurigo: Secondary | ICD-10-CM

## 2015-11-22 NOTE — Progress Notes (Signed)
Patient ID: [name] MRN: [id number], DOB: 1966-02-26, 50 y.o. Date of Encounter: 11/22/2015, 10:39 AM    Chief Complaint:  Chief Complaint  Patient presents with  . c/o swollen rt foot    no idea how long it has been that way  .          HPI: 50 y.o. year old male presents with above.  No other complaints or concerns. I had him take off shoes and socks of both feet. Discussed with him that the size of the left foot is the same as the right foot and that it does not appear more swollen than the left foot. Then he says that the problem is itching--and points to the lateral aspect of the right foot just below the lateral malleolus region. That region does have some excoriation marks/scabs.     Home Meds:   Outpatient Prescriptions Prior to Visit  Medication Sig Dispense Refill  . ACCU-CHEK AVIVA PLUS test strip USE AS DIRECTED THREE TIMES DAILY 100 each 3  . Cholecalciferol (VITAMIN D-3 PO) Take 1 capsule by mouth daily.    . Insulin Glargine (LANTUS SOLOSTAR) 100 UNIT/ML Solostar Pen Inject 30 Units into the skin 2 (two) times daily. 20 mL 5  . metFORMIN (GLUCOPHAGE) 1000 MG tablet TAKE 1 TABLET BY MOUTH TWICE DAILY WITH A MEAL 180 tablet 0  . metoCLOPramide (REGLAN) 10 MG tablet Take 1 tablet (10 mg total) by mouth 4 (four) times daily. 120 tablet 4  . pioglitazone (ACTOS) 30 MG tablet TAKE 1 TABLET(30 MG) BY MOUTH DAILY 90 tablet 0  . rosuvastatin (CRESTOR) 20 MG tablet TAKE 1 TABLET BY MOUTH EVERY DAY 90 tablet 0  . famotidine (PEPCID) 20 MG tablet TAKE 1 TABLET BY MOUTH TWICE DAILY AS NEEDED FOR HEARTBURN OR INDIGESTION (Patient not taking: Reported on 11/22/2015) 60 tablet 11  . HYDROcodone-homatropine (HYCODAN) 5-1.5 MG/5ML syrup Take 5 mLs by mouth every 8 (eight) hours as needed for cough. 120 mL 0  . promethazine (PHENERGAN) 25 MG tablet Take 1 tablet (25 mg total) by mouth every 8 (eight) hours as needed for nausea or vomiting. 20 tablet 0   No  facility-administered medications prior to visit.    Allergies: No Known Allergies    Review of Systems: See HPI for pertinent ROS. All other ROS negative.    Physical Exam: Blood pressure 122/86, pulse 84, temperature 97.9 F (36.6 C), temperature source Oral, resp. rate 18, weight 249 lb (112.946 kg)., Body mass index is 36.75 kg/(m^2). General:  WM. Appears in no acute distress. Neck: Supple. No thyromegaly. No lymphadenopathy. Lungs: Clear bilaterally to auscultation without wheezes, rales, or rhonchi. Breathing is unlabored. Heart: Regular rhythm. No murmurs, rubs, or gallops. Msk:  Strength and tone normal for age. Extremities/Skin: There is no pitting edema of either the left or right foot. There is no pitting edema of either ankle. There are some scab/excoriation along the lateral aspect of the right foot just below the lateral malleolus. Neuro: Alert and oriented X 3. Moves all extremities spontaneously. Gait is normal. CNII-XII grossly in tact. Psych:  Responds to questions appropriately with a normal affect.     ASSESSMENT AND PLAN:  50 y.o. year old male with  1. Allergic dermatitis To the affected region he is to apply Neosporin--to help heal the excoriation regions. He is to then apply hydrocortisone cream--to relieve itching.  Follow-up if needed.   Signed, [address] [place], [place], [facility] 11/22/2015 10:39 AM

## 2015-12-17 ENCOUNTER — Other Ambulatory Visit: Payer: Self-pay | Admitting: Family Medicine

## 2016-01-16 ENCOUNTER — Other Ambulatory Visit: Payer: Self-pay | Admitting: Family Medicine

## 2016-01-16 ENCOUNTER — Ambulatory Visit (INDEPENDENT_AMBULATORY_CARE_PROVIDER_SITE_OTHER): Payer: Commercial Managed Care - HMO | Admitting: Family Medicine

## 2016-01-16 ENCOUNTER — Encounter: Payer: Self-pay | Admitting: Family Medicine

## 2016-01-16 VITALS — BP 118/64 | HR 88 | Temp 98.2°F | Resp 18 | Ht 71.0 in | Wt 253.0 lb

## 2016-01-16 DIAGNOSIS — E1100 Type 2 diabetes mellitus with hyperosmolarity without nonketotic hyperglycemic-hyperosmolar coma (NKHHC): Secondary | ICD-10-CM | POA: Diagnosis not present

## 2016-01-16 DIAGNOSIS — Z91199 Patient's noncompliance with other medical treatment and regimen due to unspecified reason: Secondary | ICD-10-CM

## 2016-01-16 DIAGNOSIS — Z794 Long term (current) use of insulin: Secondary | ICD-10-CM | POA: Diagnosis not present

## 2016-01-16 DIAGNOSIS — Z9119 Patient's noncompliance with other medical treatment and regimen: Secondary | ICD-10-CM | POA: Diagnosis not present

## 2016-01-16 LAB — LIPID PANEL
CHOL/HDL RATIO: 3.4 ratio (ref <=5.0)
Cholesterol: 109 mg/dL — ABNORMAL LOW (ref 125–200)
HDL: 32 mg/dL — AB (ref >=40)
LDL Cholesterol: 53 mg/dL (ref <130)
TRIGLYCERIDES: 119 mg/dL (ref <150)
VLDL: 24 mg/dL (ref <30)

## 2016-01-16 LAB — CBC WITH DIFFERENTIAL/PLATELET
Basophils Absolute: 0 cells/uL (ref 0–200)
Basophils Relative: 0 %
EOS ABS: 117 {cells}/uL (ref 15–500)
EOS PCT: 3 %
HCT: 41.6 % (ref 38.5–50.0)
Hemoglobin: 14.3 g/dL (ref 13.0–17.0)
Lymphocytes Relative: 58 %
Lymphs Abs: 2262 cells/uL (ref 850–3900)
MCH: 36.2 pg — ABNORMAL HIGH (ref 27.0–33.0)
MCHC: 34.4 g/dL (ref 32.0–36.0)
MCV: 105.3 fL — ABNORMAL HIGH (ref 80.0–100.0)
MONOS PCT: 7 %
MPV: 11.4 fL (ref 7.5–12.5)
Monocytes Absolute: 273 cells/uL (ref 200–950)
NEUTROS ABS: 1248 {cells}/uL — AB (ref 1500–7800)
NEUTROS PCT: 32 %
Platelets: 161 10*3/uL (ref 140–400)
RBC: 3.95 MIL/uL — ABNORMAL LOW (ref 4.20–5.80)
RDW: 14 % (ref 11.0–15.0)
WBC: 3.9 10*3/uL (ref 3.8–10.8)

## 2016-01-16 LAB — HEMOGLOBIN A1C
Hgb A1c MFr Bld: 6.6 % — ABNORMAL HIGH (ref <5.7)
MEAN PLASMA GLUCOSE: 143 mg/dL

## 2016-01-16 LAB — COMPLETE METABOLIC PANEL WITH GFR
ALBUMIN: 3.8 g/dL (ref 3.6–5.1)
ALK PHOS: 56 U/L (ref 40–115)
ALT: 14 U/L (ref 9–46)
AST: 15 U/L (ref 10–40)
BILIRUBIN TOTAL: 0.5 mg/dL (ref 0.2–1.2)
BUN: 4 mg/dL — AB (ref 7–25)
CO2: 25 mmol/L (ref 20–31)
CREATININE: 0.53 mg/dL — AB (ref 0.60–1.35)
Calcium: 9 mg/dL (ref 8.6–10.3)
Chloride: 104 mmol/L (ref 98–110)
GFR, Est African American: >89 mL/min (ref >=60)
GFR, Est Non African American: >89 mL/min (ref >=60)
GLUCOSE: 163 mg/dL — AB (ref 70–99)
Potassium: 3.9 mmol/L (ref 3.5–5.3)
SODIUM: 139 mmol/L (ref 135–146)
TOTAL PROTEIN: 6.8 g/dL (ref 6.1–8.1)

## 2016-01-16 NOTE — Progress Notes (Signed)
Subjective:    Patient ID: [name], male    DOB: 1966/08/07, 50 y.o.   MRN: [id number]  HPI  03/02/15 Patient is a 50 year old white male who has a history of type 2 diabetes mellitus poorly controlled secondary to noncompliance. Last hemoglobin A1c was 9.5 in February. At that time I recommended increasing Lantus to 60 units a day. Patient did that one day and then quit the change in his insulin because he states his left arm went numb. He has not been checking his sugars but when he does it is usually over 200. He denies any blurred vision, polyuria, polydipsia, or numbness and tingling in the feet. He is not taking a baby aspirin. He is overdue for diabetic eye exam. He is overdue for a pneumonia vaccine. Patient's last colonoscopy was in 2013. He was given a 10 year clearance and is not due again until 2023. He has no family history of prostate cancer and therefore is not yet due for a PSA. Blood pressure today is well controlled. At that time, my plan was: I recommended the patient begin taking Lantus 30 units twice daily. Check fasting and two-hour postprandial sugars and notify me in one week so that we can titrate his insulin further. I have recommended that he begin taking aspirin 81 mg by mouth daily. I continue to recommend diet exercise and weight loss. I will also schedule the patient to see an ophthalmologist. Blood pressure is adequate. I will check a fasting lipid panel. Goal LDL cholesterol is less than 130. Patient will receive Pneumovax 23 today.I will also check a urine microalbumin and if elevated I will start the patient on an angiotensin receptor blocker  01/16/16 Patient has been seen since. He was taking Lantus 30 units twice daily that he independently switch this back to 40 units once a day. He is not checking his blood sugar. He is only checked his blood sugar once since I last saw him. His fasting blood sugar was 178 on Sunday. He denies any hypoglycemic episodes. He  denies any neuropathy in his feet. He denies any chest pain shortness of breath or dyspnea on exertion. He denies any polyuria, polydipsia. He does report blurred vision. He is overdue for diabetic eye exam. Painful his blood pressures controlled today. Past Medical History  Diagnosis Date  . Cancer [facility]) carcinoid  . Hypertension   . GERD (gastroesophageal reflux disease)   . Ventral hernia   . UTI (lower urinary tract infection)   . Colon cancer ([place])   . Shortness of breath     with exertion  . Hyperlipemia   . Kidney stones   . Type II diabetes mellitus [facility])    Past Surgical History  Procedure Laterality Date  . Cholecystectomy    . Abdominal hernia repair  01/20/2014    recurrent VHR w/mesh  . Kidney stone surgery  1999    "had to open me up after they busted; cut just above pubis"  . Colectomy  07/2008    "got all the cancer out"  . Hernia repair  02/2009; 01/20/2014    IHR; VHR  . Ventral hernia repair N/A 01/20/2014    Procedure: OPEN REPAIR OF A RECURRENT VENTRAL HERNIA REPAIR;  Surgeon: [name]. [name], MD;  Location: [place];  Service: General;  Laterality: N/A;  . Insertion of mesh N/A 01/20/2014    Procedure: INSERTION OF MESH;  Surgeon: [name]. [name], MD;  Location: [place];  Service: General;  Laterality: N/A;   Current Outpatient Prescriptions on File Prior to Visit  Medication Sig Dispense Refill  . ACCU-CHEK AVIVA PLUS test strip USE AS DIRECTED THREE TIMES DAILY 100 each 3  . Insulin Glargine (LANTUS SOLOSTAR) 100 UNIT/ML Solostar Pen Inject 30 Units into the skin 2 (two) times daily. 20 mL 5  . metFORMIN (GLUCOPHAGE) 1000 MG tablet TAKE 1 TABLET BY MOUTH TWICE DAILY WITH A MEAL 180 tablet 0  . pioglitazone (ACTOS) 30 MG tablet TAKE 1 TABLET(30 MG) BY MOUTH DAILY 90 tablet 0  . rosuvastatin (CRESTOR) 20 MG tablet TAKE 1 TABLET BY MOUTH EVERY DAY 90 tablet 0   No current facility-administered medications on file prior to visit.   No Known Allergies Social  History   Social History  . Marital Status: Single    Spouse Name: N/A  . Number of Children: N/A  . Years of Education: N/A   Occupational History  . Not on file.   Social History Main Topics  . Smoking status: Never Smoker   . Smokeless tobacco: Never Used  . Alcohol Use: No  . Drug Use: No  . Sexual Activity: Not Currently   Other Topics Concern  . Not on file   Social History Narrative   Family History  Problem Relation Age of Onset  . Cancer Mother 40    breast cancer  . Breast cancer Mother   . Diabetes Mother   . Diabetes Sister   . Emphysema Father     Review of Systems  All other systems reviewed and are negative.      Objective:   Physical Exam  Constitutional: He is oriented to person, place, and time. He appears well-developed and well-nourished. No distress.  HENT:  Head: Normocephalic and atraumatic.  Right Ear: External ear normal.  Left Ear: External ear normal.  Nose: Nose normal.  Mouth/Throat: Oropharynx is clear and moist. No oropharyngeal exudate.  Eyes: Conjunctivae and EOM are normal. Pupils are equal, round, and reactive to light. Right eye exhibits no discharge. Left eye exhibits no discharge. No scleral icterus.  Neck: Normal range of motion. Neck supple. No JVD present. No tracheal deviation present. No thyromegaly present.  Cardiovascular: Normal rate, regular rhythm, normal heart sounds and intact distal pulses.  Exam reveals no gallop and no friction rub.   No murmur heard. Pulmonary/Chest: Effort normal and breath sounds normal. No stridor. No respiratory distress. He has no wheezes. He has no rales. He exhibits no tenderness.  Abdominal: Soft. Bowel sounds are normal. He exhibits no distension and no mass. There is no tenderness. There is no rebound and no guarding.  Musculoskeletal: Normal range of motion. He exhibits no edema or tenderness.  Lymphadenopathy:    He has no cervical adenopathy.  Neurological: He is alert and  oriented to person, place, and time. He has normal reflexes. No cranial nerve deficit. He exhibits normal muscle tone. Coordination normal.  Skin: Skin is warm. No rash noted. He is not diaphoretic. No erythema. No pallor.  Psychiatric: He has a normal mood and affect. His behavior is normal. Judgment and thought content normal.  Vitals reviewed.         Assessment & Plan:  Uncontrolled type 2 diabetes mellitus with hyperosmolarity without coma, with long-term current use of insulin ([place]) - Plan: COMPLETE METABOLIC PANEL WITH GFR, CBC with Differential/Platelet, Hemoglobin A1c, Lipid panel, Microalbumin, urine  Noncompliance  Patient independently changes his insulin based on no objective reason. He has discontinued Actos  and Reglan independently. I will check a fasting lipid panel. Goal LDL cholesterol is less than 100. I will check a hemoglobin A1c. If his hemoglobin A1c is greater than 7 as I expect, I will likely increase his Lantus to 50 units once a day. I've asked him to check his fasting blood sugars every morning and record the values for me so we can make additional changes to bring his fasting blood sugar less than 130. I will schedule the patient for diabetic eye exam

## 2016-01-16 NOTE — Telephone Encounter (Signed)
Refill appropriate and filled per protocol. 

## 2016-01-17 LAB — MICROALBUMIN, URINE

## 2016-01-18 ENCOUNTER — Other Ambulatory Visit: Payer: Self-pay | Admitting: Family Medicine

## 2016-01-18 MED ORDER — INSULIN GLARGINE 100 UNIT/ML SOLOSTAR PEN: 40.0000 [IU] | PEN_INJECTOR | Freq: Every day | SUBCUTANEOUS | Status: DC

## 2016-01-18 MED ORDER — BLOOD GLUCOSE METER KIT: PACK | Status: DC

## 2016-02-13 ENCOUNTER — Telehealth: Payer: Self-pay | Admitting: Family Medicine

## 2016-02-13 NOTE — Telephone Encounter (Signed)
Received fax from [facility] back Auth# [id number] Treating Provider Dr Warden Fillers # of Visits 6 Start Date 05/01/2016 End Date 10/28/2016 CPT 99499 DX E11.65, Z01.00

## 2016-02-20 ENCOUNTER — Other Ambulatory Visit: Payer: Self-pay | Admitting: Family Medicine

## 2016-04-21 ENCOUNTER — Other Ambulatory Visit: Payer: Self-pay | Admitting: Family Medicine

## 2016-04-22 NOTE — Telephone Encounter (Signed)
Refill appropriate and filled per protocol. 

## 2016-05-12 ENCOUNTER — Other Ambulatory Visit: Payer: Self-pay | Admitting: Family Medicine

## 2016-05-22 DIAGNOSIS — H04563 Stenosis of bilateral lacrimal punctum: Secondary | ICD-10-CM | POA: Diagnosis not present

## 2016-05-22 DIAGNOSIS — E119 Type 2 diabetes mellitus without complications: Secondary | ICD-10-CM | POA: Diagnosis not present

## 2016-05-22 DIAGNOSIS — H2513 Age-related nuclear cataract, bilateral: Secondary | ICD-10-CM | POA: Diagnosis not present

## 2016-05-22 LAB — HM DIABETES EYE EXAM

## 2016-05-27 ENCOUNTER — Other Ambulatory Visit: Payer: Commercial Managed Care - HMO

## 2016-06-03 ENCOUNTER — Ambulatory Visit: Payer: Commercial Managed Care - HMO | Admitting: Hematology and Oncology

## 2016-06-03 NOTE — Assessment & Plan Note (Deleted)
Neuroendocrine carcinoid tumor of the ileium that was diagnosed in 2006. He is s/p resection and has no signs of recurrence.  No clinical evidence of disease recurrence. Chromogranin A 05/26/2015: Less than 5 CT scan in March, 2015 demonstrates no metastasis or recurrence of cancer.  [name] will return annually with CBC, CMP and chromogranin A. The patient is in agreement with the plan.   The patient will return in 1 year for labs and evaluation.

## 2016-06-19 ENCOUNTER — Other Ambulatory Visit: Payer: Self-pay | Admitting: Family Medicine

## 2016-06-19 MED ORDER — ACCU-CHEK SOFTCLIX LANCET DEV MISC: 5 refills | Status: DC

## 2016-06-19 NOTE — Telephone Encounter (Signed)
Lancets sent to pharm

## 2016-06-26 ENCOUNTER — Encounter: Payer: Self-pay | Admitting: Family Medicine

## 2016-06-27 ENCOUNTER — Telehealth: Payer: Self-pay | Admitting: Family Medicine

## 2016-06-27 MED ORDER — INSULIN PEN NEEDLE 31G X 8 MM MISC: 3 refills | Status: DC

## 2016-06-27 MED ORDER — INSULIN GLARGINE 100 UNIT/ML SOLOSTAR PEN: 40.0000 [IU] | PEN_INJECTOR | Freq: Every day | SUBCUTANEOUS | 5 refills | Status: DC

## 2016-06-27 NOTE — Telephone Encounter (Signed)
Medication called/sent to requested pharmacy  

## 2016-06-27 NOTE — Telephone Encounter (Signed)
[facility] [address]  Asking for rx for pens for insulin  [phone]

## 2016-08-16 ENCOUNTER — Other Ambulatory Visit: Payer: Self-pay | Admitting: Family Medicine

## 2016-08-21 ENCOUNTER — Other Ambulatory Visit: Payer: Self-pay | Admitting: Family Medicine

## 2016-09-19 ENCOUNTER — Telehealth: Payer: Self-pay | Admitting: Hematology and Oncology

## 2016-09-19 NOTE — Telephone Encounter (Signed)
Pt called to r/s missed appt from Sept 2017. Gave pt new appt date/time

## 2016-10-15 ENCOUNTER — Other Ambulatory Visit: Payer: Self-pay | Admitting: Emergency Medicine

## 2016-10-15 DIAGNOSIS — C7B8 Other secondary neuroendocrine tumors: Secondary | ICD-10-CM

## 2016-10-16 ENCOUNTER — Telehealth: Payer: Self-pay | Admitting: Hematology and Oncology

## 2016-10-16 ENCOUNTER — Other Ambulatory Visit: Payer: Commercial Managed Care - HMO

## 2016-10-16 ENCOUNTER — Ambulatory Visit: Payer: Commercial Managed Care - HMO | Admitting: Hematology and Oncology

## 2016-10-16 NOTE — Telephone Encounter (Signed)
Pt mom called to r/s appts today due to being under the weather. Gave pt new appt 23/2 at 1045 am per request

## 2016-11-07 ENCOUNTER — Telehealth: Payer: Self-pay | Admitting: Hematology and Oncology

## 2016-11-07 NOTE — Telephone Encounter (Signed)
Patient called to r/s apt from 3/2 to 3/5. Aware of new date and time

## 2016-11-08 ENCOUNTER — Other Ambulatory Visit: Payer: Commercial Managed Care - HMO

## 2016-11-08 ENCOUNTER — Ambulatory Visit: Payer: Commercial Managed Care - HMO | Admitting: Hematology and Oncology

## 2016-11-11 ENCOUNTER — Other Ambulatory Visit (HOSPITAL_BASED_OUTPATIENT_CLINIC_OR_DEPARTMENT_OTHER): Payer: Medicare Other

## 2016-11-11 ENCOUNTER — Ambulatory Visit (HOSPITAL_BASED_OUTPATIENT_CLINIC_OR_DEPARTMENT_OTHER): Payer: Medicare Other | Admitting: Hematology and Oncology

## 2016-11-11 ENCOUNTER — Encounter: Payer: Self-pay | Admitting: Hematology and Oncology

## 2016-11-11 DIAGNOSIS — C7B8 Other secondary neuroendocrine tumors: Secondary | ICD-10-CM

## 2016-11-11 DIAGNOSIS — Z85060 Personal history of malignant carcinoid tumor of small intestine: Secondary | ICD-10-CM | POA: Diagnosis not present

## 2016-11-11 LAB — CBC WITH DIFFERENTIAL/PLATELET
BASO%: 1 % (ref 0.0–2.0)
Basophils Absolute: 0 10*3/uL (ref 0.0–0.1)
EOS ABS: 0.1 10*3/uL (ref 0.0–0.5)
EOS%: 4.2 % (ref 0.0–7.0)
HCT: 38.2 % — ABNORMAL LOW (ref 38.4–49.9)
HEMOGLOBIN: 13.3 g/dL (ref 13.0–17.1)
LYMPH%: 55 % — AB (ref 14.0–49.0)
MCH: 39.1 pg — AB (ref 27.2–33.4)
MCHC: 34.8 g/dL (ref 32.0–36.0)
MCV: 112.3 fL — AB (ref 79.3–98.0)
MONO#: 0.2 10*3/uL (ref 0.1–0.9)
MONO%: 6.5 % (ref 0.0–14.0)
NEUT%: 33.3 % — ABNORMAL LOW (ref 39.0–75.0)
NEUTROS ABS: 1.1 10*3/uL — AB (ref 1.5–6.5)
PLATELETS: 162 10*3/uL (ref 140–400)
RBC: 3.4 10*6/uL — AB (ref 4.20–5.82)
RDW: 15.4 % — ABNORMAL HIGH (ref 11.0–14.6)
WBC: 3.4 10*3/uL — ABNORMAL LOW (ref 4.0–10.3)
lymph#: 1.9 10*3/uL (ref 0.9–3.3)

## 2016-11-11 LAB — COMPREHENSIVE METABOLIC PANEL
ALBUMIN: 3.5 g/dL (ref 3.5–5.0)
ALK PHOS: 58 U/L (ref 40–150)
ALT: 20 U/L (ref 0–55)
ANION GAP: 10 meq/L (ref 3–11)
AST: 22 U/L (ref 5–34)
BILIRUBIN TOTAL: 1.37 mg/dL — AB (ref 0.20–1.20)
BUN: 4.7 mg/dL — ABNORMAL LOW (ref 7.0–26.0)
CO2: >30 mEq/L — ABNORMAL HIGH (ref 22–29)
Calcium: 8.8 mg/dL (ref 8.4–10.4)
Chloride: 101 mEq/L (ref 98–109)
Creatinine: 0.6 mg/dL — ABNORMAL LOW (ref 0.7–1.3)
GLUCOSE: 141 mg/dL — AB (ref 70–140)
POTASSIUM: 3.1 meq/L — AB (ref 3.5–5.1)
SODIUM: 141 meq/L (ref 136–145)
TOTAL PROTEIN: 6.6 g/dL (ref 6.4–8.3)

## 2016-11-11 NOTE — Assessment & Plan Note (Signed)
Neuroendocrine carcinoid tumor of the ileium that was diagnosed in 2006. He is s/p resection and has no signs of recurrence.   His current lab work is stable and I reviewed his results with him and his mother in detail. Recent CT scan in March, 2015 demonstrates no metastasis or recurrence of cancer. Alverto will return annually with CBC, CMP and chromogranin A. The patient is in agreement with the plan.  His chromogranin A levels of less than 5.  The patient will return in 1 year for labs and evaluation.

## 2016-11-11 NOTE — Progress Notes (Signed)
Patient Care Team: Susy Frizzle, MD as PCP - General (Family Medicine)  DIAGNOSIS:  Encounter Diagnosis  Name Primary?  . 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 He complains that over the past several months he has been having intermittent nausea and vomiting and lower abdominal pain. She has been having regular bowel movements. He denies any flushing sensation or diarrhea.  REVIEW OF SYSTEMS:   Constitutional: Denies fevers, chills or abnormal weight loss Eyes: Denies blurriness of vision Ears, nose, mouth, throat, and face: Denies mucositis or sore throat Respiratory: Denies cough, dyspnea or wheezes Cardiovascular: Denies palpitation, chest discomfort Gastrointestinal:  Intermittent nausea vomiting and abdominal pain Skin: Denies abnormal skin rashes Lymphatics: Denies new lymphadenopathy or easy bruising Neurological:Denies numbness, tingling or new weaknesses Behavioral/Psych: Mood is stable, no new changes  Extremities: No lower extremity edema All other systems were reviewed with the patient and are negative.  I have reviewed the past medical history, past surgical history, social history and family history with the patient and they are unchanged from previous note.  ALLERGIES:  has No Known Allergies.  MEDICATIONS:  Current Outpatient Prescriptions  Medication Sig Dispense Refill  . ACCU-CHEK AVIVA PLUS test strip USE AS DIRECTED THREE TIMES DAILY 100 each 3  . ACCU-CHEK SOFTCLIX LANCETS lancets USE TO CHECK BLOOD SUGAR FOUR TIMES DAILY 300 each 5  . blood glucose meter kit and supplies  Checks BS up to four times daily as directed. (E11.9) 1 each 0  . Insulin Glargine (LANTUS SOLOSTAR) 100 UNIT/ML Solostar Pen Inject 40 Units into the skin daily. 20 mL 5  . Insulin Pen Needle 31G X 8 MM MISC Use daily with pen 100 each 3  . metFORMIN (GLUCOPHAGE) 1000 MG tablet TAKE 1 TABLET BY MOUTH TWICE DAILY WITH MEALS 180 tablet 0  . metoCLOPramide (REGLAN) 10 MG tablet TK 1 T PO  QID.  3  . pioglitazone (ACTOS) 30 MG tablet TAKE 1 TABLET BY MOUTH DAILY 90 tablet 0  . rosuvastatin (CRESTOR) 20 MG tablet TAKE 1 TABLET BY MOUTH EVERY DAY 90 tablet 0  . rosuvastatin (CRESTOR) 20 MG tablet TAKE 1 TABLET BY MOUTH EVERY DAY 90 tablet 0   No current facility-administered medications for this visit.     PHYSICAL EXAMINATION: ECOG PERFORMANCE STATUS: 1 - Symptomatic but completely ambulatory  Vitals:   11/11/16 0826  BP: 106/70  Pulse: 83  Resp: 20  Temp: 97.9 F (36.6 C)   Filed Weights   11/11/16 0826  Weight: 245 lb 8 oz (111.4 kg)    GENERAL:alert, no distress and comfortable SKIN: skin color, texture, turgor are normal, no rashes or significant lesions EYES: normal, Conjunctiva are pink and non-injected, sclera clear OROPHARYNX:no exudate, no erythema and lips, buccal mucosa, and tongue normal  NECK: supple, thyroid normal size, non-tender, without nodularity LYMPH:  no palpable lymphadenopathy in the cervical, axillary or inguinal LUNGS: clear to auscultation and percussion with normal breathing effort HEART: regular rate & rhythm and no murmurs and no lower extremity edema ABDOMEN:Lower abdominal tenderness MUSCULOSKELETAL:no cyanosis of digits and no clubbing  NEURO: alert & oriented x 3 with fluent  speech, no focal motor/sensory deficits EXTREMITIES: No lower extremity edema  LABORATORY DATA:  I have reviewed the data as listed   Chemistry      Component Value Date/Time   NA 139 01/16/2016 1413   NA 141 05/29/2015 1025   K 3.9 01/16/2016 1413   K 3.7 05/29/2015  1025   CL 104 01/16/2016 1413   CL 100 11/02/2012 0955   CO2 25 01/16/2016 1413   CO2 27 05/29/2015 1025   BUN 4 (L) 01/16/2016 1413   BUN 6.7 (L) 05/29/2015 1025   CREATININE 0.53 (L) 01/16/2016 1413   CREATININE 0.7 05/29/2015 1025      Component Value Date/Time   CALCIUM 9.0 01/16/2016 1413   CALCIUM 9.5 05/29/2015 1025   ALKPHOS 56 01/16/2016 1413   ALKPHOS 53 05/29/2015 1025   AST 15 01/16/2016 1413   AST 24 05/29/2015 1025   ALT 14 01/16/2016 1413   ALT 18 05/29/2015 1025   BILITOT 0.5 01/16/2016 1413   BILITOT 0.53 05/29/2015 1025       Lab Results  Component Value Date   WBC 3.4 (L) 11/11/2016   HGB 13.3 11/11/2016   HCT 38.2 (L) 11/11/2016   MCV 112.3 (H) 11/11/2016   PLT 162 11/11/2016   NEUTROABS 1.1 (L) 11/11/2016    ASSESSMENT & PLAN:  Secondary neuroendocrine tumors ([place]) Neuroendocrine carcinoid tumor of the ileium that was diagnosed in 2006. He is s/p resection and has no signs of recurrence.   His current lab work is stable and I reviewed his results with him and his mother in detail. Recent CT scan in March, 2015 demonstrates no metastasis or recurrence of cancer. Sachit will return annually with CBC, CMP and chromogranin A. The patient is in agreement with the plan.  His chromogranin A levels of less than 5.  Abdominal pain: I will obtain a CT of his abdomen and pelvis for further evaluation. I sent a message to Dr. Johnny Bridge to see him for a colonoscopy. Return to clinic if there is any abnormality on the CT scan.  Otherwise, the patient will return in 1 year for labs and evaluation.  I spent 25 minutes talking to the patient of which more than half was spent in counseling and coordination of care.  No orders of the defined types were placed in this encounter.  The patient has a good understanding of the overall plan. he agrees with it. he will call with any problems that may develop before the next visit here.   Rulon Eisenmenger,  MD 11/11/16

## 2016-11-13 LAB — CHROMOGRANIN A: Chromogranin A: 1 nmol/L (ref 0–5)

## 2016-12-13 ENCOUNTER — Other Ambulatory Visit: Payer: Self-pay | Admitting: Family Medicine

## 2016-12-13 NOTE — Telephone Encounter (Signed)
Rx filled

## 2016-12-13 NOTE — Telephone Encounter (Signed)
Medication refill for one time only.  Patient needs to be seen.  Letter sent for patient to call and schedule 

## 2016-12-24 ENCOUNTER — Encounter (HOSPITAL_COMMUNITY): Payer: Self-pay

## 2016-12-24 ENCOUNTER — Ambulatory Visit (HOSPITAL_COMMUNITY)
Admission: RE | Admit: 2016-12-24 | Discharge: 2016-12-24 | Disposition: A | Discharge status: Home / Self Care | Payer: Medicare Other | Source: Ambulatory Visit | Attending: Hematology and Oncology | Admitting: Hematology and Oncology

## 2016-12-24 DIAGNOSIS — C7B8 Other secondary neuroendocrine tumors: Secondary | ICD-10-CM | POA: Insufficient documentation

## 2016-12-24 DIAGNOSIS — R109 Unspecified abdominal pain: Secondary | ICD-10-CM | POA: Diagnosis not present

## 2016-12-24 DIAGNOSIS — R935 Abnormal findings on diagnostic imaging of other abdominal regions, including retroperitoneum: Secondary | ICD-10-CM | POA: Diagnosis not present

## 2016-12-24 DIAGNOSIS — R1110 Vomiting, unspecified: Secondary | ICD-10-CM | POA: Diagnosis not present

## 2016-12-24 DIAGNOSIS — R112 Nausea with vomiting, unspecified: Secondary | ICD-10-CM | POA: Diagnosis not present

## 2016-12-24 DIAGNOSIS — I700 Atherosclerosis of aorta: Secondary | ICD-10-CM | POA: Insufficient documentation

## 2016-12-24 MED ORDER — IOPAMIDOL (ISOVUE-300) INJECTION 61%
INTRAVENOUS | Status: AC
  Administered 2016-12-24: 100 mL
  Filled 2016-12-24: qty 100

## 2016-12-25 DIAGNOSIS — K529 Noninfective gastroenteritis and colitis, unspecified: Secondary | ICD-10-CM | POA: Diagnosis not present

## 2016-12-25 DIAGNOSIS — R1110 Vomiting, unspecified: Secondary | ICD-10-CM | POA: Diagnosis not present

## 2016-12-30 ENCOUNTER — Ambulatory Visit (INDEPENDENT_AMBULATORY_CARE_PROVIDER_SITE_OTHER): Payer: Medicare Other | Admitting: Family Medicine

## 2016-12-30 ENCOUNTER — Other Ambulatory Visit: Payer: Self-pay | Admitting: Family Medicine

## 2016-12-30 ENCOUNTER — Encounter: Payer: Self-pay | Admitting: Family Medicine

## 2016-12-30 VITALS — BP 100/80 | HR 78 | Temp 98.0°F | Resp 18 | Ht 71.0 in | Wt 232.0 lb

## 2016-12-30 DIAGNOSIS — E119 Type 2 diabetes mellitus without complications: Secondary | ICD-10-CM | POA: Diagnosis not present

## 2016-12-30 DIAGNOSIS — E873 Alkalosis: Secondary | ICD-10-CM

## 2016-12-30 DIAGNOSIS — Z794 Long term (current) use of insulin: Secondary | ICD-10-CM

## 2016-12-30 DIAGNOSIS — E872 Acidosis: Secondary | ICD-10-CM | POA: Diagnosis not present

## 2016-12-30 DIAGNOSIS — R634 Abnormal weight loss: Secondary | ICD-10-CM | POA: Diagnosis not present

## 2016-12-30 NOTE — Progress Notes (Signed)
Subjective:    Patient ID: [name], male    DOB: 11-10-1965, 51 y.o.   MRN: [id number]  HPI Patient is a 51 year old white male with a history of secondary neuroendocrine tumor of the gastrointestinal tract. Referred here by his oncologist of her concerns about weight loss. 20 to the patient, his oncologist 1 him to see a gastroenterologist for colonoscopy. I last saw the patient may of 2017. Since that visit, he has lost 21 pounds by my scales. He states that he has no appetite. The smell of father food makes him extremely nauseated. As soon as he eats, he becomes full very quickly and feels like he has to throw up. His oncologist perform a CT scan of the abdomen and pelvis which revealed mild diffuse nonspecific wall thickening of the intestines but no other abnormality was seen. He also perform lab work revealed hypokalemia with potassium of 3.1 and a bicarbonate greater than 30 suggesting significant metabolic alkalosis. Patient denies any ingestion. Last saw the patient in May of last year, his hemoglobin A1c was 6.6 indicating adequate control. He is currently on metformin. He also takes insulin however he doses himself haphazardly as he sees fit and is therefore on no standard dose of insulin. He is also supposed to be taking metoclopramide for nausea and vomiting suspected gastroparesis. He is only taking metoclopramide once a day because it makes him sleepy. Past Medical History:  Diagnosis Date  . Cancer [facility]) carcinoid  . Colon cancer ([place])   . GERD (gastroesophageal reflux disease)   . Hyperlipemia   . Hypertension   . Kidney stones   . Shortness of breath    with exertion  . Type II diabetes mellitus ([place])   . UTI (lower urinary tract infection)   . Ventral hernia    Past Surgical History:  Procedure Laterality Date  . ABDOMINAL HERNIA REPAIR  01/20/2014   recurrent VHR w/mesh  . CHOLECYSTECTOMY    . COLECTOMY  07/2008   "got all the cancer out"  . HERNIA REPAIR   02/2009; 01/20/2014   IHR; VHR  . INSERTION OF MESH N/A 01/20/2014   Procedure: INSERTION OF MESH;  Surgeon: [name]. [name], MD;  Location: [place];  Service: General;  Laterality: N/A;  . [place]   "had to open me up after they busted; cut just above pubis"  . VENTRAL HERNIA REPAIR N/A 01/20/2014   Procedure: OPEN REPAIR OF A RECURRENT VENTRAL HERNIA REPAIR;  Surgeon: [name]. [name], MD;  Location: [place] [facility];  Service: General;  Laterality: N/A;   Current Outpatient Prescriptions on File Prior to Visit  Medication Sig Dispense Refill  . ACCU-CHEK AVIVA PLUS test strip USE AS DIRECTED THREE TIMES DAILY 100 each 3  . ACCU-CHEK SOFTCLIX LANCETS lancets USE TO CHECK BLOOD SUGAR FOUR TIMES DAILY 300 each 5  . blood glucose meter kit and supplies Checks BS up to four times daily as directed. (E11.9) 1 each 0  . Insulin Glargine (LANTUS SOLOSTAR) 100 UNIT/ML Solostar Pen Inject 40 Units into the skin daily. 20 mL 5  . Insulin Pen Needle 31G X 8 MM MISC Use daily with pen 100 each 3  . metFORMIN (GLUCOPHAGE) 1000 MG tablet TAKE 1 TABLET BY MOUTH TWICE DAILY WITH MEALS 60 tablet 0  . metoCLOPramide (REGLAN) 10 MG tablet TK 1 T PO  QID.  3  . pioglitazone (ACTOS) 30 MG tablet TAKE 1 TABLET BY MOUTH DAILY 30 tablet 0  .  rosuvastatin (CRESTOR) 20 MG tablet TAKE 1 TABLET BY MOUTH EVERY DAY 30 tablet 0   No current facility-administered medications on file prior to visit.    No Known Allergies Social History   Social History  . Marital status: Single    Spouse name: N/A  . Number of children: N/A  . Years of education: N/A   Occupational History  . Not on file.   Social History Main Topics  . Smoking status: Never Smoker  . Smokeless tobacco: Never Used  . Alcohol use No  . Drug use: No  . Sexual activity: Not Currently   Other Topics Concern  . Not on file   Social History Narrative  . No narrative on file   Family History  Problem Relation Age of Onset  . Cancer  Mother 23    breast cancer  . Breast cancer Mother   . Diabetes Mother   . Emphysema Father   . Diabetes Sister     Review of Systems  All other systems reviewed and are negative.      Objective:   Physical Exam  Constitutional: He is oriented to person, place, and time. He appears well-developed and well-nourished. No distress.  HENT:  Head: Normocephalic and atraumatic.  Right Ear: External ear normal.  Left Ear: External ear normal.  Nose: Nose normal.  Mouth/Throat: Oropharynx is clear and moist. No oropharyngeal exudate.  Eyes: Conjunctivae and EOM are normal. Pupils are equal, round, and reactive to light. Right eye exhibits no discharge. Left eye exhibits no discharge. No scleral icterus.  Neck: Normal range of motion. Neck supple. No JVD present. No tracheal deviation present. No thyromegaly present.  Cardiovascular: Normal rate, regular rhythm, normal heart sounds and intact distal pulses.  Exam reveals no gallop and no friction rub.   No murmur heard. Pulmonary/Chest: Effort normal and breath sounds normal. No stridor. No respiratory distress. He has no wheezes. He has no rales. He exhibits no tenderness.  Abdominal: Soft. Bowel sounds are normal. He exhibits no distension and no mass. There is no tenderness. There is no rebound and no guarding.  Musculoskeletal: Normal range of motion. He exhibits no edema or tenderness.  Lymphadenopathy:    He has no cervical adenopathy.  Neurological: He is alert and oriented to person, place, and time. He has normal reflexes. No cranial nerve deficit. He exhibits normal muscle tone. Coordination normal.  Skin: Skin is warm. No rash noted. He is not diaphoretic. No erythema. No pallor.  Psychiatric: He has a normal mood and affect. His behavior is normal. Judgment and thought content normal.  Vitals reviewed.         Assessment & Plan:  Metabolic alkalosis - Plan: Lactic Acid, Plasma, COMPLETE METABOLIC PANEL WITH GFR,  TSH  Controlled type 2 diabetes mellitus without complication, with long-term current use of insulin ([facility]) - Plan: Hemoglobin A1c  Weight loss - Plan: Ambulatory referral to Gastroenterology, [place] Gastric Emptying I certainly agree with a GI referral for colonoscopy given his history of cancer. However I'll also schedule a gastric emptying study as I'm concerned the patient may have gastroparesis particular given his long history of uncontrolled diabetes mellitus. Given his weight loss, I will also check a TSH. I will temporarily have the patient discontinue metformin due to his potential GI upset. I'll also evaluate the patient by repeating a CMP to determine if his metabolic colic alkalosis persist. I will also recheck his potassium. Causes of metabolic alkalosis include severe hypokalemia, contraction  alkalosis particularly given his poor by mouth intake.  He denies laxative abuse.  He is not taking any loop or thiazide diuretic.

## 2016-12-31 ENCOUNTER — Other Ambulatory Visit: Payer: Self-pay | Admitting: Family Medicine

## 2016-12-31 DIAGNOSIS — E876 Hypokalemia: Secondary | ICD-10-CM

## 2016-12-31 DIAGNOSIS — G4730 Sleep apnea, unspecified: Secondary | ICD-10-CM

## 2016-12-31 LAB — COMPLETE METABOLIC PANEL WITH GFR
ALBUMIN: 3.4 g/dL — AB (ref 3.6–5.1)
ALK PHOS: 59 U/L (ref 40–115)
ALT: 12 U/L (ref 9–46)
AST: 17 U/L (ref 10–40)
BILIRUBIN TOTAL: 1.2 mg/dL (ref 0.2–1.2)
BUN: 6 mg/dL — AB (ref 7–25)
CO2: 35 mmol/L — AB (ref 20–31)
Calcium: 8.6 mg/dL (ref 8.6–10.3)
Chloride: 93 mmol/L — ABNORMAL LOW (ref 98–110)
Creat: 0.56 mg/dL — ABNORMAL LOW (ref 0.60–1.35)
GFR, Est African American: >89 mL/min (ref >=60)
GFR, Est Non African American: >89 mL/min (ref >=60)
GLUCOSE: 183 mg/dL — AB (ref 70–99)
POTASSIUM: 3 mmol/L — AB (ref 3.5–5.3)
SODIUM: 139 mmol/L (ref 135–146)
TOTAL PROTEIN: 6.2 g/dL (ref 6.1–8.1)

## 2016-12-31 LAB — TSH: TSH: 1.3 mIU/L (ref 0.40–4.50)

## 2016-12-31 LAB — HEMOGLOBIN A1C
Hgb A1c MFr Bld: 6.2 % — ABNORMAL HIGH (ref <5.7)
Mean Plasma Glucose: 131 mg/dL

## 2016-12-31 MED ORDER — POTASSIUM CHLORIDE CRYS ER 20 MEQ PO TBCR: 20.0000 meq | EXTENDED_RELEASE_TABLET | Freq: Every day | ORAL | 3 refills | Status: DC

## 2017-01-01 LAB — MAGNESIUM: MAGNESIUM: 1.2 mg/dL — AB (ref 1.5–2.5)

## 2017-01-01 LAB — LACTIC ACID, PLASMA: LACTIC ACID: 41 mg/dL — AB (ref 4–16)

## 2017-01-02 ENCOUNTER — Observation Stay (HOSPITAL_COMMUNITY)
Admission: EM | Admit: 2017-01-02 | Discharge: 2017-01-04 | Disposition: A | Discharge status: Home / Self Care | Payer: Medicare Other | Attending: Internal Medicine | Admitting: Internal Medicine

## 2017-01-02 ENCOUNTER — Emergency Department (HOSPITAL_COMMUNITY): Payer: Medicare Other

## 2017-01-02 ENCOUNTER — Encounter (HOSPITAL_COMMUNITY): Payer: Self-pay | Admitting: Emergency Medicine

## 2017-01-02 DIAGNOSIS — R000 Tachycardia, unspecified: Secondary | ICD-10-CM | POA: Insufficient documentation

## 2017-01-02 DIAGNOSIS — S299XXA Unspecified injury of thorax, initial encounter: Secondary | ICD-10-CM | POA: Diagnosis not present

## 2017-01-02 DIAGNOSIS — Z79899 Other long term (current) drug therapy: Secondary | ICD-10-CM | POA: Insufficient documentation

## 2017-01-02 DIAGNOSIS — R140 Abdominal distension (gaseous): Secondary | ICD-10-CM

## 2017-01-02 DIAGNOSIS — K219 Gastro-esophageal reflux disease without esophagitis: Secondary | ICD-10-CM | POA: Diagnosis present

## 2017-01-02 DIAGNOSIS — W19XXXA Unspecified fall, initial encounter: Secondary | ICD-10-CM | POA: Diagnosis not present

## 2017-01-02 DIAGNOSIS — E876 Hypokalemia: Secondary | ICD-10-CM | POA: Diagnosis not present

## 2017-01-02 DIAGNOSIS — R112 Nausea with vomiting, unspecified: Secondary | ICD-10-CM | POA: Diagnosis present

## 2017-01-02 DIAGNOSIS — K3184 Gastroparesis: Secondary | ICD-10-CM | POA: Diagnosis present

## 2017-01-02 DIAGNOSIS — E1143 Type 2 diabetes mellitus with diabetic autonomic (poly)neuropathy: Secondary | ICD-10-CM | POA: Insufficient documentation

## 2017-01-02 DIAGNOSIS — E785 Hyperlipidemia, unspecified: Secondary | ICD-10-CM | POA: Insufficient documentation

## 2017-01-02 DIAGNOSIS — R404 Transient alteration of awareness: Secondary | ICD-10-CM | POA: Diagnosis not present

## 2017-01-02 DIAGNOSIS — E1169 Type 2 diabetes mellitus with other specified complication: Secondary | ICD-10-CM | POA: Diagnosis present

## 2017-01-02 DIAGNOSIS — S8992XA Unspecified injury of left lower leg, initial encounter: Secondary | ICD-10-CM | POA: Diagnosis not present

## 2017-01-02 DIAGNOSIS — E860 Dehydration: Secondary | ICD-10-CM | POA: Insufficient documentation

## 2017-01-02 DIAGNOSIS — R531 Weakness: Secondary | ICD-10-CM | POA: Diagnosis not present

## 2017-01-02 DIAGNOSIS — R2689 Other abnormalities of gait and mobility: Secondary | ICD-10-CM | POA: Insufficient documentation

## 2017-01-02 DIAGNOSIS — M6281 Muscle weakness (generalized): Secondary | ICD-10-CM | POA: Diagnosis not present

## 2017-01-02 DIAGNOSIS — Z794 Long term (current) use of insulin: Secondary | ICD-10-CM | POA: Insufficient documentation

## 2017-01-02 DIAGNOSIS — I10 Essential (primary) hypertension: Secondary | ICD-10-CM | POA: Diagnosis not present

## 2017-01-02 DIAGNOSIS — Z85038 Personal history of other malignant neoplasm of large intestine: Secondary | ICD-10-CM | POA: Diagnosis not present

## 2017-01-02 LAB — URINALYSIS, ROUTINE W REFLEX MICROSCOPIC
BILIRUBIN URINE: NEGATIVE
Glucose, UA: 150 mg/dL — AB
HGB URINE DIPSTICK: NEGATIVE
Ketones, ur: NEGATIVE mg/dL
Leukocytes, UA: NEGATIVE
Nitrite: NEGATIVE
PH: 7 (ref 5.0–8.0)
Protein, ur: NEGATIVE mg/dL
SPECIFIC GRAVITY, URINE: 1.008 (ref 1.005–1.030)

## 2017-01-02 LAB — CREATININE, SERUM
CREATININE: 0.6 mg/dL — AB (ref 0.61–1.24)
GFR calc non Af Amer: >60 mL/min (ref >60)

## 2017-01-02 LAB — CBC WITH DIFFERENTIAL/PLATELET
BASOS PCT: 0 %
Basophils Absolute: 0 10*3/uL (ref 0.0–0.1)
EOS ABS: 0 10*3/uL (ref 0.0–0.7)
EOS PCT: 2 %
HCT: 35.6 % — ABNORMAL LOW (ref 39.0–52.0)
Hemoglobin: 12.6 g/dL — ABNORMAL LOW (ref 13.0–17.0)
LYMPHS ABS: 1 10*3/uL (ref 0.7–4.0)
Lymphocytes Relative: 46 %
MCH: 38.4 pg — AB (ref 26.0–34.0)
MCHC: 35.4 g/dL (ref 30.0–36.0)
MCV: 108.5 fL — ABNORMAL HIGH (ref 78.0–100.0)
Monocytes Absolute: 0.2 10*3/uL (ref 0.1–1.0)
Monocytes Relative: 7 %
Neutro Abs: 1 10*3/uL — ABNORMAL LOW (ref 1.7–7.7)
Neutrophils Relative %: 45 %
PLATELETS: 125 10*3/uL — AB (ref 150–400)
RBC: 3.28 MIL/uL — AB (ref 4.22–5.81)
RDW: 13.3 % (ref 11.5–15.5)
WBC: 2.3 10*3/uL — AB (ref 4.0–10.5)

## 2017-01-02 LAB — CBC
HCT: 35.2 % — ABNORMAL LOW (ref 39.0–52.0)
Hemoglobin: 12.4 g/dL — ABNORMAL LOW (ref 13.0–17.0)
MCH: 38.2 pg — ABNORMAL HIGH (ref 26.0–34.0)
MCHC: 35.2 g/dL (ref 30.0–36.0)
MCV: 108.3 fL — ABNORMAL HIGH (ref 78.0–100.0)
PLATELETS: 127 10*3/uL — AB (ref 150–400)
RBC: 3.25 MIL/uL — AB (ref 4.22–5.81)
RDW: 13 % (ref 11.5–15.5)
WBC: 3.2 10*3/uL — AB (ref 4.0–10.5)

## 2017-01-02 LAB — BASIC METABOLIC PANEL
Anion gap: 7 (ref 5–15)
CO2: 32 mmol/L (ref 22–32)
CREATININE: 0.72 mg/dL (ref 0.61–1.24)
Calcium: 8.6 mg/dL — ABNORMAL LOW (ref 8.9–10.3)
Chloride: 97 mmol/L — ABNORMAL LOW (ref 101–111)
Glucose, Bld: 240 mg/dL — ABNORMAL HIGH (ref 65–99)
POTASSIUM: 2.4 mmol/L — AB (ref 3.5–5.1)
SODIUM: 136 mmol/L (ref 135–145)

## 2017-01-02 LAB — I-STAT TROPONIN, ED: Troponin i, poc: 0 ng/mL (ref 0.00–0.08)

## 2017-01-02 LAB — GLUCOSE, CAPILLARY
Glucose-Capillary: 180 mg/dL — ABNORMAL HIGH (ref 65–99)
Glucose-Capillary: 165 mg/dL — ABNORMAL HIGH (ref 65–99)

## 2017-01-02 LAB — MAGNESIUM: MAGNESIUM: 1.4 mg/dL — AB (ref 1.7–2.4)

## 2017-01-02 LAB — PHOSPHORUS: PHOSPHORUS: 2.4 mg/dL — AB (ref 2.5–4.6)

## 2017-01-02 MED ORDER — HYDRALAZINE HCL 20 MG/ML IJ SOLN: 10.0000 mg | Freq: Four times a day (QID) | INTRAMUSCULAR | Status: DC | PRN

## 2017-01-02 MED ORDER — POTASSIUM CHLORIDE 10 MEQ/100ML IV SOLN
10.0000 meq | INTRAVENOUS | Status: AC
  Administered 2017-01-02 (×2): 10 meq via INTRAVENOUS
  Filled 2017-01-02 (×3): qty 100

## 2017-01-02 MED ORDER — ONDANSETRON HCL 4 MG/2ML IJ SOLN: 4.0000 mg | Freq: Four times a day (QID) | INTRAMUSCULAR | Status: DC | PRN

## 2017-01-02 MED ORDER — ONDANSETRON HCL 4 MG PO TABS: 4.0000 mg | ORAL_TABLET | Freq: Four times a day (QID) | ORAL | Status: DC | PRN

## 2017-01-02 MED ORDER — INSULIN ASPART 100 UNIT/ML ~~LOC~~ SOLN
0.0000 [IU] | Freq: Three times a day (TID) | SUBCUTANEOUS | Status: DC
  Administered 2017-01-02: 2 [IU] via SUBCUTANEOUS
  Administered 2017-01-03: 1 [IU] via SUBCUTANEOUS
  Administered 2017-01-03: 3 [IU] via SUBCUTANEOUS
  Administered 2017-01-03 – 2017-01-04 (×2): 1 [IU] via SUBCUTANEOUS
  Administered 2017-01-04: 5 [IU] via SUBCUTANEOUS
  Administered 2017-01-04: 1 [IU] via SUBCUTANEOUS

## 2017-01-02 MED ORDER — ENOXAPARIN SODIUM 40 MG/0.4ML ~~LOC~~ SOLN
40.0000 mg | SUBCUTANEOUS | Status: DC
  Administered 2017-01-02 – 2017-01-04 (×3): 40 mg via SUBCUTANEOUS
  Filled 2017-01-02 (×3): qty 0.4

## 2017-01-02 MED ORDER — POTASSIUM CHLORIDE 2 MEQ/ML IV SOLN
30.0000 meq | Freq: Once | INTRAVENOUS | Status: DC
  Filled 2017-01-02: qty 15

## 2017-01-02 MED ORDER — MAGNESIUM SULFATE 2 GM/50ML IV SOLN
2.0000 g | Freq: Once | INTRAVENOUS | Status: AC
Start: 2017-01-02 — End: 2017-01-02
  Administered 2017-01-02: 2 g via INTRAVENOUS
  Filled 2017-01-02: qty 50

## 2017-01-02 MED ORDER — METOCLOPRAMIDE HCL 5 MG/ML IJ SOLN
10.0000 mg | Freq: Three times a day (TID) | INTRAMUSCULAR | Status: DC
  Administered 2017-01-02 – 2017-01-04 (×7): 10 mg via INTRAVENOUS
  Filled 2017-01-02 (×8): qty 2

## 2017-01-02 MED ORDER — POTASSIUM CHLORIDE CRYS ER 20 MEQ PO TBCR
30.0000 meq | EXTENDED_RELEASE_TABLET | Freq: Two times a day (BID) | ORAL | Status: AC
  Administered 2017-01-02 – 2017-01-03 (×2): 30 meq via ORAL
  Filled 2017-01-02 (×2): qty 1

## 2017-01-02 NOTE — ED Triage Notes (Signed)
Pt here from home with c/o weakness , pt fell and c/o bilateral leg pain , pt had blood drawn early in the week and was told his K was low , cbg [phone]

## 2017-01-02 NOTE — ED Provider Notes (Signed)
[place] [facility] DEPT Provider Note   CSN: [id number] Arrival date & time: 01/02/17  1058     History   Chief Complaint Chief Complaint  Patient presents with  . Weakness    HPI [name] is a 51 y.o. male.  The history is provided by the patient and medical records.    51 y.o. M with hx of colon cancer, GERD, HLP, HTN, kidney stones, DM2, hx of UTIs, presenting to the ED for generalized weakness.  Patient states this has been ongoing for a few weeks now.  States he saw his PCP about this a few days ago and had lab work performed.  He was told his magnesium and potassium were low.  TSH was normal.  Has been having some weight loss (21 lbs over the past year) unintentionally.   States he has issues eating as he gets very nauseated when he smells food and has trouble keeping it down.  He had a CT scan of his abdomen on 12/24/16 which just showed some non-specific Wall thickening and intramural fat deposits along the loops of small bowel. This was felt to be an enteritis. There was no apparent mass or adenopathy. He has been referred back to GI, Dr. Amedeo Plenty, who is seen in the past. He is scheduled for a colonoscopy as well as a swallow study. He was also scheduled for a sleep study. He had labs done which showed low potassium.  States he was started on supplements for this.  Not currently on diuretics.  His primary care doctor did discontinue his metformin as he felt this may be contributing to some of his GI symptoms.  He was switched to Actos. Patient reports today he bent down to try to get a drink but when he squatted down he could not get back up because he felt too weak. He did fall onto his knees. No head injury or loss of consciousness. He has not had any recent chest pain or shortness of breath.    Past Medical History:  Diagnosis Date  . Cancer [facility]) carcinoid  . Colon cancer ([place])   . GERD (gastroesophageal reflux disease)   . Hyperlipemia   . Hypertension   . Kidney stones   .  Shortness of breath    with exertion  . Type II diabetes mellitus ([place])   . UTI (lower urinary tract infection)   . Ventral hernia     Patient Active Problem List   Diagnosis Date Noted  . Seroma complicating a procedure 02/21/2014  . Recurrent ventral incisional hernia 01/20/2014  . Gastroparesis 05/27/2012  . Nausea with vomiting 03/25/2012  . History of malignant carcinoid tumor 03/25/2012  . FINGER SPRAIN 03/19/2010  . GERD 09/07/2009  . ANKLE INJURY, LEFT 05/31/2009  . ABDOMINAL PAIN 05/19/2009  . HEMATURIA, HX OF 05/19/2009  . VENTRAL HERNIA, INCISIONAL 12/23/2008  . URINARY TRACT INFECTION, ACUTE 10/21/2008  . TINEA PEDIS 09/14/2008  . Secondary neuroendocrine tumors ([place]) 07/22/2008  . ABRASION 03/16/2008  . DIABETES MELLITUS, TYPE II 05/18/2007  . HYPERLIPIDEMIA 05/18/2007  . ANGER 05/18/2007  . HYPERTENSION 05/18/2007    Past Surgical History:  Procedure Laterality Date  . ABDOMINAL HERNIA REPAIR  01/20/2014   recurrent VHR w/mesh  . CHOLECYSTECTOMY    . COLECTOMY  07/2008   "got all the cancer out"  . HERNIA REPAIR  02/2009; 01/20/2014   IHR; VHR  . INSERTION OF MESH N/A 01/20/2014   Procedure: INSERTION OF MESH;  Surgeon: [name]. Tsuei,  MD;  Location: [place];  Service: General;  Laterality: N/A;  . [place]   "had to open me up after they busted; cut just above pubis"  . VENTRAL HERNIA REPAIR N/A 01/20/2014   Procedure: OPEN REPAIR OF A RECURRENT VENTRAL HERNIA REPAIR;  Surgeon: [name]. [name], MD;  Location: [place] OR;  Service: General;  Laterality: N/A;       Home Medications    Prior to Admission medications   Medication Sig Start Date End Date Taking? Authorizing Provider  ACCU-CHEK AVIVA PLUS test strip USE AS DIRECTED THREE TIMES DAILY 10/22/13   Susy Frizzle, MD  ACCU-CHEK SOFTCLIX LANCETS lancets USE TO CHECK BLOOD SUGAR FOUR TIMES DAILY 06/19/16   Susy Frizzle, MD  blood glucose meter kit and supplies Checks BS up to  four times daily as directed. (E11.9) 01/18/16   Susy Frizzle, MD  Insulin Glargine (LANTUS SOLOSTAR) 100 UNIT/ML Solostar Pen Inject 40 Units into the skin daily. 06/27/16   Susy Frizzle, MD  Insulin Pen Needle 31G X 8 MM MISC Use daily with pen 06/27/16   Susy Frizzle, MD  metoCLOPramide (REGLAN) 10 MG tablet TK 1 T PO  QID. 12/18/15   Historical Provider, MD  pioglitazone (ACTOS) 30 MG tablet TAKE 1 TABLET BY MOUTH DAILY 12/13/16   Susy Frizzle, MD  potassium chloride SA (K-DUR,KLOR-CON) 20 MEQ tablet Take 1 tablet (20 mEq total) by mouth daily. 12/31/16   Susy Frizzle, MD  rosuvastatin (CRESTOR) 20 MG tablet TAKE 1 TABLET BY MOUTH EVERY DAY 12/13/16   Susy Frizzle, MD    Family History Family History  Problem Relation Age of Onset  . Cancer Mother 57    breast cancer  . Breast cancer Mother   . Diabetes Mother   . Emphysema Father   . Diabetes Sister     Social History Social History  Substance Use Topics  . Smoking status: Never Smoker  . Smokeless tobacco: Never Used  . Alcohol use No     Allergies   Patient has no known allergies.   Review of Systems Review of Systems  Musculoskeletal: Positive for arthralgias.  Neurological: Positive for weakness.  All other systems reviewed and are negative.    Physical Exam Updated Vital Signs BP 105/73   Pulse 98   Temp 97.7 F (36.5 C) (Oral)   Resp 17   Ht _0  (1.803 m)   SpO2 95%   Physical Exam  Constitutional: He is oriented to person, place, and time. He appears well-developed and well-nourished.  HENT:  Head: Normocephalic and atraumatic.  Mouth/Throat: Oropharynx is clear and moist.  Eyes: Conjunctivae and EOM are normal. Pupils are equal, round, and reactive to light.  Neck: Normal range of motion.  Cardiovascular: Normal rate, regular rhythm and normal heart sounds.   Pulmonary/Chest: Effort normal and breath sounds normal. No respiratory distress. He has no wheezes.  Abdominal:  Soft. Bowel sounds are normal. There is no tenderness. There is no rebound.  Abdomen is obese but soft and nontender  Musculoskeletal: Normal range of motion.  Abrasions noted to bilateral knees, no significant swelling or bony deformities, no open wounds or lacerations, full flexion and extension maintained, reports pain with weightbearing  Neurological: He is alert and oriented to person, place, and time.  AAOx3, answering questions and following commands appropriately; equal strength UE and LE bilaterally; CN grossly intact; moves all extremities appropriately without ataxia; no focal  neuro deficits or facial asymmetry appreciated  Skin: Skin is warm and dry.  Psychiatric: He has a normal mood and affect.  Nursing note and vitals reviewed.    ED Treatments / Results  Labs (all labs ordered are listed, but only abnormal results are displayed) Labs Reviewed  CBC WITH DIFFERENTIAL/PLATELET - Abnormal; Notable for the following:       Result Value   WBC 2.3 (*)    RBC 3.28 (*)    Hemoglobin 12.6 (*)    HCT 35.6 (*)    MCV 108.5 (*)    MCH 38.4 (*)    Platelets 125 (*)    Neutro Abs 1.0 (*)    All other components within normal limits  BASIC METABOLIC PANEL - Abnormal; Notable for the following:    Potassium 2.4 (*)    Chloride 97 (*)    Glucose, Bld 240 (*)    BUN <5 (*)    Calcium 8.6 (*)    All other components within normal limits  MAGNESIUM - Abnormal; Notable for the following:    Magnesium 1.4 (*)    All other components within normal limits  URINALYSIS, ROUTINE W REFLEX MICROSCOPIC  I-STAT TROPOININ, ED    EKG  EKG Interpretation None       Radiology Dg Chest 2 View  Result Date: 01/02/2017 CLINICAL DATA:  Fall, weakness. EXAM: CHEST  2 VIEW COMPARISON:  01/13/2014 FINDINGS: Heart and mediastinal contours are within normal limits. No focal opacities or effusions. No acute bony abnormality. IMPRESSION: No active cardiopulmonary disease. Electronically Signed    By: Rolm Baptise [name].   On: 01/02/2017 12:31   Dg Knee Complete 4 Views Left  Result Date: 01/02/2017 CLINICAL DATA:  Fall onto knees today.  Anterior brace shins. EXAM: LEFT KNEE - COMPLETE 4+ VIEW COMPARISON:  None. FINDINGS: No acute bony abnormality. Specifically, no fracture, subluxation, or dislocation. Soft tissues are intact. No joint effusion. IMPRESSION: No acute bony abnormality. Electronically Signed   By: Rolm Baptise [name].   On: 01/02/2017 12:31   Dg Knee Complete 4 Views Right  Result Date: 01/02/2017 CLINICAL DATA:  Weakness, fall today. EXAM: RIGHT KNEE - COMPLETE 4+ VIEW COMPARISON:  None. FINDINGS: No acute bony abnormality. Specifically, no fracture, subluxation, or dislocation. Soft tissues are intact. No joint effusion. IMPRESSION: No acute bony abnormality. Electronically Signed   By: Rolm Baptise [name].   On: 01/02/2017 12:30    Procedures Procedures (including critical care time)  Medications Ordered in ED Medications  magnesium sulfate IVPB 2 g 50 mL (2 g Intravenous New Bag/Given 01/02/17 1328)  potassium chloride 10 mEq in 100 mL IVPB (not administered)     Initial Impression / Assessment and Plan / ED Course  I have reviewed the triage vital signs and the nursing notes.  Pertinent labs & imaging results that were available during my care of the patient were reviewed by me and considered in my medical decision making (see chart for details).  51 year old male here with generalized weakness. Saw PCP a few days ago for the same. Has been ongoing for a few weeks. Had a fall today, landed on both knees. No syncopal events or loss of consciousness. No head trauma.  He denies any chest pain or shortness of breath. On lab work at PCP had a noted hypokalemia will start on supplementation. He is not currently on diuretics. Continues to have some emesis which is been ongoing for a while now. Negative CT scan earlier  this month. Has been scheduled for repeat colonoscopy and  gastric emptying study given his history of colon cancer. We'll plan for repeat labs, chest x-ray, x-rays of the knees, EKG.  EKG without noted ischemia. Lab work as above. He has noted hypokalemia at 2.4 as well as 1.4. He also has a noted pancytopenia. Has not had any active bleeding. Potassium a few days ago was 3.0. X-rays negative.  Given that this continued lowering despite oral supplementation, will admit for IV infusions of potassium and magnesium. This was discussed with patient and his family at the bedside, they acknowledged understanding and agreed with admission.  Final Clinical Impressions(s) / ED Diagnoses   Final diagnoses:  Weakness  Hypokalemia  Hypomagnesemia  Fall, initial encounter    New Prescriptions New Prescriptions   No medications on file     Kathryne Hitch 01/02/17 1403    Tanna Furry, MD 01/11/17 [phone]

## 2017-01-02 NOTE — ED Notes (Signed)
Report attempted x 1

## 2017-01-02 NOTE — H&P (Signed)
History and Physical    [name] EHM:[id number] DOB: 1966/03/13 DOA: 01/02/2017  PCP: Odette Fraction, MD Patient coming from:home   Chief Complaint: nausea, vomiting, weakness  HPI: [name] is a 51 y.o. 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 Patient reported that he speak symptoms have been present for a few weeks. He said that today he felt more weak than usual, he went to get a drink and couldn't get out of the car, fell on his knees could not stand back on his feet because he felt too weak. His parents called EMS and patient was transported to the ED He denied chest pain, shortness of breath but does complain of nausea and vomiting and some abdominal discomfort, fullness. He is unable to keep anything down except of Sprite and it has been going for ew days   He was seen by his primary care physician a few days ago and had a blood work revealing low potassium and low magnesium. Patient was prescribed potassium and magnesium and said that he was taking good as directed. His metformin was discontinued as it felt like this might be contributing to some of his GI symptoms and he was started on Actos   He was seen by Dr. Amedeo Plenty, gastroenterologist for did an extensive workup for his recent weight loss and loss of appetite and felt like patient had enteritis and he was scheduled for colonoscopy as well as swallow studies.   ED Course: On arrival patient was afebrile, his vital signs were stable except mild tachycardia with heart rate 109 and respirations of 21, but repeat vital signs were normal. Blood work demonstrated hypo-kalemia with potassium 2.4, hypomagnesemia-1.4 hypocalcemia with calcium 8.6, leukopenia with white  blood cells count 2300, hemoglobin was 12.6, platelet count 125,000. Troponin was 0.00 EKG showed sinus tachycardia without acute ST-T wave changes Chest x-ray didn't show any acute cardiopulmonary process X-rays of the knee bilaterally didn't show any acute bony abnormality  Review of Systems: As per HPI otherwise 10 point review of systems negative.   Ambulatory Status: Independent  Past Medical History:  Diagnosis Date  . Cancer [facility]) carcinoid  . Colon cancer ([place])   . GERD (gastroesophageal reflux disease)   . Hyperlipemia   . Hypertension   . Kidney stones   . Shortness of breath    with exertion  . Type II diabetes mellitus ([place])   . UTI (lower urinary tract infection)   . Ventral hernia     Past Surgical History:  Procedure Laterality Date  . ABDOMINAL HERNIA REPAIR  01/20/2014   recurrent VHR w/mesh  . CHOLECYSTECTOMY    . COLECTOMY  07/2008   "got all the cancer out"  . HERNIA REPAIR  02/2009; 01/20/2014   IHR; VHR  . INSERTION OF MESH N/A 01/20/2014   Procedure: INSERTION OF MESH;  Surgeon: [name]. [name], MD;  Location: [facility];  Service: General;  Laterality: N/A;  . [place]   "had to open me up after they busted; cut just above pubis"  . VENTRAL HERNIA REPAIR N/A 01/20/2014   Procedure: OPEN REPAIR OF A RECURRENT VENTRAL HERNIA REPAIR;  Surgeon: [name]. [name], MD;  Location: [place] OR;  Service: General;  Laterality: N/A;    Social History   Social History  .  Marital status: Single    Spouse name: N/A  . Number of children: N/A  . Years of education: N/A   Occupational History  . Not on file.   Social History Main Topics  . Smoking status: Never Smoker  . Smokeless tobacco: Never Used  . Alcohol use No  . Drug use: No  . Sexual activity: Not Currently   Other Topics Concern  . Not on file   Social History Narrative  . No narrative on file    No Known Allergies  Family History  Problem Relation Age of Onset  . Cancer  Mother 33    breast cancer  . Breast cancer Mother   . Diabetes Mother   . Emphysema Father   . Diabetes Sister     Prior to Admission medications   Medication Sig Start Date End Date Taking? Authorizing Provider  Insulin Glargine (LANTUS SOLOSTAR) 100 UNIT/ML Solostar Pen Inject 40 Units into the skin daily. 06/27/16  Yes Susy Frizzle, MD  Insulin Pen Needle 31G X 8 MM MISC Use daily with pen 06/27/16  Yes Susy Frizzle, MD  metoCLOPramide (REGLAN) 10 MG tablet TK 1 T PO  QID. 12/18/15  Yes Historical Provider, MD  pioglitazone (ACTOS) 30 MG tablet TAKE 1 TABLET BY MOUTH DAILY 12/13/16  Yes Susy Frizzle, MD  potassium chloride SA (K-DUR,KLOR-CON) 20 MEQ tablet Take 1 tablet (20 mEq total) by mouth daily. 12/31/16  Yes Susy Frizzle, MD  rosuvastatin (CRESTOR) 20 MG tablet TAKE 1 TABLET BY MOUTH EVERY DAY 12/13/16  Yes Susy Frizzle, MD  ACCU-CHEK AVIVA PLUS test strip USE AS DIRECTED THREE TIMES DAILY Patient not taking: Reported on 01/02/2017 10/22/13   Susy Frizzle, MD  ACCU-CHEK SOFTCLIX LANCETS lancets USE TO CHECK BLOOD SUGAR FOUR TIMES DAILY Patient not taking: Reported on 01/02/2017 06/19/16   Susy Frizzle, MD  blood glucose meter kit and supplies Checks BS up to four times daily as directed. (E11.9) Patient not taking: Reported on 01/02/2017 01/18/16   Susy Frizzle, MD    Physical Exam: Vitals:   01/02/17 1330 01/02/17 1400 01/02/17 1430 01/02/17 1500  BP: 117/89 122/68 118/86 114/85  Pulse: 99 99 96 94  Resp: 15 16 (!) 21 18  Temp:      TempSrc:      SpO2: 96% 95% 94% 95%  Height:         General: Appears calm and comfortable Eyes: PERRLA, EOMI, normal lids, iris ENT:  grossly normal hearing, lips & tongue, mucous membranes moist and intact Neck: no lymphoadenopathy, masses or thyromegaly Cardiovascular: RRR, no m/r/g. No JVD, carotid bruits. No LE edema.  Respiratory: bilateral no wheezes, rales, rhonchi or cracles. Normal respiratory effort. No  accessory muscle use observed Abdomen: soft, non-tender,slightly distended, no organomegaly or masses appreciated. Diminished BS present in all quadrants Skin: no rash, ulcers or induration seen on limited exam Musculoskeletal: grossly normal tone BUE/BLE, good ROM, no bony abnormality or joint deformities observed Psychiatric: grossly normal mood and affect, speech fluent and appropriate, alert and oriented x3 Neurologic: CN II-XII grossly intact, moves all extremities in coordinated fashion, sensation intact  Labs on Admission: I have personally reviewed following labs and imaging studies  CBC, BMP  GFR: Estimated Creatinine Clearance: 139.4 mL/min (by C-G formula based on SCr of 0.72 mg/dL).   Creatinine Clearance: Estimated Creatinine Clearance: 139.4 mL/min (by C-G formula based on SCr of 0.72 mg/dL).    Radiological Exams on Admission:  Dg Chest 2 View  Result Date: 01/02/2017 CLINICAL DATA:  Fall, weakness. EXAM: CHEST  2 VIEW COMPARISON:  01/13/2014 FINDINGS: Heart and mediastinal contours are within normal limits. No focal opacities or effusions. No acute bony abnormality. IMPRESSION: No active cardiopulmonary disease. Electronically Signed   By: Rolm Baptise [name].   On: 01/02/2017 12:31   Dg Knee Complete 4 Views Left  Result Date: 01/02/2017 CLINICAL DATA:  Fall onto knees today.  Anterior brace shins. EXAM: LEFT KNEE - COMPLETE 4+ VIEW COMPARISON:  None. FINDINGS: No acute bony abnormality. Specifically, no fracture, subluxation, or dislocation. Soft tissues are intact. No joint effusion. IMPRESSION: No acute bony abnormality. Electronically Signed   By: Rolm Baptise [name].   On: 01/02/2017 12:31   Dg Knee Complete 4 Views Right  Result Date: 01/02/2017 CLINICAL DATA:  Weakness, fall today. EXAM: RIGHT KNEE - COMPLETE 4+ VIEW COMPARISON:  None. FINDINGS: No acute bony abnormality. Specifically, no fracture, subluxation, or dislocation. Soft tissues are intact. No joint  effusion. IMPRESSION: No acute bony abnormality. Electronically Signed   By: Rolm Baptise [name].   On: 01/02/2017 12:30    EKG: Independently reviewed - sinus tachycardia without acute ST-T wave changes Assessment/Plan Principal Problem:   Weakness Active Problems:   Type 2 diabetes mellitus with hyperlipidemia (HCC)   Essential hypertension   GERD   Nausea and vomiting   Gastroparesis   Hypokalemia   Hypomagnesemia    Generalized weakness - could be associated with electrolyte imbalance-hypokalemia and hypomagnesemia Patient is on IV replacement of potassium and magnesium Continue with oral supplementation, recheck level next morning  Nausea and vomiting most likely associated with gastroparesis Initiate Reglan IV  DM type II with hyperlipidemia Hold Actos Start SSI, monitor CBG's Will hold Lipitor as patient is unable to keep food down Restart Lipitor when able to tolerate diet  HTN - BP is currently controlled. He is not on any antihypertensive medications at home Will add Hydralazine prn  DVT prophylaxis: Lovenox Code Status: full Family Communication: none Disposition Plan: Teelemetry Consults called: none Admission status: observation   York Grice, [place] Pager: [phone] [facility]  If 7PM-7AM, please contact night-coverage [email or web address] Password [facility]  01/02/2017, 4:09 PM

## 2017-01-03 ENCOUNTER — Observation Stay (HOSPITAL_COMMUNITY): Payer: Medicare Other

## 2017-01-03 DIAGNOSIS — R531 Weakness: Secondary | ICD-10-CM | POA: Diagnosis not present

## 2017-01-03 DIAGNOSIS — R140 Abdominal distension (gaseous): Secondary | ICD-10-CM | POA: Diagnosis not present

## 2017-01-03 LAB — GLUCOSE, CAPILLARY
GLUCOSE-CAPILLARY: 137 mg/dL — AB (ref 65–99)
GLUCOSE-CAPILLARY: 201 mg/dL — AB (ref 65–99)
Glucose-Capillary: 129 mg/dL — ABNORMAL HIGH (ref 65–99)
Glucose-Capillary: 197 mg/dL — ABNORMAL HIGH (ref 65–99)

## 2017-01-03 LAB — BASIC METABOLIC PANEL
ANION GAP: 8 (ref 5–15)
BUN: <5 mg/dL — ABNORMAL LOW (ref 6–20)
CO2: 32 mmol/L (ref 22–32)
Calcium: 8.1 mg/dL — ABNORMAL LOW (ref 8.9–10.3)
Chloride: 98 mmol/L — ABNORMAL LOW (ref 101–111)
Creatinine, Ser: 0.51 mg/dL — ABNORMAL LOW (ref 0.61–1.24)
GLUCOSE: 132 mg/dL — AB (ref 65–99)
POTASSIUM: 2.4 mmol/L — AB (ref 3.5–5.1)
Sodium: 138 mmol/L (ref 135–145)

## 2017-01-03 LAB — PHOSPHORUS: PHOSPHORUS: 3.5 mg/dL (ref 2.5–4.6)

## 2017-01-03 LAB — MAGNESIUM: MAGNESIUM: 1.5 mg/dL — AB (ref 1.7–2.4)

## 2017-01-03 LAB — HIV ANTIBODY (ROUTINE TESTING W REFLEX): HIV SCREEN 4TH GENERATION: NONREACTIVE

## 2017-01-03 MED ORDER — SODIUM CHLORIDE 0.9 % IV SOLN
30.0000 meq | Freq: Once | INTRAVENOUS | Status: DC
  Filled 2017-01-03: qty 15

## 2017-01-03 MED ORDER — MAGNESIUM SULFATE 2 GM/50ML IV SOLN
2.0000 g | Freq: Once | INTRAVENOUS | Status: AC
  Administered 2017-01-03: 2 g via INTRAVENOUS
  Filled 2017-01-03: qty 50

## 2017-01-03 MED ORDER — SODIUM CHLORIDE 0.9 % IV SOLN
30.0000 meq | Freq: Once | INTRAVENOUS | Status: AC
  Administered 2017-01-04: 30 meq via INTRAVENOUS
  Filled 2017-01-03: qty 15

## 2017-01-03 MED ORDER — POTASSIUM PHOSPHATE MONOBASIC 500 MG PO TABS
500.0000 mg | ORAL_TABLET | Freq: Once | ORAL | Status: AC
  Administered 2017-01-03: 500 mg via ORAL
  Filled 2017-01-03: qty 1

## 2017-01-03 MED ORDER — SODIUM CHLORIDE 0.9 % IV SOLN
30.0000 meq | Freq: Once | INTRAVENOUS | Status: AC
  Administered 2017-01-03: 30 meq via INTRAVENOUS
  Filled 2017-01-03: qty 15

## 2017-01-03 MED ORDER — GLUCERNA SHAKE PO LIQD
237.0000 mL | Freq: Three times a day (TID) | ORAL | Status: DC
  Administered 2017-01-03 (×2): 237 mL via ORAL

## 2017-01-03 MED ORDER — ADULT MULTIVITAMIN W/MINERALS CH
1.0000 | ORAL_TABLET | Freq: Every day | ORAL | Status: DC
  Administered 2017-01-03 – 2017-01-04 (×2): 1 via ORAL
  Filled 2017-01-03 (×2): qty 1

## 2017-01-03 NOTE — Progress Notes (Signed)
CRITICAL VALUE ALERT  Critical value received:  2.4 potassium  Date of notification:  01/03/17 Time of notification: 0550  Critical value read back: yes  Nurse who received alert:  Lindie Spruce  MD notified (1st page): Time of first page: 0555 MD notified (2nd page):  Time of second page:  Responding MD:    Time MD responded:

## 2017-01-03 NOTE — Care Management Obs Status (Signed)
[place] NOTIFICATION   Patient Details  Name: [name] MRN: [id number] Date of Birth: 25-May-1966   Medicare Observation Status Notification Given:  Yes    Sharin Mons, RN 01/03/2017, 6:15 PM

## 2017-01-03 NOTE — Evaluation (Signed)
Physical Therapy Evaluation Patient Details Name: [name] MRN: [id number] DOB: 05-04-1966 Today's Date: 01/03/2017   History of Present Illness  Patient is a 51 yo male admitted 01/02/17 with weakness, N/V, dehydration, abnormal labs, fall.     PMH:  DM, gastroparesis, obesity, HTN, HLD  Clinical Impression  Patient presents with problems listed below.  Will benefit from acute PT to maximize functional independence prior to return home with family.  Patient with decreased strength and balance, impacting mobility/gait.  Recommend HHPT at d/c for continued therapy.    Follow Up Recommendations Home health PT;Supervision for mobility/OOB    Equipment Recommendations  Other (comment) (Undetermined)    Recommendations for Other Services       Precautions / Restrictions Precautions Precautions: Fall Restrictions Weight Bearing Restrictions: No      Mobility  Bed Mobility Overal bed mobility: Needs Assistance Bed Mobility: Supine to Sit;Sit to Supine     Supine to sit: Mod assist Sit to supine: Min guard   General bed mobility comments: Patient required mod assist to raise trunk to sitting position.  Once upright, patient with good balance.  Transfers Overall transfer level: Needs assistance Equipment used: None Transfers: Sit to/from Stand Sit to Stand: Min guard         General transfer comment: Assist for safety.  Ambulation/Gait Ambulation/Gait assistance: Min assist Ambulation Distance (Feet): 70 Feet Assistive device: None Gait Pattern/deviations: Step-through pattern;Decreased stride length;Shuffle;Drifts right/left Gait velocity: decreased Gait velocity interpretation: Below normal speed for age/gender General Gait Details: Patient with slow, slightly unsteady gait.  No overt LOB during gait.  Stairs            Wheelchair Mobility    Modified Rankin (Stroke Patients Only)       Balance Overall balance assessment: Needs  assistance Sitting-balance support: No upper extremity supported;Feet supported Sitting balance-Leahy Scale: Good     Standing balance support: No upper extremity supported Standing balance-Leahy Scale: Fair                               Pertinent Vitals/Pain Pain Assessment: No/denies pain    Home Living Family/patient expects to be discharged to:: Private residence Living Arrangements: Parent Available Help at Discharge: Family;Available 24 hours/day Type of Home: Mobile home Home Access: Stairs to enter Entrance Stairs-Rails: Right Entrance Stairs-Number of Steps: 1 Home Layout: One level Home Equipment: None      Prior Function Level of Independence: Independent         Comments: Does not drive.     Hand Dominance        Extremity/Trunk Assessment   Upper Extremity Assessment Upper Extremity Assessment: Generalized weakness    Lower Extremity Assessment Lower Extremity Assessment: Generalized weakness       Communication   Communication: No difficulties  Cognition Arousal/Alertness: Awake/alert Behavior During Therapy: WFL for tasks assessed/performed;Flat affect Overall Cognitive Status: Within Functional Limits for tasks assessed                                        General Comments      Exercises     Assessment/Plan    PT Assessment Patient needs continued PT services  PT Problem List Decreased strength;Decreased activity tolerance;Decreased balance;Decreased mobility;Decreased knowledge of use of DME;Obesity       PT Treatment Interventions DME instruction;Gait  training;Stair training;Functional mobility training;Therapeutic activities;Therapeutic exercise;Balance training;Patient/family education    PT Goals (Current goals can be found in the Care Plan section)  Acute Rehab PT Goals Patient Stated Goal: To get stronger PT Goal Formulation: With patient Time For Goal Achievement: 01/10/17 Potential to  Achieve Goals: Good    Frequency Min 3X/week   Barriers to discharge        Co-evaluation               End of Session Equipment Utilized During Treatment: Gait belt Activity Tolerance: Patient limited by fatigue Patient left: in bed;with call bell/phone within reach;with bed alarm set;with family/visitor present Nurse Communication: Mobility status PT Visit Diagnosis: Unsteadiness on feet (R26.81);Other abnormalities of gait and mobility (R26.89);Muscle weakness (generalized) (M62.81)    Time: 4935-5217 PT Time Calculation (min) (ACUTE ONLY): 13 min   Charges:   PT Evaluation $PT Eval Moderate Complexity: 1 Procedure     PT G Codes:   PT G-Codes **NOT FOR INPATIENT CLASS** Functional Assessment Tool Used: AM-PAC 6 Clicks Basic Mobility;Clinical judgement Functional Limitation: Mobility: Walking and moving around Mobility: Walking and Moving Around Current Status (G7159): At least 20 percent but less than 40 percent impaired, limited or restricted Mobility: Walking and Moving Around Goal Status [phone]): At least 1 percent but less than 20 percent impaired, limited or restricted    Carita Pian. Sanjuana Kava, [facility] Acute Rehab Services Pager [phone]   Despina Pole 01/03/2017, 9:23 PM

## 2017-01-03 NOTE — Progress Notes (Signed)
[facility] PROGRESS NOTE  AUDRIC VENN TML:[id number] DOB: Oct 16, 1965 DOA: 01/02/2017 PCP: Jenna Luo TOM, MD  Assessment/Plan:  Generalized weakness Up in chair Physical Therapy consult  Electrolyte imbalances Recheck electrolytes Replace by mouth and IV as needed  N/V Reglan prn Patient having difficulty with by mouth so we'll give liquid nutrition supplements, dietitian consult, multivitamin Patient will need to complete his gastroenterology workup as an outpatient on discharge  Abd distension Checking 2vw abd XR  DM Cont SSI  HTN Prn hydralazine  Code Status: FULL Family Communication: parents at bedside (indicate person spoken with, relationship, and if by phone, the number) Disposition Plan: TBD   Consultants:  dietician  Procedures:  none  Antibiotics:  none (indicate start date, and stop date if known)  HPI/Subjective:  Patient denies any acute events overnight. Still weak. Tolerating by mouth well. Not taking in solids primarily due to taste and texture. Passing gas and bowel movements. No vomiting. Has fevers.  Objective: Vitals:   01/03/17 0505 01/03/17 1416  BP: 113/71 118/71  Pulse: (!) 106 99  Resp: 18 20  Temp: 98.8 F (37.1 C) 98.9 F (37.2 C)    Intake/Output Summary (Last 24 hours) at 01/03/17 1555 Last data filed at 01/03/17 1208  Gross per 24 hour  Intake              760 ml  Output              625 ml  Net              135 ml   There were no vitals filed for this visit.  Exam:   General:  No acute distress alert and oriented 3  Cardiovascular: Regular rate and rhythm no murmurs rubs gallops  Respiratory: Normal work of breathing, clear to obscuration bilaterally  Abdomen: Distended, right abdomen tender to palpation mildly, no guarding or rigidity  Musculoskeletal: Moving all extremities   Data Reviewed: Basic Metabolic Panel:  Recent Labs Lab 12/30/16 1451 01/02/17 1151 01/02/17 1637  01/02/17 1837 01/03/17 0616  NA 139 136  --   --   --   K 3.0* 2.4*  --   --   --   CL 93* 97*  --   --   --   CO2 35* 32  --   --   --   GLUCOSE 183* 240*  --   --   --   BUN 6* <5*  --   --   --   CREATININE 0.56* 0.72 0.60*  --   --   CALCIUM 8.6 8.6*  --   --   --   MG 1.2* 1.4*  --   --  1.5*  PHOS  --   --   --  2.4*  --    Liver Function Tests:  Recent Labs Lab 12/30/16 1451  AST 17  ALT 12  ALKPHOS 59  BILITOT 1.2  PROT 6.2  ALBUMIN 3.4*   No results for input(s): LIPASE, AMYLASE in the last 168 hours. No results for input(s): AMMONIA in the last 168 hours. CBC:  Recent Labs Lab 01/02/17 1151 01/02/17 1637  WBC 2.3* 3.2*  NEUTROABS 1.0*  --   HGB 12.6* 12.4*  HCT 35.6* 35.2*  MCV 108.5* 108.3*  PLT 125* 127*   Cardiac Enzymes: No results for input(s): CKTOTAL, CKMB, CKMBINDEX, TROPONINI in the last 168 hours. BNP (last 3 results) No results for input(s): BNP in the last 8760 hours.  ProBNP (last 3 results) No results for input(s): PROBNP in the last 8760 hours.  CBG:  Recent Labs Lab 01/02/17 1904 01/02/17 2236 01/03/17 0848 01/03/17 1206  GLUCAP 180* 165* 129* 197*    No results found for this or any previous visit (from the past 240 hour(s)).   Studies: Dg Chest 2 View  Result Date: 01/02/2017 CLINICAL DATA:  Fall, weakness. EXAM: CHEST  2 VIEW COMPARISON:  01/13/2014 FINDINGS: Heart and mediastinal contours are within normal limits. No focal opacities or effusions. No acute bony abnormality. IMPRESSION: No active cardiopulmonary disease. Electronically Signed   By: Rolm Baptise [name].   On: 01/02/2017 12:31   Dg Knee Complete 4 Views Left  Result Date: 01/02/2017 CLINICAL DATA:  Fall onto knees today.  Anterior brace shins. EXAM: LEFT KNEE - COMPLETE 4+ VIEW COMPARISON:  None. FINDINGS: No acute bony abnormality. Specifically, no fracture, subluxation, or dislocation. Soft tissues are intact. No joint effusion. IMPRESSION: No acute bony  abnormality. Electronically Signed   By: Rolm Baptise [name].   On: 01/02/2017 12:31   Dg Knee Complete 4 Views Right  Result Date: 01/02/2017 CLINICAL DATA:  Weakness, fall today. EXAM: RIGHT KNEE - COMPLETE 4+ VIEW COMPARISON:  None. FINDINGS: No acute bony abnormality. Specifically, no fracture, subluxation, or dislocation. Soft tissues are intact. No joint effusion. IMPRESSION: No acute bony abnormality. Electronically Signed   By: Rolm Baptise [name].   On: 01/02/2017 12:30    Scheduled Meds: . enoxaparin (LOVENOX) injection  40 mg Subcutaneous Q24H  . feeding supplement (GLUCERNA SHAKE)  237 mL Oral TID BM  . insulin aspart  0-9 Units Subcutaneous TID WC  . metoCLOPramide (REGLAN) injection  10 mg Intravenous Q8H  . multivitamin with minerals  1 tablet Oral Daily  . potassium phosphate (monobasic)  500 mg Oral Once   Continuous Infusions: . magnesium sulfate 1 - 4 g bolus IVPB      Principal Problem:   Weakness Active Problems:   Type 2 diabetes mellitus with hyperlipidemia (HCC)   Essential hypertension   GERD   Nausea and vomiting   Gastroparesis   Hypokalemia   Hypomagnesemia    Time spent: [facility] If 7PM-7AM, please contact night-coverage at [email or web address], password [facility] 01/03/2017, 3:55 PM  LOS: 0 days

## 2017-01-04 DIAGNOSIS — I10 Essential (primary) hypertension: Secondary | ICD-10-CM

## 2017-01-04 DIAGNOSIS — R140 Abdominal distension (gaseous): Secondary | ICD-10-CM

## 2017-01-04 DIAGNOSIS — R531 Weakness: Secondary | ICD-10-CM | POA: Diagnosis not present

## 2017-01-04 DIAGNOSIS — E876 Hypokalemia: Secondary | ICD-10-CM

## 2017-01-04 DIAGNOSIS — K3184 Gastroparesis: Secondary | ICD-10-CM | POA: Diagnosis not present

## 2017-01-04 LAB — PHOSPHORUS: Phosphorus: 3.5 mg/dL (ref 2.5–4.6)

## 2017-01-04 LAB — POTASSIUM: Potassium: 3.2 mmol/L — ABNORMAL LOW (ref 3.5–5.1)

## 2017-01-04 LAB — BASIC METABOLIC PANEL
Anion gap: 6 (ref 5–15)
CO2: 32 mmol/L (ref 22–32)
CREATININE: 0.51 mg/dL — AB (ref 0.61–1.24)
Calcium: 8.1 mg/dL — ABNORMAL LOW (ref 8.9–10.3)
Chloride: 102 mmol/L (ref 101–111)
GFR calc Af Amer: >60 mL/min (ref >60)
GFR calc non Af Amer: >60 mL/min (ref >60)
GLUCOSE: 140 mg/dL — AB (ref 65–99)
Potassium: 2.9 mmol/L — ABNORMAL LOW (ref 3.5–5.1)
SODIUM: 140 mmol/L (ref 135–145)

## 2017-01-04 LAB — GLUCOSE, CAPILLARY
GLUCOSE-CAPILLARY: 137 mg/dL — AB (ref 65–99)
Glucose-Capillary: 133 mg/dL — ABNORMAL HIGH (ref 65–99)
Glucose-Capillary: 256 mg/dL — ABNORMAL HIGH (ref 65–99)

## 2017-01-04 LAB — CALCIUM, IONIZED: Calcium, Ionized, Serum: 4.8 mg/dL (ref 4.5–5.6)

## 2017-01-04 LAB — MAGNESIUM: MAGNESIUM: 1.7 mg/dL (ref 1.7–2.4)

## 2017-01-04 MED ORDER — POTASSIUM CHLORIDE CRYS ER 20 MEQ PO TBCR: 40.0000 meq | EXTENDED_RELEASE_TABLET | Freq: Every day | ORAL | 0 refills | Status: DC

## 2017-01-04 MED ORDER — ADULT MULTIVITAMIN W/MINERALS CH: 1.0000 | ORAL_TABLET | Freq: Every day | ORAL | Status: DC

## 2017-01-04 MED ORDER — SODIUM CHLORIDE 0.9 % IV SOLN
30.0000 meq | Freq: Once | INTRAVENOUS | Status: AC
  Administered 2017-01-04: 30 meq via INTRAVENOUS
  Filled 2017-01-04: qty 15

## 2017-01-04 MED ORDER — POTASSIUM CHLORIDE CRYS ER 20 MEQ PO TBCR
40.0000 meq | EXTENDED_RELEASE_TABLET | Freq: Two times a day (BID) | ORAL | Status: DC
  Administered 2017-01-04: 40 meq via ORAL
  Filled 2017-01-04: qty 2

## 2017-01-04 MED ORDER — ONDANSETRON HCL 4 MG PO TABS: 4.0000 mg | ORAL_TABLET | Freq: Four times a day (QID) | ORAL | 0 refills | Status: DC | PRN

## 2017-01-04 MED ORDER — POTASSIUM CHLORIDE CRYS ER 20 MEQ PO TBCR
40.0000 meq | EXTENDED_RELEASE_TABLET | Freq: Once | ORAL | Status: AC
  Administered 2017-01-04: 40 meq via ORAL
  Filled 2017-01-04: qty 2

## 2017-01-04 MED ORDER — GLUCERNA SHAKE PO LIQD: 237.0000 mL | Freq: Three times a day (TID) | ORAL | 0 refills | Status: DC

## 2017-01-04 NOTE — Progress Notes (Signed)
[name] to be D/C'd Home per MD order.  Discussed with the patient and all questions fully answered.  VSS, Skin clean, dry and intact without evidence of skin break down, no evidence of skin tears noted. IV catheter discontinued intact. Site without signs and symptoms of complications. Dressing and pressure applied.  An After Visit Summary was printed and given to the patient. Patient received prescription.  D/c education completed with patient/family including follow up instructions, medication list, d/c activities limitations if indicated, with other d/c instructions as indicated by MD - patient able to verbalize understanding, all questions fully answered.   Patient instructed to return to ED, call 911, or call MD for any changes in condition.   Patient escorted via [place], and D/C home via private auto.  [name] 01/04/2017 4:59 PM

## 2017-01-04 NOTE — Progress Notes (Signed)
Physical Therapy Treatment Patient Details Name: [name] MRN: [id number] DOB: 1966-04-02 Today's Date: 01/04/2017    History of Present Illness Patient is a 51 yo male admitted 01/02/17 with weakness, N/V, dehydration, abnormal labs, fall.     PMH:  DM, gastroparesis, obesity, HTN, HLD    PT Comments    Pt presents with improved tolerance for gait distance and reduced assistance required for bed mobility and transfers. Pt is able to sit up in recliner at the completion of this session. Pt will benefit from continued follow-up to address endurance and strength deficits.     Follow Up Recommendations  Home health PT;Supervision for mobility/OOB     Equipment Recommendations  None recommended by PT    Recommendations for Other Services       Precautions / Restrictions Precautions Precautions: Fall Restrictions Weight Bearing Restrictions: No    Mobility  Bed Mobility Overal bed mobility: Needs Assistance Bed Mobility: Supine to Sit     Supine to sit: Min guard     General bed mobility comments: Min guard this session with use of railings  Transfers Overall transfer level: Needs assistance Equipment used: None Transfers: Sit to/from Stand Sit to Stand: Min guard         General transfer comment: Assist for safety.  Ambulation/Gait Ambulation/Gait assistance: Min guard Ambulation Distance (Feet): 120 Feet Assistive device: None Gait Pattern/deviations: Step-through pattern;Decreased stride length;Shuffle;Drifts right/left Gait velocity: decreased Gait velocity interpretation: Below normal speed for age/gender General Gait Details: Patient with slow, slightly unsteady gait.  No overt LOB during gait.   Stairs            Wheelchair Mobility    Modified Rankin (Stroke Patients Only)       Balance Overall balance assessment: Needs assistance Sitting-balance support: No upper extremity supported;Feet supported Sitting balance-Leahy Scale:  Good     Standing balance support: No upper extremity supported Standing balance-Leahy Scale: Fair                              Cognition Arousal/Alertness: Awake/alert Behavior During Therapy: WFL for tasks assessed/performed;Flat affect Overall Cognitive Status: Within Functional Limits for tasks assessed                                        Exercises      General Comments        Pertinent Vitals/Pain Pain Assessment: No/denies pain    Home Living                      Prior Function            PT Goals (current goals can now be found in the care plan section) Acute Rehab PT Goals Patient Stated Goal: To get stronger Progress towards PT goals: Progressing toward goals    Frequency    Min 3X/week      PT Plan Current plan remains appropriate    Co-evaluation             End of Session Equipment Utilized During Treatment: Gait belt Activity Tolerance: Patient tolerated treatment well Patient left: in chair;with call bell/phone within reach Nurse Communication: Mobility status PT Visit Diagnosis: Unsteadiness on feet (R26.81);Other abnormalities of gait and mobility (R26.89);Muscle weakness (generalized) (M62.81)     Time: 3235-5732 PT Time Calculation (min) (  ACUTE ONLY): 18 min  Charges:  $Gait Training: 8-22 mins                    G Codes:       Scheryl Marten PT, DPT  [phone]   Jacqulyn Liner Sloan Leiter 01/04/2017, 1:30 PM

## 2017-01-04 NOTE — Discharge Summary (Signed)
Physician Discharge Summary  [name] XNA:[id number] DOB: 1966-04-03 DOA: 01/02/2017  PCP: Odette Fraction, MD  Admit date: 01/02/2017 Discharge date: 01/04/2017  Admitted From: Home.  Disposition:  Home.   Recommendations for Outpatient Follow-up:  1. Follow up with PCP in 1-2 weeks 2. Please obtain BMP/CBC in one week 3. Please follow up with gastroenterology in 2 weeks for gastroparesis.      Discharge Condition: stable.  CODE STATUS:full code.  Diet recommendation: Heart Healthy  Brief/Interim Summary: [name] is a 51 y.o. male with a Past Medical History of significant for GERD, hypertension, hyperlipidemia, diabetes who presents with weakness nausea vomiting. Diagnosis dehydration, nausea vomiting, electrolyte imbalance.  Discharge Diagnoses:  Principal Problem:   Weakness Active Problems:   Type 2 diabetes mellitus with hyperlipidemia (HCC)   Essential hypertension   GERD   Nausea and vomiting   Gastroparesis   Hypokalemia   Hypomagnesemia  Nausea, vomiting and dehydration:  Possibly from gastroparesis flare up.  He was started on IV reglan and clear liquid diet.  His symptoms improved and he was recommended to follow up with his gastroenterologist on discharge.    Hypokalemia: replaced adequately.    Hypomagnesemia:  Replaced.    abd distention:  abd films normal.    Hypertension; well controlled.    Diabetes mellitus.  Resume home meds on discharge.     Discharge Instructions  Discharge Instructions    Diet Carb Modified    Complete by:  As directed    Discharge instructions    Complete by:  As directed    Please follow up with PCP in one week.     Allergies as of 01/04/2017   No Known Allergies     Medication List    STOP taking these medications   ACCU-CHEK AVIVA PLUS test strip Generic drug:  glucose blood   ACCU-CHEK SOFTCLIX LANCETS lancets   blood glucose meter kit and supplies     TAKE these medications    feeding supplement (GLUCERNA SHAKE) Liqd Take 237 mLs by mouth 3 (three) times daily between meals.   Insulin Pen Needle 31G X 8 MM Misc Use daily with pen   metoCLOPramide 10 MG tablet Commonly known as:  REGLAN TK 1 T PO  QID.   multivitamin with minerals Tabs tablet Take 1 tablet by mouth daily. Start taking on:  01/05/2017   ondansetron 4 MG tablet Commonly known as:  ZOFRAN Take 1 tablet (4 mg total) by mouth every 6 (six) hours as needed for nausea.   pioglitazone 30 MG tablet Commonly known as:  ACTOS TAKE 1 TABLET BY MOUTH DAILY   potassium chloride SA 20 MEQ tablet Commonly known as:  K-DUR,KLOR-CON Take 2 tablets (40 mEq total) by mouth daily. What changed:  how much to take   rosuvastatin 20 MG tablet Commonly known as:  CRESTOR TAKE 1 TABLET BY MOUTH EVERY DAY Notes to patient:  Take every other day       Follow-up Information    [facility] TOM, MD. Schedule an appointment as soon as possible for a visit in 1 week(s).   Specialty:  Family Medicine Contact information: [address] [place] [address] [place] Follow up.   Why:  for hh pt Contact information: [address] [place]  [address] [phone]          No Known Allergies  Consultations:  None.  Procedures/Studies: Dg Chest 2 View  Result Date: 01/02/2017 CLINICAL DATA:  Fall, weakness. EXAM: CHEST  2 VIEW COMPARISON:  01/13/2014 FINDINGS: Heart and mediastinal contours are within normal limits. No focal opacities or effusions. No acute bony abnormality. IMPRESSION: No active cardiopulmonary disease. Electronically Signed   By: Rolm Baptise [name].   On: 01/02/2017 12:31   Ct Abdomen Pelvis W Contrast  Result Date: 12/24/2016 CLINICAL DATA:  Abdominal pain.  Nausea and vomiting. EXAM: CT ABDOMEN AND PELVIS WITH CONTRAST TECHNIQUE: Multidetector CT imaging of the abdomen and pelvis was performed using the  standard protocol following bolus administration of intravenous contrast. CONTRAST:  185m ISOVUE-300 IOPAMIDOL (ISOVUE-300) INJECTION 61% COMPARISON:  11/08/2013 FINDINGS: Lower chest: The lung bases are clear.  No acute abnormality. Hepatobiliary: No focal liver abnormality. Previous cholecystectomy. No intrahepatic bile duct dilatation. Pancreas: Unremarkable. No pancreatic ductal dilatation or surrounding inflammatory changes. Spleen: Normal in size without focal abnormality. Adrenals/Urinary Tract: Normal appearance of the adrenal glands. 2.4 cm lower pole left kidney cysts noted. No kidney mass or hydronephrosis identified. Urinary bladder is unremarkable. Stomach/Bowel: Unremarkable appearance of the stomach. There is no small bowel wall dilatation identified. Mild nonspecific small bowel wall thickening is identified with intramural fatty deposition suggestive of chronic inflammation. Postsurgical change from right hemicolectomy identified. No pathologic dilatation of the mid and distal colon. Vascular/Lymphatic: Aortic atherosclerosis. No aneurysm. No enlarged upper abdominal lymph nodes. Reproductive: Prostate is unremarkable. Other: There is no ascites or focal fluid collections within the abdomen or pelvis. Musculoskeletal: Degenerative disc disease identified within the lumbar spine. No aggressive lytic or sclerotic bone lesions. IMPRESSION: 1. There is mild diffuse, nonspecific wall thickening and intramural fatty deposition involving the small bowel loops. Findings favored to represent enteritis. No complications identified. 2. No mass or adenopathy identified within the abdomen or pelvis. 3. Aortic atherosclerosis. Electronically Signed   By: TKerby MoorsM.D.   On: 12/24/2016 17:05   Dg Knee Complete 4 Views Left  Result Date: 01/02/2017 CLINICAL DATA:  Fall onto knees today.  Anterior brace shins. EXAM: LEFT KNEE - COMPLETE 4+ VIEW COMPARISON:  None. FINDINGS: No acute bony abnormality.  Specifically, no fracture, subluxation, or dislocation. Soft tissues are intact. No joint effusion. IMPRESSION: No acute bony abnormality. Electronically Signed   By: KRolm BaptiseM.D.   On: 01/02/2017 12:31   Dg Knee Complete 4 Views Right  Result Date: 01/02/2017 CLINICAL DATA:  Weakness, fall today. EXAM: RIGHT KNEE - COMPLETE 4+ VIEW COMPARISON:  None. FINDINGS: No acute bony abnormality. Specifically, no fracture, subluxation, or dislocation. Soft tissues are intact. No joint effusion. IMPRESSION: No acute bony abnormality. Electronically Signed   By: KRolm BaptiseM.D.   On: 01/02/2017 12:30   Dg Abd 2 Views  Result Date: 01/03/2017 CLINICAL DATA:  Abdominal distension. Pt's abdomen looked swollen on the left side. Pt stated he's had pain in the LLQ and LRQ, can't keep food down. Hx of colon cancer, GERD, Kidney stones, UTI (lower urinary tract infection), ventral hernia. EXAM: ABDOMEN - 2 VIEW COMPARISON:  None. FINDINGS: The bowel gas pattern is normal. There is no evidence of free air. No radio-opaque calculi or other significant radiographic abnormality is seen. IMPRESSION: Negative. Electronically Signed   By: HKathreen Devoid  On: 01/03/2017 18:15       Subjective:  No new complaints.  Discharge Exam: Vitals:   01/04/17 0535 01/04/17 1410  BP: 113/71 116/79  Pulse: 99 (!) 107  Resp: 18 18  Temp:  98.3 F (36.8 C) 98 F (36.7 C)   Vitals:   01/03/17 1416 01/03/17 2151 01/04/17 0535 01/04/17 1410  BP: 118/71 (!) 112/55 113/71 116/79  Pulse: 99 (!) 105 99 (!) 107  Resp: '20 18 18 18  ' Temp: 98.9 F (37.2 C) 98.5 F (36.9 C) 98.3 F (36.8 C) 98 F (36.7 C)  TempSrc: Oral Oral Oral Oral  SpO2: 95% 92% 95% 94%  Height:        General: Pt is alert, awake, not in acute distress Cardiovascular: RRR, S1/S2 +, no rubs, no gallops Respiratory: CTA bilaterally, no wheezing, no rhonchi Abdominal: Soft, NT, ND, bowel sounds + Extremities: no edema, no cyanosis    The results  of significant diagnostics from this hospitalization (including imaging, microbiology, ancillary and laboratory) are listed below for reference.     Microbiology: No results found for this or any previous visit (from the past 240 hour(s)).   Labs: BNP (last 3 results) No results for input(s): BNP in the last 8760 hours. Basic Metabolic Panel:  Recent Labs Lab 12/30/16 1451 01/02/17 1151 01/02/17 1637 01/02/17 1837 01/03/17 0616 01/03/17 1714 01/04/17 0427 01/04/17 1242  NA 139 136  --   --   --  138 140  --   K 3.0* 2.4*  --   --   --  2.4* 2.9* 3.2*  CL 93* 97*  --   --   --  98* 102  --   CO2 35* 32  --   --   --  32 32  --   GLUCOSE 183* 240*  --   --   --  132* 140*  --   BUN 6* <5*  --   --   --  <5* <5*  --   CREATININE 0.56* 0.72 0.60*  --   --  0.51* 0.51*  --   CALCIUM 8.6 8.6*  --   --   --  8.1* 8.1*  --   MG 1.2* 1.4*  --   --  1.5*  --  1.7  --   PHOS  --   --   --  2.4*  --  3.5 3.5  --    Liver Function Tests:  Recent Labs Lab 12/30/16 1451  AST 17  ALT 12  ALKPHOS 59  BILITOT 1.2  PROT 6.2  ALBUMIN 3.4*   No results for input(s): LIPASE, AMYLASE in the last 168 hours. No results for input(s): AMMONIA in the last 168 hours. CBC:  Recent Labs Lab 01/02/17 1151 01/02/17 1637  WBC 2.3* 3.2*  NEUTROABS 1.0*  --   HGB 12.6* 12.4*  HCT 35.6* 35.2*  MCV 108.5* 108.3*  PLT 125* 127*   Cardiac Enzymes: No results for input(s): CKTOTAL, CKMB, CKMBINDEX, TROPONINI in the last 168 hours. BNP: Invalid input(s): POCBNP CBG:  Recent Labs Lab 01/03/17 1711 01/03/17 2149 01/04/17 0800 01/04/17 1230 01/04/17 1647  GLUCAP 137* 201* 133* 137* 256*   D-Dimer No results for input(s): DDIMER in the last 72 hours. Hgb A1c No results for input(s): HGBA1C in the last 72 hours. Lipid Profile No results for input(s): CHOL, HDL, LDLCALC, TRIG, CHOLHDL, LDLDIRECT in the last 72 hours. Thyroid function studies No results for input(s): TSH, T4TOTAL,  T3FREE, THYROIDAB in the last 72 hours.  Invalid input(s): FREET3 Anemia work up No results for input(s): VITAMINB12, FOLATE, FERRITIN, TIBC, IRON, RETICCTPCT in the last 72 hours. Urinalysis    Component Value Date/Time   COLORURINE YELLOW 01/02/2017  [place] 01/02/2017 1600   LABSPEC 1.008 01/02/2017 1600   PHURINE 7.0 01/02/2017 1600   GLUCOSEU 150 (A) 01/02/2017 1600   HGBUR NEGATIVE 01/02/2017 1600   HGBUR negative 09/07/2009 0822   BILIRUBINUR NEGATIVE 01/02/2017 1600   KETONESUR NEGATIVE 01/02/2017 1600   PROTEINUR NEGATIVE 01/02/2017 1600   UROBILINOGEN 0.2 09/07/2009 0822   NITRITE NEGATIVE 01/02/2017 1600   LEUKOCYTESUR NEGATIVE 01/02/2017 1600   Sepsis Labs Invalid input(s): PROCALCITONIN,  WBC,  LACTICIDVEN Microbiology No results found for this or any previous visit (from the past 240 hour(s)).   Time coordinating discharge: Over 30 minutes  SIGNED:   Hosie Poisson, MD  [facility] 01/04/2017, 6:52 PM Pager   If 7PM-7AM, please contact night-coverage [email or web address] Password [email or web address]

## 2017-01-04 NOTE — Progress Notes (Signed)
Initial Nutrition Assessment  DOCUMENTATION CODES:   Obesity unspecified  INTERVENTION:  Continue Glucerna Shake po TID, each supplement provides 220 kcal and 10 grams of protein. Encouraged patient to continue drinking these at home to prevent further weight loss until his appetite is back to normal.   NUTRITION DIAGNOSIS:   Inadequate oral intake related to nausea, vomiting as evidenced by per patient/family report.  GOAL:   Patient will meet greater than or equal to 90% of their needs  MONITOR:   PO intake, Supplement acceptance, Labs, Weight trends, I & O's  REASON FOR ASSESSMENT:   Consult Assessment of nutrition requirement/status  ASSESSMENT:   51 year old male with PMHx of HTN, GERD, HLD, DM type 2 who presented with weakness and N/V.   -Per chart patient may have gastroparesis. Was initiated on Reglan. -Now with d/c orders. Per chart will f/u with GI outpatient.  Spoke with patient at bedside. He reports that for the past few months he has had nausea and vomiting after meals. He has attempted to eat three meals daily but after eating a few bites he will vomit. He was able to tolerate liquids better. He reports he is feeling much better now and has been able to eat meals and keep them down. He reports he is still not eating quite as much as he used to eat.   Reports he was 257 lbs last year at an MD appointment and has been losing weight. Per chart patient was 253 lbs on 01/16/2016 and has lost 21 lbs (8.3% body weight) over the past year, which is not significant for time frame. Of that weight loss, he has lost 13.5 lbs (5.4% body weight) over the past 2 months, which is also not significant for time frame.  Medications reviewed and include: Novolog sliding scale TID with meals, Reglan 10 mg Q8hrs, MVI, potassium chloride 40 mEq once today.  Labs reviewed: CBG 133-201 past 24 hrs, Potassium 3.2 (trending up from 2.4), BUN <5, Creatinine 0.51.   Nutrition-Focused  physical exam completed. Findings are no fat depletion, no muscle depletion, and no edema.   Patient does not meet criteria for malnutrition at this time.  Diet Order:  Diet Carb Modified Fluid consistency: Thin; Room service appropriate? Yes Diet Carb Modified  Skin:  Reviewed, no issues  Last BM:  01/01/2017  Height:   Ht Readings from Last 1 Encounters:  01/02/17 5\' 11"  (1.803 m)    Weight:   Wt Readings from Last 1 Encounters:  12/30/16 232 lb (105.2 kg)    Ideal Body Weight:  78.2 kg  BMI:  There is no height or weight on file to calculate BMI.  Estimated Nutritional Needs:   Kcal:  2140-2530 (MSJ x 1.1-1.3)  Protein:  105-125 grams (1-1.2 grams/kg)  Fluid:  2.1-2.5 L/day  EDUCATION NEEDS:   No education needs identified at this time  Willey Blade, MS, RD, LDN Pager: [phone] After Hours Pager: [phone]

## 2017-01-04 NOTE — Care Management Note (Addendum)
Case Management Note  Patient Details  Name: [name] MRN: [id number] Date of Birth: [birth year]  Subjective/Objective:                 Patient with order to DC to home. Spoke w patient and he would like to use [facility]. Referral made.  No other CM needs identified.  Action/Plan:  DC w HH. Expected Discharge Date:  01/04/17               Expected Discharge Plan:  [place]  In-House Referral:     Discharge planning Services  CM Consult  Post Acute Care Choice:  Home Health Choice offered to:  Patient  DME Arranged:    DME Agency:     HH Arranged:  PT [place]:  [place]  Status of Service:  Completed, signed off  If discussed at [place] of Stay Meetings, dates discussed:    Additional Comments:  Carles Collet, RN 01/04/2017, 2:27 PM

## 2017-01-06 ENCOUNTER — Encounter (HOSPITAL_COMMUNITY)
Admission: RE | Admit: 2017-01-06 | Discharge: 2017-01-06 | Disposition: A | Discharge status: Home / Self Care | Payer: Medicare Other | Source: Ambulatory Visit | Attending: Family Medicine | Admitting: Family Medicine

## 2017-01-06 DIAGNOSIS — R634 Abnormal weight loss: Secondary | ICD-10-CM | POA: Diagnosis not present

## 2017-01-06 DIAGNOSIS — R112 Nausea with vomiting, unspecified: Secondary | ICD-10-CM | POA: Diagnosis not present

## 2017-01-06 MED ORDER — TECHNETIUM TC 99M SULFUR COLLOID
2.0000 | Freq: Once | INTRAVENOUS | Status: AC | PRN
  Administered 2017-01-06: 2 via ORAL

## 2017-01-07 ENCOUNTER — Telehealth: Payer: Self-pay | Admitting: Family Medicine

## 2017-01-07 ENCOUNTER — Ambulatory Visit: Payer: Medicare Other | Admitting: Gastroenterology

## 2017-01-07 DIAGNOSIS — E876 Hypokalemia: Secondary | ICD-10-CM | POA: Diagnosis not present

## 2017-01-07 DIAGNOSIS — Z85038 Personal history of other malignant neoplasm of large intestine: Secondary | ICD-10-CM | POA: Diagnosis not present

## 2017-01-07 DIAGNOSIS — Z794 Long term (current) use of insulin: Secondary | ICD-10-CM | POA: Diagnosis not present

## 2017-01-07 DIAGNOSIS — E785 Hyperlipidemia, unspecified: Secondary | ICD-10-CM | POA: Diagnosis not present

## 2017-01-07 DIAGNOSIS — E1159 Type 2 diabetes mellitus with other circulatory complications: Secondary | ICD-10-CM | POA: Diagnosis not present

## 2017-01-07 DIAGNOSIS — Z9181 History of falling: Secondary | ICD-10-CM | POA: Diagnosis not present

## 2017-01-07 DIAGNOSIS — I10 Essential (primary) hypertension: Secondary | ICD-10-CM | POA: Diagnosis not present

## 2017-01-07 DIAGNOSIS — R296 Repeated falls: Secondary | ICD-10-CM | POA: Diagnosis not present

## 2017-01-07 DIAGNOSIS — K219 Gastro-esophageal reflux disease without esophagitis: Secondary | ICD-10-CM | POA: Diagnosis not present

## 2017-01-07 NOTE — Telephone Encounter (Signed)
Pt needs to discuss meds, also needs to know when he has to make an appointment.

## 2017-01-08 NOTE — Telephone Encounter (Signed)
Called pt and scheduled appointment as requested by provider

## 2017-01-09 ENCOUNTER — Other Ambulatory Visit: Payer: Self-pay | Admitting: Family Medicine

## 2017-01-09 MED ORDER — INSULIN GLARGINE 100 UNIT/ML SOLOSTAR PEN: 40.0000 [IU] | PEN_INJECTOR | Freq: Every day | SUBCUTANEOUS | 5 refills | Status: DC

## 2017-01-10 DIAGNOSIS — E876 Hypokalemia: Secondary | ICD-10-CM | POA: Diagnosis not present

## 2017-01-10 DIAGNOSIS — R296 Repeated falls: Secondary | ICD-10-CM | POA: Diagnosis not present

## 2017-01-10 DIAGNOSIS — E785 Hyperlipidemia, unspecified: Secondary | ICD-10-CM | POA: Diagnosis not present

## 2017-01-10 DIAGNOSIS — I10 Essential (primary) hypertension: Secondary | ICD-10-CM | POA: Diagnosis not present

## 2017-01-10 DIAGNOSIS — Z85038 Personal history of other malignant neoplasm of large intestine: Secondary | ICD-10-CM | POA: Diagnosis not present

## 2017-01-10 DIAGNOSIS — E1159 Type 2 diabetes mellitus with other circulatory complications: Secondary | ICD-10-CM | POA: Diagnosis not present

## 2017-01-10 DIAGNOSIS — Z794 Long term (current) use of insulin: Secondary | ICD-10-CM | POA: Diagnosis not present

## 2017-01-10 DIAGNOSIS — K219 Gastro-esophageal reflux disease without esophagitis: Secondary | ICD-10-CM | POA: Diagnosis not present

## 2017-01-10 DIAGNOSIS — Z9181 History of falling: Secondary | ICD-10-CM | POA: Diagnosis not present

## 2017-01-13 ENCOUNTER — Encounter: Payer: Self-pay | Admitting: Family Medicine

## 2017-01-13 ENCOUNTER — Ambulatory Visit (INDEPENDENT_AMBULATORY_CARE_PROVIDER_SITE_OTHER): Payer: Medicare Other | Admitting: Family Medicine

## 2017-01-13 VITALS — BP 100/68 | HR 74 | Temp 98.3°F | Resp 20 | Ht 71.0 in | Wt 239.0 lb

## 2017-01-13 DIAGNOSIS — E876 Hypokalemia: Secondary | ICD-10-CM | POA: Diagnosis not present

## 2017-01-13 DIAGNOSIS — E873 Alkalosis: Secondary | ICD-10-CM | POA: Diagnosis not present

## 2017-01-13 LAB — COMPLETE METABOLIC PANEL WITH GFR
ALT: 16 U/L (ref 9–46)
AST: 16 U/L (ref 10–40)
Albumin: 3.1 g/dL — ABNORMAL LOW (ref 3.6–5.1)
Alkaline Phosphatase: 58 U/L (ref 40–115)
BUN: 3 mg/dL — ABNORMAL LOW (ref 7–25)
CHLORIDE: 107 mmol/L (ref 98–110)
CO2: 26 mmol/L (ref 20–31)
CREATININE: 0.54 mg/dL — AB (ref 0.60–1.35)
Calcium: 8.8 mg/dL (ref 8.6–10.3)
Glucose, Bld: 151 mg/dL — ABNORMAL HIGH (ref 70–99)
Potassium: 4.6 mmol/L (ref 3.5–5.3)
SODIUM: 142 mmol/L (ref 135–146)
TOTAL PROTEIN: 5.9 g/dL — AB (ref 6.1–8.1)
Total Bilirubin: 0.6 mg/dL (ref 0.2–1.2)

## 2017-01-13 NOTE — Progress Notes (Signed)
Subjective:    Patient ID: [name], male    DOB: [birth year], 51 y.o.   MRN: [id number]  HPI  12/30/16 Patient is a 51 year old white male with a history of secondary neuroendocrine tumor of the gastrointestinal tract. Referred here by his oncologist of her concerns about weight loss. 20 to the patient, his oncologist 1 him to see a gastroenterologist for colonoscopy. I last saw the patient may of 2017. Since that visit, he has lost 21 pounds by my scales. He states that he has no appetite. The smell of ffood makes him extremely nauseated. As soon as he eats, he becomes full very quickly and feels like he has to throw up. His oncologist perform a CT scan of the abdomen and pelvis which revealed mild diffuse nonspecific wall thickening of the intestines but no other abnormality was seen. He also perform lab work revealed hypokalemia with potassium of 3.1 and a bicarbonate greater than 30 suggesting significant metabolic alkalosis. Patient denies any ingestion. Last saw the patient in May of last year, his hemoglobin A1c was 6.6 indicating adequate control. He is currently on metformin. He also takes insulin however he doses himself haphazardly as he sees fit and is therefore on no standard dose of insulin. He is also supposed to be taking metoclopramide for nausea and vomiting suspected gastroparesis. He is only taking metoclopramide once a day because it makes him sleepy.  At that time, my plan was: I certainly agree with a GI referral for colonoscopy given his history of cancer. However I'll also schedule a gastric emptying study as I'm concerned the patient may have gastroparesis particular given his long history of uncontrolled diabetes mellitus. Given his weight loss, I will also check a TSH. I will temporarily have the patient discontinue metformin due to his potential GI upset. I'll also evaluate the patient by repeating a CMP to determine if his metabolic colic alkalosis persist. I will also  recheck his potassium. Causes of metabolic alkalosis include severe hypokalemia, contraction alkalosis particularly given his poor by mouth intake.  He denies laxative abuse.  He is not taking any loop or thiazide diuretic.  01/13/17 Patient was found to have severe metabolic alkalosis with a bicarbonate level greater than 35. He was also found to be hypokalemic and also have a low magnesium.  Unfortunately, before he can start outpatient replacement, he was admitted the hospital due to weakness in his legs. He states that his legs simply fell asleep on him and his muscles would not work. In the hospital he was given potassium and magnesium and was discharged home. Gastric emptying study performed in the hospital revealed no evidence of gastroparesis. He is currently taking potassium but is on no magnesium. He is also not taking anything to address his metabolic alkalosis. He states that his gastrointestinal difficulties have improved after potassium and magnesium however he continues to have weakness in the muscles of his legs bilaterally   Past Medical History:  Diagnosis Date  . Cancer [facility]) carcinoid  . Colon cancer ([place])   . GERD (gastroesophageal reflux disease)   . Hyperlipemia   . Hypertension   . Kidney stones   . Shortness of breath    with exertion  . Type II diabetes mellitus ([place])   . UTI (lower urinary tract infection)   . Ventral hernia    Past Surgical History:  Procedure Laterality Date  . ABDOMINAL HERNIA REPAIR  01/20/2014   recurrent VHR w/mesh  . CHOLECYSTECTOMY    .  COLECTOMY  07/2008   "got all the cancer out"  . HERNIA REPAIR  02/2009; 01/20/2014   IHR; VHR  . INSERTION OF MESH N/A 01/20/2014   Procedure: INSERTION OF MESH;  Surgeon: [name]. [name], MD;  Location: [place];  Service: General;  Laterality: N/A;  . [place]   "had to open me up after they busted; cut just above pubis"  . VENTRAL HERNIA REPAIR N/A 01/20/2014   Procedure: OPEN REPAIR OF  A RECURRENT VENTRAL HERNIA REPAIR;  Surgeon: [name]. [name], MD;  Location: [facility];  Service: General;  Laterality: N/A;   Current Outpatient Prescriptions on File Prior to Visit  Medication Sig Dispense Refill  . feeding supplement, GLUCERNA SHAKE, (GLUCERNA SHAKE) LIQD Take 237 mLs by mouth 3 (three) times daily between meals.  0  . Insulin Glargine (LANTUS SOLOSTAR) 100 UNIT/ML Solostar Pen Inject 40 Units into the skin daily. 20 mL 5  . Insulin Pen Needle 31G X 8 MM MISC Use daily with pen 100 each 3  . metoCLOPramide (REGLAN) 10 MG tablet TK 1 T PO  QID.  3  . Multiple Vitamin (MULTIVITAMIN WITH MINERALS) TABS tablet Take 1 tablet by mouth daily.    . ondansetron (ZOFRAN) 4 MG tablet Take 1 tablet (4 mg total) by mouth every 6 (six) hours as needed for nausea. 20 tablet 0  . pioglitazone (ACTOS) 30 MG tablet TAKE 1 TABLET BY MOUTH DAILY 30 tablet 0  . potassium chloride SA (K-DUR,KLOR-CON) 20 MEQ tablet Take 2 tablets (40 mEq total) by mouth daily. 30 tablet 0  . rosuvastatin (CRESTOR) 20 MG tablet TAKE 1 TABLET BY MOUTH EVERY DAY 30 tablet 0   No current facility-administered medications on file prior to visit.    No Known Allergies Social History   Social History  . Marital status: Single    Spouse name: N/A  . Number of children: N/A  . Years of education: N/A   Occupational History  . Not on file.   Social History Main Topics  . Smoking status: Never Smoker  . Smokeless tobacco: Never Used  . Alcohol use No  . Drug use: No  . Sexual activity: Not Currently   Other Topics Concern  . Not on file   Social History Narrative  . No narrative on file   Family History  Problem Relation Age of Onset  . Cancer Mother 77    breast cancer  . Breast cancer Mother   . Diabetes Mother   . Emphysema Father   . Diabetes Sister     Review of Systems  All other systems reviewed and are negative.      Objective:   Physical Exam  Constitutional: He is oriented to  person, place, and time. He appears well-developed and well-nourished. No distress.  HENT:  Head: Normocephalic and atraumatic.  Right Ear: External ear normal.  Left Ear: External ear normal.  Nose: Nose normal.  Mouth/Throat: Oropharynx is clear and moist. No oropharyngeal exudate.  Eyes: Conjunctivae and EOM are normal. Pupils are equal, round, and reactive to light. Right eye exhibits no discharge. Left eye exhibits no discharge. No scleral icterus.  Neck: Normal range of motion. Neck supple. No JVD present. No tracheal deviation present. No thyromegaly present.  Cardiovascular: Normal rate, regular rhythm, normal heart sounds and intact distal pulses.  Exam reveals no gallop and no friction rub.   No murmur heard. Pulmonary/Chest: Effort normal and breath sounds normal. No stridor.  No respiratory distress. He has no wheezes. He has no rales. He exhibits no tenderness.  Abdominal: Soft. Bowel sounds are normal. He exhibits no distension and no mass. There is no tenderness. There is no rebound and no guarding.  Musculoskeletal: Normal range of motion. He exhibits no edema or tenderness.  Lymphadenopathy:    He has no cervical adenopathy.  Neurological: He is alert and oriented to person, place, and time. He has normal reflexes. No cranial nerve deficit. He exhibits normal muscle tone. Coordination normal.  Skin: Skin is warm. No rash noted. He is not diaphoretic. No erythema. No pallor.  Psychiatric: He has a normal mood and affect. His behavior is normal. Judgment and thought content normal.  Vitals reviewed.         Assessment & Plan:  Metabolic alkalosis - Plan: COMPLETE METABOLIC PANEL WITH GFR  Hypokalemia - Plan: Magnesium  Hypomagnesemia  I truly believe that the patient's electrolyte abnormalities of the cause of all of his symptoms. I had the patient discontinue metformin due to an elevated lactic acid level in his blood as well as a gastrointestinal side effects and the  fact his hemoglobin A1c was well controlled. He is currently taking Lantus insulin and his fasting blood sugars are around 130 per his report today. Therefore this is sufficient. I want him to continue potassium. I will recheck his potassium and his magnesium level. I suspect the patient will need chronic magnesium supplementation as well. I believe once we get the potassium and magnesium regulated, his muscle weakness in his legs will improve. I will also recheck his bicarbonate level and if still elevated, the patient will likely benefit from acetazolamide for metabolic alkalosis

## 2017-01-14 LAB — MAGNESIUM: Magnesium: 1.4 mg/dL — ABNORMAL LOW (ref 1.5–2.5)

## 2017-01-15 ENCOUNTER — Other Ambulatory Visit: Payer: Self-pay | Admitting: Family Medicine

## 2017-01-15 DIAGNOSIS — R296 Repeated falls: Secondary | ICD-10-CM | POA: Diagnosis not present

## 2017-01-15 DIAGNOSIS — E876 Hypokalemia: Secondary | ICD-10-CM | POA: Diagnosis not present

## 2017-01-15 DIAGNOSIS — I10 Essential (primary) hypertension: Secondary | ICD-10-CM | POA: Diagnosis not present

## 2017-01-15 DIAGNOSIS — Z9181 History of falling: Secondary | ICD-10-CM | POA: Diagnosis not present

## 2017-01-15 DIAGNOSIS — K219 Gastro-esophageal reflux disease without esophagitis: Secondary | ICD-10-CM | POA: Diagnosis not present

## 2017-01-15 DIAGNOSIS — E785 Hyperlipidemia, unspecified: Secondary | ICD-10-CM | POA: Diagnosis not present

## 2017-01-15 DIAGNOSIS — Z85038 Personal history of other malignant neoplasm of large intestine: Secondary | ICD-10-CM | POA: Diagnosis not present

## 2017-01-15 DIAGNOSIS — E1159 Type 2 diabetes mellitus with other circulatory complications: Secondary | ICD-10-CM | POA: Diagnosis not present

## 2017-01-15 DIAGNOSIS — R790 Abnormal level of blood mineral: Secondary | ICD-10-CM

## 2017-01-15 DIAGNOSIS — Z794 Long term (current) use of insulin: Secondary | ICD-10-CM | POA: Diagnosis not present

## 2017-01-15 MED ORDER — MAGNESIUM CHLORIDE 64 MG PO TBEC: 1.0000 | DELAYED_RELEASE_TABLET | Freq: Two times a day (BID) | ORAL | 3 refills | Status: DC

## 2017-01-17 DIAGNOSIS — Z794 Long term (current) use of insulin: Secondary | ICD-10-CM | POA: Diagnosis not present

## 2017-01-17 DIAGNOSIS — E785 Hyperlipidemia, unspecified: Secondary | ICD-10-CM | POA: Diagnosis not present

## 2017-01-17 DIAGNOSIS — Z85038 Personal history of other malignant neoplasm of large intestine: Secondary | ICD-10-CM | POA: Diagnosis not present

## 2017-01-17 DIAGNOSIS — E876 Hypokalemia: Secondary | ICD-10-CM | POA: Diagnosis not present

## 2017-01-17 DIAGNOSIS — Z9181 History of falling: Secondary | ICD-10-CM | POA: Diagnosis not present

## 2017-01-17 DIAGNOSIS — K219 Gastro-esophageal reflux disease without esophagitis: Secondary | ICD-10-CM | POA: Diagnosis not present

## 2017-01-17 DIAGNOSIS — I10 Essential (primary) hypertension: Secondary | ICD-10-CM | POA: Diagnosis not present

## 2017-01-17 DIAGNOSIS — E1159 Type 2 diabetes mellitus with other circulatory complications: Secondary | ICD-10-CM | POA: Diagnosis not present

## 2017-01-17 DIAGNOSIS — R296 Repeated falls: Secondary | ICD-10-CM | POA: Diagnosis not present

## 2017-01-21 DIAGNOSIS — K219 Gastro-esophageal reflux disease without esophagitis: Secondary | ICD-10-CM | POA: Diagnosis not present

## 2017-01-21 DIAGNOSIS — Z85038 Personal history of other malignant neoplasm of large intestine: Secondary | ICD-10-CM | POA: Diagnosis not present

## 2017-01-21 DIAGNOSIS — R296 Repeated falls: Secondary | ICD-10-CM | POA: Diagnosis not present

## 2017-01-21 DIAGNOSIS — E785 Hyperlipidemia, unspecified: Secondary | ICD-10-CM | POA: Diagnosis not present

## 2017-01-21 DIAGNOSIS — E1159 Type 2 diabetes mellitus with other circulatory complications: Secondary | ICD-10-CM | POA: Diagnosis not present

## 2017-01-21 DIAGNOSIS — E876 Hypokalemia: Secondary | ICD-10-CM | POA: Diagnosis not present

## 2017-01-21 DIAGNOSIS — Z9181 History of falling: Secondary | ICD-10-CM | POA: Diagnosis not present

## 2017-01-21 DIAGNOSIS — I10 Essential (primary) hypertension: Secondary | ICD-10-CM | POA: Diagnosis not present

## 2017-01-21 DIAGNOSIS — Z794 Long term (current) use of insulin: Secondary | ICD-10-CM | POA: Diagnosis not present

## 2017-01-29 ENCOUNTER — Ambulatory Visit (INDEPENDENT_AMBULATORY_CARE_PROVIDER_SITE_OTHER): Payer: Medicare Other | Admitting: Family Medicine

## 2017-01-29 VITALS — BP 102/78 | HR 80 | Temp 98.7°F | Resp 18 | Ht 71.0 in | Wt 230.0 lb

## 2017-01-29 DIAGNOSIS — E876 Hypokalemia: Secondary | ICD-10-CM

## 2017-01-29 LAB — BASIC METABOLIC PANEL WITH GFR
BUN: 5 mg/dL — ABNORMAL LOW (ref 7–25)
CHLORIDE: 102 mmol/L (ref 98–110)
CO2: 23 mmol/L (ref 20–31)
Calcium: 9.2 mg/dL (ref 8.6–10.3)
Creat: 0.56 mg/dL — ABNORMAL LOW (ref 0.60–1.35)
GFR, Est African American: >89 mL/min (ref >=60)
GFR, Est Non African American: >89 mL/min (ref >=60)
GLUCOSE: 182 mg/dL — AB (ref 70–99)
POTASSIUM: 4.2 mmol/L (ref 3.5–5.3)
Sodium: 137 mmol/L (ref 135–146)

## 2017-01-29 NOTE — Progress Notes (Signed)
Subjective:    Patient ID: [name], male    DOB: 07/01/1966, 51 y.o.   MRN: [id number]  HPI  12/30/16 Patient is a 51 year old white male with a history of secondary neuroendocrine tumor of the gastrointestinal tract. Referred here by his oncologist of her concerns about weight loss. 20 to the patient, his oncologist 1 him to see a gastroenterologist for colonoscopy. I last saw the patient may of 2017. Since that visit, he has lost 21 pounds by my scales. He states that he has no appetite. The smell of ffood makes him extremely nauseated. As soon as he eats, he becomes full very quickly and feels like he has to throw up. His oncologist perform a CT scan of the abdomen and pelvis which revealed mild diffuse nonspecific wall thickening of the intestines but no other abnormality was seen. He also perform lab work revealed hypokalemia with potassium of 3.1 and a bicarbonate greater than 30 suggesting significant metabolic alkalosis. Patient denies any ingestion. Last saw the patient in May of last year, his hemoglobin A1c was 6.6 indicating adequate control. He is currently on metformin. He also takes insulin however he doses himself haphazardly as he sees fit and is therefore on no standard dose of insulin. He is also supposed to be taking metoclopramide for nausea and vomiting suspected gastroparesis. He is only taking metoclopramide once a day because it makes him sleepy.  At that time, my plan was: I certainly agree with a GI referral for colonoscopy given his history of cancer. However I'll also schedule a gastric emptying study as I'm concerned the patient may have gastroparesis particular given his long history of uncontrolled diabetes mellitus. Given his weight loss, I will also check a TSH. I will temporarily have the patient discontinue metformin due to his potential GI upset. I'll also evaluate the patient by repeating a CMP to determine if his metabolic colic alkalosis persist. I will also  recheck his potassium. Causes of metabolic alkalosis include severe hypokalemia, contraction alkalosis particularly given his poor by mouth intake.  He denies laxative abuse.  He is not taking any loop or thiazide diuretic.  01/13/17 Patient was found to have severe metabolic alkalosis with a bicarbonate level greater than 35. He was also found to be hypokalemic and also have a low magnesium.  Unfortunately, before he can start outpatient replacement, he was admitted the hospital due to weakness in his legs. He states that his legs simply fell asleep on him and his muscles would not work. In the hospital he was given potassium and magnesium and was discharged home. Gastric emptying study performed in the hospital revealed no evidence of gastroparesis. He is currently taking potassium but is on no magnesium. He is also not taking anything to address his metabolic alkalosis. He states that his gastrointestinal difficulties have improved after potassium and magnesium however he continues to have weakness in the muscles of his legs bilaterally.  At that time, my plan was: I truly believe that the patient's electrolyte abnormalities of the cause of all of his symptoms. I had the patient discontinue metformin due to an elevated lactic acid level in his blood as well as a gastrointestinal side effects and the fact his hemoglobin A1c was well controlled. He is currently taking Lantus insulin and his fasting blood sugars are around 130 per his report today. Therefore this is sufficient. I want him to continue potassium. I will recheck his potassium and his magnesium level. I suspect the patient  will need chronic magnesium supplementation as well. I believe once we get the potassium and magnesium regulated, his muscle weakness in his legs will improve. I will also recheck his bicarbonate level and if still elevated, the patient will likely benefit from acetazolamide for metabolic alkalosis  03/05/02 No visits with results  within 2 Week(s) from this visit.  Latest known visit with results is:  Office Visit on 01/13/2017  Component Date Value Ref Range Status  . Sodium 01/13/2017 142  135 - 146 mmol/L Final  . Potassium 01/13/2017 4.6  3.5 - 5.3 mmol/L Final  . Chloride 01/13/2017 107  98 - 110 mmol/L Final  . CO2 01/13/2017 26  20 - 31 mmol/L Final  . Glucose, Bld 01/13/2017 151* 70 - 99 mg/dL Final  . BUN 01/13/2017 3* 7 - 25 mg/dL Final  . Creat 01/13/2017 0.54* 0.60 - 1.35 mg/dL Final  . Total Bilirubin 01/13/2017 0.6  0.2 - 1.2 mg/dL Final  . Alkaline Phosphatase 01/13/2017 58  40 - 115 U/L Final  . AST 01/13/2017 16  10 - 40 U/L Final  . ALT 01/13/2017 16  9 - 46 U/L Final  . Total Protein 01/13/2017 5.9* 6.1 - 8.1 g/dL Final  . Albumin 01/13/2017 3.1* 3.6 - 5.1 g/dL Final  . Calcium 01/13/2017 8.8  8.6 - 10.3 mg/dL Final  . GFR, Est African American 01/13/2017 >89  >=60 mL/min Final  . GFR, Est Non African American 01/13/2017 >89  >=60 mL/min Final  . Magnesium 01/13/2017 1.4* 1.5 - 2.5 mg/dL Final    Commended that the patient take Slow-Mag and continue potassium for 1 week and then allow [name] to recheck his electrolytes. He is here today for follow-up albeit a little bit later.  He feels much better since his electrolytes have normalized and his bicarbonate level is back to normal. The intestinal problems that he originally presented with are much better. He is now tolerating by mouth without difficulty. He denies any muscle weakness. His biggest complaint now is pain in his right knee that occurred after the fall. I reviewed the x-ray findings from the hospital in April. X-ray on April 26 reveal no fracture in the knee. He does have a small joint effusion and a bruise over the anterior tibial tubercle. He declines a cortisone injection in the knee today to treat in the spring. He believes that he would like to give it more time to resolve on its own.  Past Medical History:  Diagnosis Date  . Cancer  [facility]) carcinoid  . Colon cancer ([place])   . GERD (gastroesophageal reflux disease)   . Hyperlipemia   . Hypertension   . Kidney stones   . Shortness of breath    with exertion  . Type II diabetes mellitus ([place])   . UTI (lower urinary tract infection)   . Ventral hernia    Past Surgical History:  Procedure Laterality Date  . ABDOMINAL HERNIA REPAIR  01/20/2014   recurrent VHR w/mesh  . CHOLECYSTECTOMY    . COLECTOMY  07/2008   "got all the cancer out"  . HERNIA REPAIR  02/2009; 01/20/2014   IHR; VHR  . INSERTION OF MESH N/A 01/20/2014   Procedure: INSERTION OF MESH;  Surgeon: [name]. [name], MD;  Location: [place];  Service: General;  Laterality: N/A;  . [place]   "had to open me up after they busted; cut just above pubis"  . VENTRAL HERNIA REPAIR N/A 01/20/2014  Procedure: OPEN REPAIR OF A RECURRENT VENTRAL HERNIA REPAIR;  Surgeon: [name]. [name], MD;  Location: [place] [facility];  Service: General;  Laterality: N/A;   Current Outpatient Prescriptions on File Prior to Visit  Medication Sig Dispense Refill  . feeding supplement, GLUCERNA SHAKE, (GLUCERNA SHAKE) LIQD Take 237 mLs by mouth 3 (three) times daily between meals.  0  . Insulin Glargine (LANTUS SOLOSTAR) 100 UNIT/ML Solostar Pen Inject 40 Units into the skin daily. 20 mL 5  . Insulin Pen Needle 31G X 8 MM MISC Use daily with pen 100 each 3  . magnesium chloride (SLOW-MAG) 64 MG TBEC SR tablet Take 1 tablet (64 mg total) by mouth 2 (two) times daily. 60 tablet 3  . metoCLOPramide (REGLAN) 10 MG tablet TK 1 T PO  QID.  3  . Multiple Vitamin (MULTIVITAMIN WITH MINERALS) TABS tablet Take 1 tablet by mouth daily.    . ondansetron (ZOFRAN) 4 MG tablet Take 1 tablet (4 mg total) by mouth every 6 (six) hours as needed for nausea. 20 tablet 0  . pioglitazone (ACTOS) 30 MG tablet TAKE 1 TABLET BY MOUTH DAILY 30 tablet 0  . potassium chloride SA (K-DUR,KLOR-CON) 20 MEQ tablet Take 2 tablets (40 mEq total) by mouth daily. 30  tablet 0  . rosuvastatin (CRESTOR) 20 MG tablet TAKE 1 TABLET BY MOUTH EVERY DAY 30 tablet 0   No current facility-administered medications on file prior to visit.    No Known Allergies Social History   Social History  . Marital status: Single    Spouse name: N/A  . Number of children: N/A  . Years of education: N/A   Occupational History  . Not on file.   Social History Main Topics  . Smoking status: Never Smoker  . Smokeless tobacco: Never Used  . Alcohol use No  . Drug use: No  . Sexual activity: Not Currently   Other Topics Concern  . Not on file   Social History Narrative  . No narrative on file   Family History  Problem Relation Age of Onset  . Cancer Mother 19       breast cancer  . Breast cancer Mother   . Diabetes Mother   . Emphysema Father   . Diabetes Sister     Review of Systems  All other systems reviewed and are negative.      Objective:   Physical Exam  Constitutional: He is oriented to person, place, and time. He appears well-developed and well-nourished. No distress.  HENT:  Head: Normocephalic and atraumatic.  Right Ear: External ear normal.  Left Ear: External ear normal.  Nose: Nose normal.  Mouth/Throat: Oropharynx is clear and moist. No oropharyngeal exudate.  Eyes: Conjunctivae and EOM are normal. Pupils are equal, round, and reactive to light. Right eye exhibits no discharge. Left eye exhibits no discharge. No scleral icterus.  Neck: Normal range of motion. Neck supple. No JVD present. No tracheal deviation present. No thyromegaly present.  Cardiovascular: Normal rate, regular rhythm, normal heart sounds and intact distal pulses.  Exam reveals no gallop and no friction rub.   No murmur heard. Pulmonary/Chest: Effort normal and breath sounds normal. No stridor. No respiratory distress. He has no wheezes. He has no rales. He exhibits no tenderness.  Abdominal: Soft. Bowel sounds are normal. He exhibits no distension and no mass. There  is no tenderness. There is no rebound and no guarding.  Musculoskeletal: Normal range of motion. He exhibits no edema or tenderness.  Lymphadenopathy:    He has no cervical adenopathy.  Neurological: He is alert and oriented to person, place, and time. He has normal reflexes. No cranial nerve deficit. He exhibits normal muscle tone. Coordination normal.  Skin: Skin is warm. No rash noted. He is not diaphoretic. No erythema. No pallor.  Psychiatric: He has a normal mood and affect. His behavior is normal. Judgment and thought content normal.  Vitals reviewed.         Assessment & Plan:  Hypokalemia - Plan: BASIC METABOLIC PANEL WITH GFR, Magnesium  Hypomagnesemia - Plan: BASIC METABOLIC PANEL WITH GFR, Magnesium  The majority of his symptoms are back to his baseline now. Recheck potassium, magnesium, and bicarbonate to determine the length of repletion that is necessary. I believe the pain in his knee is secondary to a knee sprain that occurred when he fell. However he declines a cortisone injection for that today. He can take over-the-counter anti-inflammatories for pain if necessary

## 2017-01-30 LAB — MAGNESIUM: MAGNESIUM: 1.5 mg/dL (ref 1.5–2.5)

## 2017-01-31 ENCOUNTER — Other Ambulatory Visit: Payer: Self-pay | Admitting: Family Medicine

## 2017-01-31 DIAGNOSIS — E876 Hypokalemia: Secondary | ICD-10-CM

## 2017-02-09 ENCOUNTER — Other Ambulatory Visit: Payer: Self-pay | Admitting: Family Medicine

## 2017-03-03 ENCOUNTER — Other Ambulatory Visit: Payer: Medicare Other

## 2017-03-03 DIAGNOSIS — E876 Hypokalemia: Secondary | ICD-10-CM | POA: Diagnosis not present

## 2017-03-03 LAB — COMPREHENSIVE METABOLIC PANEL
ALT: 14 U/L (ref 9–46)
AST: 15 U/L (ref 10–40)
Albumin: 3.5 g/dL — ABNORMAL LOW (ref 3.6–5.1)
Alkaline Phosphatase: 64 U/L (ref 40–115)
BUN: 3 mg/dL — ABNORMAL LOW (ref 7–25)
CO2: 25 mmol/L (ref 20–31)
Calcium: 9 mg/dL (ref 8.6–10.3)
Chloride: 102 mmol/L (ref 98–110)
Creat: 0.69 mg/dL (ref 0.60–1.35)
Glucose, Bld: 223 mg/dL — ABNORMAL HIGH (ref 70–99)
Potassium: 3.8 mmol/L (ref 3.5–5.3)
Sodium: 138 mmol/L (ref 135–146)
Total Bilirubin: 0.7 mg/dL (ref 0.2–1.2)
Total Protein: 6.4 g/dL (ref 6.1–8.1)

## 2017-03-04 LAB — MAGNESIUM: MAGNESIUM: 1.4 mg/dL — AB (ref 1.5–2.5)

## 2017-03-11 ENCOUNTER — Other Ambulatory Visit: Payer: Self-pay | Admitting: Family Medicine

## 2017-03-13 ENCOUNTER — Other Ambulatory Visit: Payer: Self-pay

## 2017-03-13 NOTE — Patient Outreach (Signed)
[facility]) Care Management  03/13/2017  [name] 06-Jul-1966 [id number]  Medication Adherence call to Mr.[name] Mr. gildersleeve is past due on his rosuvastatin 20 mg under [name] patient pick up medication on March 13, 2017 he is not due until august 5,2018     [facility] Direct Dial [phone]  Fax [phone] [email or web address]

## 2017-04-14 DIAGNOSIS — I890 Lymphedema, not elsewhere classified: Secondary | ICD-10-CM | POA: Diagnosis not present

## 2017-04-14 DIAGNOSIS — M19072 Primary osteoarthritis, left ankle and foot: Secondary | ICD-10-CM | POA: Diagnosis not present

## 2017-04-14 DIAGNOSIS — M19071 Primary osteoarthritis, right ankle and foot: Secondary | ICD-10-CM | POA: Diagnosis not present

## 2017-04-25 ENCOUNTER — Encounter (HOSPITAL_COMMUNITY): Payer: Self-pay | Admitting: *Deleted

## 2017-04-25 DIAGNOSIS — R2240 Localized swelling, mass and lump, unspecified lower limb: Secondary | ICD-10-CM | POA: Insufficient documentation

## 2017-04-25 DIAGNOSIS — Z5321 Procedure and treatment not carried out due to patient leaving prior to being seen by health care provider: Secondary | ICD-10-CM | POA: Diagnosis not present

## 2017-04-25 NOTE — ED Triage Notes (Signed)
Pt in c/o bil ankle & foot swelling that is chronic, pt states, "I saw a foot doctor earlier this month and have an appointment at [place] vein doctor." pt denies SOB, reports pain 1/10, reports skin intact, swelling present in bil lower extremities, pt ambulatory, A&O x4

## 2017-04-26 ENCOUNTER — Emergency Department (HOSPITAL_COMMUNITY)
Admission: EM | Admit: 2017-04-26 | Discharge: 2017-04-26 | Discharge status: Against Medical Advice | Payer: Medicare Other | Attending: Emergency Medicine | Admitting: Emergency Medicine

## 2017-04-26 NOTE — ED Notes (Addendum)
Pt stated he did not want to stay.  I encouraged him that he was next to go back and we had rooms coming open.  Pt stated that he still wanted to leave.  Pt LWBS

## 2017-04-30 DIAGNOSIS — R600 Localized edema: Secondary | ICD-10-CM | POA: Diagnosis not present

## 2017-05-01 ENCOUNTER — Ambulatory Visit: Payer: Medicare Other | Admitting: Family Medicine

## 2017-05-05 ENCOUNTER — Encounter: Payer: Self-pay | Admitting: Family Medicine

## 2017-05-08 ENCOUNTER — Other Ambulatory Visit: Payer: Self-pay | Admitting: Family Medicine

## 2017-05-08 DIAGNOSIS — I8311 Varicose veins of right lower extremity with inflammation: Secondary | ICD-10-CM | POA: Diagnosis not present

## 2017-05-08 DIAGNOSIS — I8312 Varicose veins of left lower extremity with inflammation: Secondary | ICD-10-CM | POA: Diagnosis not present

## 2017-05-08 DIAGNOSIS — R600 Localized edema: Secondary | ICD-10-CM | POA: Diagnosis not present

## 2017-05-08 MED ORDER — POTASSIUM CHLORIDE CRYS ER 20 MEQ PO TBCR: 20.0000 meq | EXTENDED_RELEASE_TABLET | Freq: Every day | ORAL | 3 refills | Status: DC

## 2017-05-13 DIAGNOSIS — G5790 Unspecified mononeuropathy of unspecified lower limb: Secondary | ICD-10-CM | POA: Diagnosis not present

## 2017-05-20 ENCOUNTER — Telehealth: Payer: Self-pay | Admitting: Family Medicine

## 2017-05-20 DIAGNOSIS — E1165 Type 2 diabetes mellitus with hyperglycemia: Secondary | ICD-10-CM

## 2017-05-20 DIAGNOSIS — E782 Mixed hyperlipidemia: Principal | ICD-10-CM

## 2017-05-20 DIAGNOSIS — Z794 Long term (current) use of insulin: Secondary | ICD-10-CM

## 2017-05-20 DIAGNOSIS — E119 Type 2 diabetes mellitus without complications: Secondary | ICD-10-CM

## 2017-05-20 DIAGNOSIS — E1169 Type 2 diabetes mellitus with other specified complication: Secondary | ICD-10-CM

## 2017-05-20 NOTE — Telephone Encounter (Signed)
Pt has been seen for diabetic foot care and they need referral faxed to them.  Referral placed.  Pt was seen on 04/14/17.

## 2017-06-10 DIAGNOSIS — G5790 Unspecified mononeuropathy of unspecified lower limb: Secondary | ICD-10-CM | POA: Diagnosis not present

## 2017-06-12 ENCOUNTER — Telehealth: Payer: Self-pay | Admitting: Family Medicine

## 2017-06-12 DIAGNOSIS — M79672 Pain in left foot: Secondary | ICD-10-CM

## 2017-06-12 DIAGNOSIS — M5442 Lumbago with sciatica, left side: Principal | ICD-10-CM

## 2017-06-12 DIAGNOSIS — M79671 Pain in right foot: Secondary | ICD-10-CM

## 2017-06-12 DIAGNOSIS — M5441 Lumbago with sciatica, right side: Secondary | ICD-10-CM

## 2017-06-12 NOTE — Telephone Encounter (Signed)
ok 

## 2017-06-12 NOTE — Telephone Encounter (Signed)
Pt had gone to podiatrist for diabetic foot care and foot pain.  Told podiatrist history of fall and back pain.  Podiatrist feels some of his foot pain may be back related due to fall.  They have referred him to a Neurologist in [place]  Dr Trula Ore at [facility].  Because he has Medicaid secondary to his Medicare, they need approval.  Referral placed and faxed to them.  They will contact him to make appt.

## 2017-06-19 ENCOUNTER — Other Ambulatory Visit: Payer: Self-pay | Admitting: Family Medicine

## 2017-06-28 ENCOUNTER — Encounter (HOSPITAL_COMMUNITY): Payer: Self-pay

## 2017-06-28 ENCOUNTER — Emergency Department (HOSPITAL_COMMUNITY)
Admission: EM | Admit: 2017-06-28 | Discharge: 2017-06-28 | Disposition: A | Discharge status: Home / Self Care | Payer: Medicare Other | Attending: Emergency Medicine | Admitting: Emergency Medicine

## 2017-06-28 DIAGNOSIS — I10 Essential (primary) hypertension: Secondary | ICD-10-CM | POA: Diagnosis not present

## 2017-06-28 DIAGNOSIS — R0602 Shortness of breath: Secondary | ICD-10-CM | POA: Diagnosis not present

## 2017-06-28 DIAGNOSIS — R2242 Localized swelling, mass and lump, left lower limb: Secondary | ICD-10-CM | POA: Diagnosis present

## 2017-06-28 DIAGNOSIS — Z859 Personal history of malignant neoplasm, unspecified: Secondary | ICD-10-CM | POA: Diagnosis not present

## 2017-06-28 DIAGNOSIS — Z79899 Other long term (current) drug therapy: Secondary | ICD-10-CM | POA: Insufficient documentation

## 2017-06-28 DIAGNOSIS — E1142 Type 2 diabetes mellitus with diabetic polyneuropathy: Secondary | ICD-10-CM | POA: Insufficient documentation

## 2017-06-28 DIAGNOSIS — Z85038 Personal history of other malignant neoplasm of large intestine: Secondary | ICD-10-CM | POA: Diagnosis not present

## 2017-06-28 LAB — BASIC METABOLIC PANEL
Anion gap: 7 (ref 5–15)
CALCIUM: 8.7 mg/dL — AB (ref 8.9–10.3)
CO2: 26 mmol/L (ref 22–32)
CREATININE: 0.64 mg/dL (ref 0.61–1.24)
Chloride: 102 mmol/L (ref 101–111)
GFR calc Af Amer: >60 mL/min (ref >60)
GFR calc non Af Amer: >60 mL/min (ref >60)
Glucose, Bld: 135 mg/dL — ABNORMAL HIGH (ref 65–99)
Potassium: 3.6 mmol/L (ref 3.5–5.1)
Sodium: 135 mmol/L (ref 135–145)

## 2017-06-28 LAB — CBC WITH DIFFERENTIAL/PLATELET
BASOS ABS: 0 10*3/uL (ref 0.0–0.1)
Basophils Relative: 0 %
Eosinophils Absolute: 0.1 10*3/uL (ref 0.0–0.7)
Eosinophils Relative: 3 %
HEMATOCRIT: 39.9 % (ref 39.0–52.0)
Hemoglobin: 13.7 g/dL (ref 13.0–17.0)
LYMPHS ABS: 1.6 10*3/uL (ref 0.7–4.0)
Lymphocytes Relative: 55 %
MCH: 37.4 pg — ABNORMAL HIGH (ref 26.0–34.0)
MCHC: 34.3 g/dL (ref 30.0–36.0)
MCV: 109 fL — ABNORMAL HIGH (ref 78.0–100.0)
MONOS PCT: 8 %
Monocytes Absolute: 0.2 10*3/uL (ref 0.1–1.0)
NEUTROS PCT: 34 %
Neutro Abs: 1 10*3/uL — ABNORMAL LOW (ref 1.7–7.7)
Platelets: 175 10*3/uL (ref 150–400)
RBC: 3.66 MIL/uL — AB (ref 4.22–5.81)
RDW: 13.7 % (ref 11.5–15.5)
WBC: 2.9 10*3/uL — AB (ref 4.0–10.5)

## 2017-06-28 LAB — BRAIN NATRIURETIC PEPTIDE: B NATRIURETIC PEPTIDE 5: 10.1 pg/mL (ref 0.0–100.0)

## 2017-06-28 LAB — FOLATE: Folate: 5.1 ng/mL — ABNORMAL LOW (ref >5.9)

## 2017-06-28 LAB — VITAMIN B12: Vitamin B-12: 209 pg/mL (ref 180–914)

## 2017-06-28 MED ORDER — GABAPENTIN 100 MG PO CAPS: 100.0000 mg | ORAL_CAPSULE | Freq: Three times a day (TID) | ORAL | 0 refills | Status: DC

## 2017-06-28 MED ORDER — MULTIVITAMINS PO CAPS: 1.0000 | ORAL_CAPSULE | Freq: Every day | ORAL | 0 refills | Status: DC

## 2017-06-28 NOTE — ED Triage Notes (Addendum)
Pt presents with BLE edema for months.  Pt reports swelling has worsened recently with R leg > L leg.  Pt has been seen for same by PCP.  Pt denies any chest discomfort or shortness of breath.

## 2017-06-28 NOTE — ED Provider Notes (Signed)
[facility] EMERGENCY DEPARTMENT Provider Note   CSN: [id number] Arrival date & time: 06/28/17  1250     History   Chief Complaint No chief complaint on file.   HPI [name] is a 51 y.o. male.  51 year old male history of type 2 diabetes, HTN, HLD, and gastroparesis who presents with left lower extremity swelling with decreased sensation.  Onset over past 2 months.  Patient states he has seen his primary doctor for the symptoms. Was sent to podiatry and "vein" doctor where he had a negative LE venous duplex within past month. States intermittent numbness over bilateral lower extremities. He is concerned about generalized weakness in lower extremities. Occasionally is lightheaded when he stands. Has poor diet with chronic vomiting and mainly drinks soda. Denies fevers, cough, chest pain, abdominal pain. No focal weakness or slurred speech.  The history is provided by the patient, a parent and medical records. No language interpreter was used.    Past Medical History:  Diagnosis Date  . Cancer [facility]) carcinoid  . Colon cancer ([place])   . GERD (gastroesophageal reflux disease)   . Hyperlipemia   . Hypertension   . Kidney stones   . Shortness of breath    with exertion  . Type II diabetes mellitus ([place])   . UTI (lower urinary tract infection)   . Ventral hernia     Patient Active Problem List   Diagnosis Date Noted  . Weakness 01/02/2017  . Hypokalemia 01/02/2017  . Hypomagnesemia 01/02/2017  . Seroma complicating a procedure 02/21/2014  . Recurrent ventral incisional hernia 01/20/2014  . Gastroparesis 05/27/2012  . Nausea and vomiting 03/25/2012  . History of malignant carcinoid tumor 03/25/2012  . FINGER SPRAIN 03/19/2010  . GERD 09/07/2009  . ANKLE INJURY, LEFT 05/31/2009  . ABDOMINAL PAIN 05/19/2009  . HEMATURIA, HX OF 05/19/2009  . VENTRAL HERNIA, INCISIONAL 12/23/2008  . URINARY TRACT INFECTION, ACUTE 10/21/2008  . TINEA PEDIS 09/14/2008    . Secondary neuroendocrine tumors ([place]) 07/22/2008  . ABRASION 03/16/2008  . Type 2 diabetes mellitus with hyperlipidemia ([place]) 05/18/2007  . HYPERLIPIDEMIA 05/18/2007  . ANGER 05/18/2007  . Essential hypertension 05/18/2007    Past Surgical History:  Procedure Laterality Date  . ABDOMINAL HERNIA REPAIR  01/20/2014   recurrent VHR w/mesh  . CHOLECYSTECTOMY    . COLECTOMY  07/2008   "got all the cancer out"  . HERNIA REPAIR  02/2009; 01/20/2014   IHR; VHR  . INSERTION OF MESH N/A 01/20/2014   Procedure: INSERTION OF MESH;  Surgeon: [name]. [name], MD;  Location: [place];  Service: General;  Laterality: N/A;  . [place]   "had to open me up after they busted; cut just above pubis"  . VENTRAL HERNIA REPAIR N/A 01/20/2014   Procedure: OPEN REPAIR OF A RECURRENT VENTRAL HERNIA REPAIR;  Surgeon: [name]. [name], MD;  Location: [place] OR;  Service: General;  Laterality: N/A;       Home Medications    Prior to Admission medications   Medication Sig Start Date End Date Taking? Authorizing Provider  acetaminophen (TYLENOL 8 HOUR ARTHRITIS PAIN) 650 MG CR tablet Take 650 mg by mouth every 8 (eight) hours as needed for pain.   Yes [provider]  ondansetron (ZOFRAN) 4 MG tablet Take 1 tablet (4 mg total) by mouth every 6 (six) hours as needed for nausea. 01/04/17  Yes Hosie Poisson, MD  pioglitazone (ACTOS) 30 MG tablet TAKE 1 TABLET BY  MOUTH DAILY Patient taking differently: TAKE 30 mg TABLET BY MOUTH DAILY 06/19/17  Yes Susy Frizzle, MD  potassium chloride SA (K-DUR,KLOR-CON) 20 MEQ tablet Take 1 tablet (20 mEq total) by mouth daily. 05/08/17  Yes Susy Frizzle, MD  rosuvastatin (CRESTOR) 20 MG tablet TAKE 1 TABLET BY MOUTH DAILY Patient taking differently: TAKE 20 mg TABLET BY MOUTH DAILY 03/13/17  Yes Susy Frizzle, MD  feeding supplement, GLUCERNA SHAKE, (GLUCERNA SHAKE) LIQD Take 237 mLs by mouth 3 (three) times daily between meals. Patient not  taking: Reported on 06/28/2017 01/04/17   Hosie Poisson, MD  gabapentin (NEURONTIN) 100 MG capsule Take 1 capsule (100 mg total) by mouth 3 (three) times daily. 06/28/17   Payton Emerald, MD  Insulin Glargine (LANTUS SOLOSTAR) 100 UNIT/ML Solostar Pen Inject 40 Units into the skin daily. Patient not taking: Reported on 06/28/2017 01/09/17   Susy Frizzle, MD  Insulin Pen Needle 31G X 8 MM MISC Use daily with pen Patient not taking: Reported on 06/28/2017 06/27/16   Susy Frizzle, MD  magnesium chloride (SLOW-MAG) 64 MG TBEC SR tablet Take 1 tablet (64 mg total) by mouth 2 (two) times daily. Patient not taking: Reported on 06/28/2017 01/15/17   Susy Frizzle, MD  metoCLOPramide (REGLAN) 10 MG tablet TAKE 1 TABLET BY MOUTH FOUR TIMES DAILY. Patient not taking: Reported on 06/28/2017 03/13/17   Susy Frizzle, MD  Multiple Vitamin (MULTIVITAMIN WITH MINERALS) TABS tablet Take 1 tablet by mouth daily. Patient not taking: Reported on 06/28/2017 01/05/17   Hosie Poisson, MD  Multiple Vitamin (MULTIVITAMIN) capsule Take 1 capsule by mouth daily. 06/28/17   Payton Emerald, MD    Family History Family History  Problem Relation Age of Onset  . Cancer Mother 25       breast cancer  . Breast cancer Mother   . Diabetes Mother   . Emphysema Father   . Diabetes Sister     Social History Social History  Substance Use Topics  . Smoking status: Never Smoker  . Smokeless tobacco: Never Used  . Alcohol use No     Allergies   Patient has no known allergies.   Review of Systems Review of Systems  Constitutional: Negative for chills and fever.  HENT: Negative for ear pain and sore throat.   Eyes: Negative for pain and visual disturbance.  Respiratory: Negative for cough and shortness of breath.   Cardiovascular: Negative for chest pain and palpitations.  Gastrointestinal: Negative for abdominal pain and vomiting.  Genitourinary: Negative for dysuria and hematuria.  Musculoskeletal: Negative for  arthralgias and back pain.       Positive for subjective LLE swelling and intermittent BLE numbness  Skin: Negative for color change and rash.  Neurological: Negative for seizures and syncope.  All other systems reviewed and are negative.    Physical Exam Updated Vital Signs BP 109/78   Pulse 70   Temp 98.3 F (36.8 C) (Oral)   Resp 16   Ht 5\' 11"  (1.803 m)   Wt 104.3 kg (230 lb)   SpO2 99%   BMI 32.08 kg/m   Physical Exam  Constitutional: He appears well-developed and well-nourished.  HENT:  Head: Normocephalic and atraumatic.  Eyes: Conjunctivae are normal.  Neck: Neck supple.  Cardiovascular: Normal rate and regular rhythm.   No murmur heard. Pulmonary/Chest: Effort normal and breath sounds normal. No respiratory distress.  Abdominal: Soft. There is no tenderness.  Musculoskeletal: He exhibits no edema (no notable  edema).  Neurological: He is alert. No cranial nerve deficit. Coordination normal.  No focal neuro deficits. 5/5 motor strength and intact sensation in all extremities. Intact bilateral finger-to-nose coordination. Exquisitely sensitive and ticklelish when attempting Babinski reflexes bilaterally  Skin: Skin is warm and dry.  Nursing note and vitals reviewed.    ED Treatments / Results  Labs (all labs ordered are listed, but only abnormal results are displayed) Labs Reviewed  CBC WITH DIFFERENTIAL/PLATELET - Abnormal; Notable for the following:       Result Value   WBC 2.9 (*)    RBC 3.66 (*)    MCV 109.0 (*)    MCH 37.4 (*)    Neutro Abs 1.0 (*)    All other components within normal limits  BASIC METABOLIC PANEL - Abnormal; Notable for the following:    Glucose, Bld 135 (*)    BUN <5 (*)    Calcium 8.7 (*)    All other components within normal limits  BRAIN NATRIURETIC PEPTIDE  FOLATE  VITAMIN B12    EKG  EKG Interpretation None       Radiology No results found.  Procedures Procedures (including critical care time)  Medications  Ordered in ED Medications - No data to display   Initial Impression / Assessment and Plan / ED Course  I have reviewed the triage vital signs and the nursing notes.  Pertinent labs & imaging results that were available during my care of the patient were reviewed by me and considered in my medical decision making (see chart for details).  Clinical Course as of Jun 30 1751  Nancy Fetter Jun 29, 2017  1202 Vitamin B12: 209 [CI]    Clinical Course User Index [CI] Duffy Bruce, MD    40 yoM h/o type 2 diabetes, HTN, HLD, and gastroparesis who p/w subjective LLE swelling and BLE numbness. No evidence of unilateral LE swelling on exam. Recent OSH LLE DVT study was negative. Distal sensation intact on exam. No focal neuro deficits.  CBC showing macrocytosis (MCV 109). Labs otherwise unremarkable.  Question vitamin deficiency as patient with poor diet with chronic vomiting and drinking soda. Possible underlying diabetic neuropathy. Gabapentin and multivitamin rx provided. Instructed on close PCP f/u. Folate and B12 levels sent.  Return precautions provided for worsening symptoms. Pt will f/u with PCP at first availability. Pt verbalized agreement with plan.  Pt care d/w Dr. Ellender Hose  Final Clinical Impressions(s) / ED Diagnoses   Final diagnoses:  Diabetic polyneuropathy associated with type 2 diabetes mellitus (HCC)    New Prescriptions New Prescriptions   GABAPENTIN (NEURONTIN) 100 MG CAPSULE    Take 1 capsule (100 mg total) by mouth 3 (three) times daily.   MULTIPLE VITAMIN (MULTIVITAMIN) CAPSULE    Take 1 capsule by mouth daily.     Payton Emerald, MD 06/29/17 Gabriel Rung    Duffy Bruce, MD 06/30/17 [phone]

## 2017-06-28 NOTE — Discharge Instructions (Signed)
Please take gabapentin and multivitamin as directed. Please followup closely with primary doctor.

## 2017-06-29 NOTE — ED Provider Notes (Signed)
I saw and evaluated the patient, reviewed the resident's note and I agree with the findings and plan. I was present and provided direct supervision for all procedures. Please see associated encounter note. Briefly, the patient is a 51 yo M here with bilateral leg pain. He has had extensive w/u including recent neg vascular and arterial U/S in the last 1-2 weeks. No overt swelling on my exam. Of note, pt chronically leukopenic, anemic (though improved here) and with large MCV. Suspect peripheral neuropathy and he has been referred to neurologist. He reportedly has a very poor diet, so there may be a component of folate/B12 deficiency. Will send levels for PCP f/u, start on MVI and gabapentin.   EKG Interpretation None         Duffy Bruce, MD 06/29/17 1203

## 2017-07-09 ENCOUNTER — Other Ambulatory Visit: Payer: Self-pay | Admitting: Family Medicine

## 2017-07-09 MED ORDER — FOLIC ACID 1 MG PO TABS: 1.0000 mg | ORAL_TABLET | Freq: Every day | ORAL | 2 refills | Status: DC

## 2017-07-12 ENCOUNTER — Other Ambulatory Visit: Payer: Self-pay | Admitting: Family Medicine

## 2017-07-17 ENCOUNTER — Other Ambulatory Visit: Payer: Self-pay | Admitting: Family Medicine

## 2017-07-18 ENCOUNTER — Other Ambulatory Visit: Payer: Self-pay | Admitting: Family Medicine

## 2017-09-12 ENCOUNTER — Other Ambulatory Visit: Payer: Self-pay | Admitting: Family Medicine

## 2017-11-04 ENCOUNTER — Emergency Department (HOSPITAL_COMMUNITY)
Admission: EM | Admit: 2017-11-04 | Discharge: 2017-11-04 | Disposition: A | Discharge status: Home / Self Care | Payer: Medicare Other | Attending: Emergency Medicine | Admitting: Emergency Medicine

## 2017-11-04 ENCOUNTER — Emergency Department (HOSPITAL_COMMUNITY): Payer: Medicare Other

## 2017-11-04 ENCOUNTER — Other Ambulatory Visit: Payer: Self-pay

## 2017-11-04 ENCOUNTER — Encounter (HOSPITAL_COMMUNITY): Payer: Self-pay

## 2017-11-04 DIAGNOSIS — Z794 Long term (current) use of insulin: Secondary | ICD-10-CM | POA: Insufficient documentation

## 2017-11-04 DIAGNOSIS — Z79899 Other long term (current) drug therapy: Secondary | ICD-10-CM | POA: Diagnosis not present

## 2017-11-04 DIAGNOSIS — Z85038 Personal history of other malignant neoplasm of large intestine: Secondary | ICD-10-CM | POA: Insufficient documentation

## 2017-11-04 DIAGNOSIS — E119 Type 2 diabetes mellitus without complications: Secondary | ICD-10-CM | POA: Diagnosis not present

## 2017-11-04 DIAGNOSIS — R1031 Right lower quadrant pain: Secondary | ICD-10-CM | POA: Diagnosis not present

## 2017-11-04 DIAGNOSIS — R1084 Generalized abdominal pain: Secondary | ICD-10-CM | POA: Diagnosis present

## 2017-11-04 DIAGNOSIS — R7989 Other specified abnormal findings of blood chemistry: Secondary | ICD-10-CM | POA: Diagnosis not present

## 2017-11-04 DIAGNOSIS — K529 Noninfective gastroenteritis and colitis, unspecified: Secondary | ICD-10-CM | POA: Insufficient documentation

## 2017-11-04 DIAGNOSIS — I10 Essential (primary) hypertension: Secondary | ICD-10-CM | POA: Diagnosis not present

## 2017-11-04 DIAGNOSIS — R109 Unspecified abdominal pain: Secondary | ICD-10-CM | POA: Diagnosis not present

## 2017-11-04 DIAGNOSIS — E860 Dehydration: Secondary | ICD-10-CM | POA: Diagnosis not present

## 2017-11-04 LAB — COMPREHENSIVE METABOLIC PANEL
ALK PHOS: 131 U/L — AB (ref 38–126)
ALT: 33 U/L (ref 17–63)
AST: 28 U/L (ref 15–41)
Albumin: 4.5 g/dL (ref 3.5–5.0)
Anion gap: 9 (ref 5–15)
BUN: 10 mg/dL (ref 6–20)
CALCIUM: 10 mg/dL (ref 8.9–10.3)
CHLORIDE: 105 mmol/L (ref 101–111)
CO2: 16 mmol/L — ABNORMAL LOW (ref 22–32)
CREATININE: 1.76 mg/dL — AB (ref 0.61–1.24)
GFR calc Af Amer: 51 mL/min — ABNORMAL LOW (ref >60)
GFR, EST NON AFRICAN AMERICAN: 44 mL/min — AB (ref >60)
Glucose, Bld: 367 mg/dL — ABNORMAL HIGH (ref 65–99)
Potassium: 4.8 mmol/L (ref 3.5–5.1)
Sodium: 130 mmol/L — ABNORMAL LOW (ref 135–145)
Total Bilirubin: 0.8 mg/dL (ref 0.3–1.2)
Total Protein: 8.9 g/dL — ABNORMAL HIGH (ref 6.5–8.1)

## 2017-11-04 LAB — LIPASE, BLOOD: LIPASE: 42 U/L (ref 11–51)

## 2017-11-04 LAB — CBC
HCT: 53.4 % — ABNORMAL HIGH (ref 39.0–52.0)
Hemoglobin: 19.2 g/dL — ABNORMAL HIGH (ref 13.0–17.0)
MCH: 35 pg — AB (ref 26.0–34.0)
MCHC: 36 g/dL (ref 30.0–36.0)
MCV: 97.3 fL (ref 78.0–100.0)
Platelets: 206 10*3/uL (ref 150–400)
RBC: 5.49 MIL/uL (ref 4.22–5.81)
RDW: 12.4 % (ref 11.5–15.5)
WBC: 10.9 10*3/uL — AB (ref 4.0–10.5)

## 2017-11-04 LAB — URINALYSIS, ROUTINE W REFLEX MICROSCOPIC
BACTERIA UA: NONE SEEN
BILIRUBIN URINE: NEGATIVE
HGB URINE DIPSTICK: NEGATIVE
KETONES UR: NEGATIVE mg/dL
Leukocytes, UA: NEGATIVE
NITRITE: NEGATIVE
PH: 5 (ref 5.0–8.0)
Protein, ur: NEGATIVE mg/dL

## 2017-11-04 LAB — CBG MONITORING, ED: Glucose-Capillary: 253 mg/dL — ABNORMAL HIGH (ref 65–99)

## 2017-11-04 MED ORDER — IOPAMIDOL (ISOVUE-300) INJECTION 61%
INTRAVENOUS | Status: AC
  Administered 2017-11-04: 80 mL
  Filled 2017-11-04: qty 100

## 2017-11-04 MED ORDER — ONDANSETRON 4 MG PO TBDP
4.0000 mg | ORAL_TABLET | Freq: Once | ORAL | Status: AC
  Administered 2017-11-04: 4 mg via ORAL
  Filled 2017-11-04: qty 1

## 2017-11-04 MED ORDER — SODIUM CHLORIDE 0.9 % IV BOLUS (SEPSIS)
1000.0000 mL | Freq: Once | INTRAVENOUS | Status: AC
  Administered 2017-11-04: 1000 mL via INTRAVENOUS

## 2017-11-04 MED ORDER — ONDANSETRON 4 MG PO TBDP: 4.0000 mg | ORAL_TABLET | Freq: Three times a day (TID) | ORAL | 0 refills | Status: DC | PRN

## 2017-11-04 NOTE — ED Triage Notes (Signed)
Pt reports abdominal pain, loose dark stool, nausea since yesterday. Pt reports hx of colon ca with bowel resection in 2009. Abdomen distended.

## 2017-11-04 NOTE — ED Provider Notes (Signed)
[name] Provider Note   CSN: [id number] Arrival date & time: 11/04/17  1215     History   Chief Complaint Chief Complaint  Patient presents with  . Abdominal Pain    HPI [name] is a 52 y.o. male.  HPI   [name] is a 52yo male with a history of colon cancer (s/p colectomy 2009), cholecystectomy, kidney stones, hypertension, hyperlipidemia, type 2 diabetes who presents to the emergency [name] for evaluation of generalized abdominal pain.  Patient states that his pain began yesterday evening.  Pain begins in the left lower quadrant and radiates to the right lower quadrant.  He states the pain is "sharp and stabbing" and intermittent.  No known triggers to the pain.  Pain is 10/10 in severity at its worst and lasts a couple of minutes at a time but then completely resolves.  He states that he has associated nausea, no vomiting.  Also reports loose bowel movements and abdominal distanesion.  Denies fevers, chills, melena, hematochezia, dysuria, urinary frequency, hematuria, straining to pass urine, shortness of breath, chest pain, numbness, weakness, headache.  It is that he has had only [facility] to drink today given his nausea.  Past Medical History:  Diagnosis Date  . Cancer [facility]) carcinoid  . Colon cancer ([place])   . GERD (gastroesophageal reflux disease)   . Hyperlipemia   . Hypertension   . Kidney stones   . Shortness of breath    with exertion  . Type II diabetes mellitus ([place])   . UTI (lower urinary tract infection)   . Ventral hernia     Patient Active Problem List   Diagnosis Date Noted  . Weakness 01/02/2017  . Hypokalemia 01/02/2017  . Hypomagnesemia 01/02/2017  . Seroma complicating a procedure 02/21/2014  . Recurrent ventral incisional hernia 01/20/2014  . Gastroparesis 05/27/2012  . Nausea and vomiting 03/25/2012  . History of malignant carcinoid tumor 03/25/2012  . FINGER SPRAIN 03/19/2010  . GERD  09/07/2009  . ANKLE INJURY, LEFT 05/31/2009  . ABDOMINAL PAIN 05/19/2009  . HEMATURIA, HX OF 05/19/2009  . VENTRAL HERNIA, INCISIONAL 12/23/2008  . URINARY TRACT INFECTION, ACUTE 10/21/2008  . TINEA PEDIS 09/14/2008  . Secondary neuroendocrine tumors ([place]) 07/22/2008  . ABRASION 03/16/2008  . Type 2 diabetes mellitus with hyperlipidemia ([place]) 05/18/2007  . HYPERLIPIDEMIA 05/18/2007  . ANGER 05/18/2007  . Essential hypertension 05/18/2007    Past Surgical History:  Procedure Laterality Date  . ABDOMINAL HERNIA REPAIR  01/20/2014   recurrent VHR w/mesh  . CHOLECYSTECTOMY    . COLECTOMY  07/2008   "got all the cancer out"  . HERNIA REPAIR  02/2009; 01/20/2014   IHR; VHR  . INSERTION OF MESH N/A 01/20/2014   Procedure: INSERTION OF MESH;  Surgeon: [name]. [name], MD;  Location: [place];  Service: General;  Laterality: N/A;  . [place]   "had to open me up after they busted; cut just above pubis"  . VENTRAL HERNIA REPAIR N/A 01/20/2014   Procedure: OPEN REPAIR OF A RECURRENT VENTRAL HERNIA REPAIR;  Surgeon: [name]. [name], MD;  Location: [place] [facility];  Service: General;  Laterality: N/A;       Home Medications    Prior to Admission medications   Medication Sig Start Date End Date Taking? Authorizing Provider  acetaminophen (TYLENOL 8 HOUR ARTHRITIS PAIN) 650 MG CR tablet Take 650 mg by mouth every 8 (eight) hours as needed for pain.  Yes [provider]  folic acid (FOLVITE) 1 MG tablet Take 1 tablet (1 mg total) by mouth daily. 07/09/17  Yes Susy Frizzle, MD  gabapentin (NEURONTIN) 100 MG capsule Take 1 capsule (100 mg total) by mouth 3 (three) times daily. 06/28/17  Yes Mu, Pilar Plate, MD  Insulin Glargine (LANTUS SOLOSTAR) 100 UNIT/ML Solostar Pen Inject 40 Units into the skin daily. 01/09/17  Yes Susy Frizzle, MD  magnesium chloride (SLOW-MAG) 64 MG TBEC SR tablet Take 1 tablet (64 mg total) by mouth 2 (two) times daily. 01/15/17  Yes Susy Frizzle, MD   metoCLOPramide (REGLAN) 10 MG tablet TAKE 1 TABLET BY MOUTH FOUR TIMES DAILY. Patient taking differently: TAKE 1 TABLET (10mg ) BY MOUTH FOUR TIMES DAILY. 07/17/17  Yes Susy Frizzle, MD  ondansetron (ZOFRAN) 4 MG tablet Take 1 tablet (4 mg total) by mouth every 6 (six) hours as needed for nausea. 01/04/17  Yes Hosie Poisson, MD  pioglitazone (ACTOS) 30 MG tablet TAKE 1 TABLET BY MOUTH DAILY Patient taking differently: TAKE 30 mg TABLET BY MOUTH DAILY 06/19/17  Yes Susy Frizzle, MD  potassium chloride SA (K-DUR,KLOR-CON) 20 MEQ tablet Take 1 tablet (20 mEq total) by mouth daily. 05/08/17  Yes Susy Frizzle, MD  rosuvastatin (CRESTOR) 20 MG tablet TAKE 1 TABLET BY MOUTH DAILY Patient taking differently: TAKE 1 TABLET (20mg ) BY MOUTH DAILY 07/18/17  Yes Susy Frizzle, MD  feeding supplement, GLUCERNA SHAKE, (GLUCERNA SHAKE) LIQD Take 237 mLs by mouth 3 (three) times daily between meals. Patient not taking: Reported on 06/28/2017 01/04/17   Hosie Poisson, MD  Insulin Pen Needle 31G X 8 MM MISC Use daily with pen Patient not taking: Reported on 06/28/2017 06/27/16   Susy Frizzle, MD  Multiple Vitamin (MULTIVITAMIN) capsule Take 1 capsule by mouth daily. Patient not taking: Reported on 11/04/2017 06/28/17   Payton Emerald, MD    Family History Family History  Problem Relation Age of Onset  . Cancer Mother 19       breast cancer  . Breast cancer Mother   . Diabetes Mother   . Emphysema Father   . Diabetes Sister     Social History Social History   Tobacco Use  . Smoking status: Never Smoker  . Smokeless tobacco: Never Used  Substance Use Topics  . Alcohol use: No  . Drug use: No     Allergies   Patient has no known allergies.   Review of Systems Review of Systems  Constitutional: Negative for chills, fatigue and fever.  HENT: Negative for congestion.   Eyes: Negative for visual disturbance.  Respiratory: Negative for shortness of breath.   Cardiovascular:  Negative for chest pain.  Gastrointestinal: Positive for abdominal distention, abdominal pain, diarrhea and nausea. Negative for vomiting.  Genitourinary: Negative for difficulty urinating, dysuria, flank pain, frequency, hematuria, scrotal swelling and testicular pain.  Musculoskeletal: Negative for back pain and gait problem.  Skin: Negative for rash and wound.  Neurological: Negative for weakness, numbness and headaches.  Psychiatric/Behavioral: Negative for agitation.     Physical Exam Updated Vital Signs BP 118/78 (BP Location: Right Arm)   Pulse (!) 131   Temp 98.2 F (36.8 C) (Oral)   Resp 18   SpO2 97%   Physical Exam  Constitutional: He is oriented to person, place, and time. He appears well-developed and well-nourished. No distress.  Sitting at bedside in no apparent distress.  HENT:  Head: Normocephalic and atraumatic.  Mouth/Throat: Oropharynx is  clear and moist. No oropharyngeal exudate.  Eyes: Conjunctivae are normal. Pupils are equal, round, and reactive to light. Right eye exhibits no discharge. Left eye exhibits no discharge.  Neck: Normal range of motion. Neck supple.  Cardiovascular: Normal rate, regular rhythm and intact distal pulses. Exam reveals no friction rub.  No murmur heard. Pulmonary/Chest: Effort normal and breath sounds normal. No stridor. No respiratory distress. He has no wheezes. He has no rales.  Abdominal: Soft.  Abdomen soft.  Mild tenderness to palpation over left and right lower quadrants.  No guarding or rigidity.  No rebound tenderness.  No CVA tenderness.  Musculoskeletal: Normal range of motion.  Lymphadenopathy:    He has no cervical adenopathy.  Neurological: He is alert and oriented to person, place, and time. Coordination normal.  Skin: Skin is warm and dry. Capillary refill takes less than 2 seconds. He is not diaphoretic.  Psychiatric: He has a normal mood and affect. His behavior is normal.  Nursing note and vitals  reviewed.    ED Treatments / Results  Labs (all labs ordered are listed, but only abnormal results are displayed) Labs Reviewed  COMPREHENSIVE METABOLIC PANEL - Abnormal; Notable for the following components:      Result Value   Sodium 130 (*)    CO2 16 (*)    Glucose, Bld 367 (*)    Creatinine, Ser 1.76 (*)    Total Protein 8.9 (*)    Alkaline Phosphatase 131 (*)    GFR calc non Af Amer 44 (*)    GFR calc Af Amer 51 (*)    All other components within normal limits  CBC - Abnormal; Notable for the following components:   WBC 10.9 (*)    Hemoglobin 19.2 (*)    HCT 53.4 (*)    MCH 35.0 (*)    All other components within normal limits  URINALYSIS, ROUTINE W REFLEX MICROSCOPIC - Abnormal; Notable for the following components:   APPearance HAZY (*)    Specific Gravity, Urine >1.046 (*)    Glucose, UA >=500 (*)    Squamous Epithelial / LPF 0-5 (*)    All other components within normal limits  CBG MONITORING, ED - Abnormal; Notable for the following components:   Glucose-Capillary 253 (*)    All other components within normal limits  LIPASE, BLOOD    EKG  EKG Interpretation None       Radiology Ct Abdomen Pelvis W Contrast  Result Date: 11/04/2017 CLINICAL DATA:  Diffuse abdominal pain with nausea. History of colon cancer. EXAM: CT ABDOMEN AND PELVIS WITH CONTRAST TECHNIQUE: Multidetector CT imaging of the abdomen and pelvis was performed using the standard protocol following bolus administration of intravenous contrast. CONTRAST:  16mL ISOVUE-300 IOPAMIDOL (ISOVUE-300) INJECTION 61% COMPARISON:  CT abdomen pelvis dated December 24, 2016. FINDINGS: Lower chest: No acute abnormality. Hepatobiliary: No focal liver abnormality is seen. Status post cholecystectomy. No biliary dilatation. Pancreas: Unremarkable. No pancreatic ductal dilatation or surrounding inflammatory changes. Spleen: Normal in size without focal abnormality. Adrenals/Urinary Tract: The adrenal glands are  unremarkable. Unchanged 2.4 cm simple cyst in the lower pole the left kidney. No renal or ureteral calculi. No hydronephrosis. Contracted urinary bladder with moderate circumferential wall thickening. Stomach/Bowel: The stomach is within normal limits. There are few mildly prominent loops of small bowel with minimal wall thickening in the right abdomen proximal to the anastomosis. No evidence of bowel obstruction. Postsurgical changes related to prior right hemicolectomy. Liquid stool is seen throughout the remaining  colon. No bowel wall thickening or surrounding inflammatory changes. Vascular/Lymphatic: Mild aortic atherosclerosis. No enlarged abdominal or pelvic lymph nodes. Reproductive: Prostate is unremarkable. Other: No abdominal wall hernia or abnormality. No abdominopelvic ascites. No pneumoperitoneum. Musculoskeletal: No acute or significant osseous findings. IMPRESSION: 1. Postsurgical changes from prior right hemicolectomy. There are a few mildly prominent loops of small bowel with minimal wall thickening in the right abdomen near the anastomosis, with liquid stool noted throughout the remaining colon. Findings are favored to reflect enterocolitis. 2.  Aortic atherosclerosis (ICD10-I70.0). Electronically Signed   By: Titus Dubin [name].   On: 11/04/2017 17:07    Procedures Procedures (including critical care time)  Medications Ordered in ED Medications  ondansetron (ZOFRAN-ODT) disintegrating tablet 4 mg (4 mg Oral Given 11/04/17 1422)  sodium chloride 0.9 % bolus 1,000 mL (0 mLs Intravenous Stopped 11/04/17 1908)  iopamidol (ISOVUE-300) 61 % injection (80 mLs  Contrast Given 11/04/17 1638)  sodium chloride 0.9 % bolus 1,000 mL (0 mLs Intravenous Stopped 11/04/17 1908)     Initial Impression / Assessment and Plan / ED Course  I have reviewed the triage vital signs and the nursing notes.  Pertinent labs & imaging results that were available during my care of the patient were reviewed by me  and considered in my medical decision making (see chart for details).    Patient presents with intermittent sharp abdominal pain which began yesterday.  He endorses diarrhea and nausea, no vomiting.  On exam he is nontoxic and in no acute distress.  He is afebrile and vital signs stable.  Mild tenderness to palpation in both left lower quadrant and right lower quadrant.  No peritoneal signs.  Results reviewed.  CBC reveals mild leukocytosis of 10.9. CMP reveals glucose elevated at 367, he is a known diabetic. Lipase negative. Labs suggest dehydration given Hgb elevated at 19.2, mild hyponatremia Na 130 and hyaline casts on UA.  This is consistent with presentation of poor p.o. intake today.    He also has an AKI with Creatinine 1.76 (baseline 0.65). He also has an anion gap acidosis bicarb of 16, suspect related to patient's recent diarrhea. 2L fluid bolus and zofran started  CT scan reveals findings suggestive of enterocolitis. This is consistent with history of abdominal pain, nausea and diarrhea. Discussed this patient with Dr. Tyrone Nine who agrees that this likely viral.   On recheck, patient is tolerating po fluids at the bedside. Repeat abdominal exam soft without guarding or rebound. Discussed patient's CT scan and lab work, particularly his reduced kidney function. Engaged in shared decision with patient who states that he does not want to be admitted to the hospital for AKI. Would prefer to go home and drink po fluids and follow up with his PCP for kidney function recheck in the next 3days. He appears reliable to follow up. Discussed return precautions and patient agrees and voices understanding to the above plan. Discussed this patient with Dr. Tyrone Nine who agrees with plan for discharge and close PCP follow up to recheck kidney function.   Final Clinical Impressions(s) / ED Diagnoses   Final diagnoses:  Enterocolitis  Elevated serum creatinine    ED Discharge Orders        Ordered     ondansetron (ZOFRAN ODT) 4 MG disintegrating tablet  Every 8 hours PRN     11/04/17 1938       Glyn Ade, PA-C 11/05/17 0016    Deno Etienne, DO 11/05/17 1501

## 2017-11-04 NOTE — ED Notes (Signed)
Pt returned from CT °

## 2017-11-04 NOTE — ED Notes (Signed)
Pt given ice water. Family in room.

## 2017-11-04 NOTE — Discharge Instructions (Signed)
Your scan showed inflammation of your small intestines (enterocolitis.)  Please eat bland food, avoid spicy foods and greasy foods.  Please make an appointment with your regular doctor for recheck of your kidney function in the next 3 days. I have attached the blood work that he will need to have rechecked to your discharge paperwork.  Your blood sugar was also elevated in the ER today, please have this rechecked with your regular doctor as well.  Drink plenty of fluids when you get home.  I written you a medicine for nausea if you need it.  Return to the ER if you have vomiting that will not stop, fever greater than 100.4 F or your abdominal pain worsens or changes in any way.

## 2017-11-04 NOTE — ED Notes (Signed)
Pt & family made aware to notify this tech when Pt produces urine for UA.

## 2017-11-04 NOTE — ED Notes (Signed)
Patient transported to CT 

## 2017-11-04 NOTE — ED Provider Notes (Signed)
Patient placed in Quick Look pathway, seen and evaluated   Chief Complaint: Abdominal pain  HPI:   Patient presents for evaluation of acute onset, intermittent sharp generalized abdominal pains.  No aggravating or alleviating factors.  Also notes nausea but no emesis.  No fevers or chills.  Has had multiple episodes of black loose stools.  History of colon cancer with bowel resection in 2009.  No known sick contacts, denies suspicious food intake.  ROS: +abd pain +diarrhea +nausea -urinary symptoms -vomiting -fevers   Physical Exam:   Gen: No distress  Neuro: Awake and Alert  Skin: Warm    Focused Exam: Abdomen soft but obese, bowel sounds present but hypoactive.  Diffuse tenderness to palpation with no focal tenderness.  No rebound.  Murphy sign absent, Rovsing's absent.  No CVA tenderness.  Tachycardic.   Initiation of care has begun. The patient has been counseled on the process, plan, and necessity for staying for the completion/evaluation, and the remainder of the medical screening examination    Debroah Baller 11/04/17 1421    Carmin Muskrat, MD 11/04/17 [phone]

## 2017-11-06 ENCOUNTER — Other Ambulatory Visit: Payer: Self-pay | Admitting: Family Medicine

## 2017-11-10 ENCOUNTER — Other Ambulatory Visit: Payer: Self-pay

## 2017-11-10 DIAGNOSIS — Z859 Personal history of malignant neoplasm, unspecified: Secondary | ICD-10-CM

## 2017-11-11 ENCOUNTER — Other Ambulatory Visit: Payer: Medicaid Other

## 2017-11-11 ENCOUNTER — Ambulatory Visit: Payer: Medicaid Other | Admitting: Hematology and Oncology

## 2017-11-11 NOTE — Progress Notes (Deleted)
Patient Care Team: Susy Frizzle, MD as PCP - General (Family Medicine)  DIAGNOSIS:  Encounter Diagnosis  Name Primary?  . Secondary neuroendocrine tumors ([place])     SUMMARY OF ONCOLOGIC HISTORY:   Secondary neuroendocrine tumors ([place])   07/24/2005 Surgery    Ileum resection for neuroendocrine carcinoid tumor       CHIEF COMPLIANT: Follow-up of carcinoid tumor  INTERVAL HISTORY: [name] is a 52 year old gentleman with a history of ileal resection for neuroendocrine carcinoid tumor who is here for a routine follow-up.  He has had chronic abdominal pain since his surgery.  He had CT scans that did not show any evidence of malignancy.  Most recent CT scan was done on 11/04/2017 which seems to have findings suggestive of enterocolitis.  REVIEW OF SYSTEMS:   Constitutional: Denies fevers, chills or abnormal weight loss Eyes: Denies blurriness of vision Ears, nose, mouth, throat, and face: Denies mucositis or sore throat Respiratory: Denies cough, dyspnea or wheezes Cardiovascular: Denies palpitation, chest discomfort Gastrointestinal: Abdominal pain and diarrhea Skin: Denies abnormal skin rashes Lymphatics: Denies new lymphadenopathy or easy bruising Neurological:Denies numbness, tingling or new weaknesses Behavioral/Psych: Mood is stable, no new changes  Extremities: No lower extremity edema All other systems were reviewed with the patient and are negative.  I have reviewed the past medical history, past surgical history, social history and family history with the patient and they are unchanged from previous note.  ALLERGIES:  has No Known Allergies.  MEDICATIONS:  Current Outpatient Medications  Medication Sig Dispense Refill  . acetaminophen (TYLENOL 8 HOUR ARTHRITIS PAIN) 650 MG CR tablet Take 650 mg by mouth every 8 (eight) hours as needed for pain.    . feeding supplement, GLUCERNA SHAKE, (GLUCERNA SHAKE) LIQD Take 237 mLs by mouth 3 (three) times daily  between meals. (Patient not taking: Reported on 27/02/2375)  0  . folic acid (FOLVITE) 1 MG tablet Take 1 tablet (1 mg total) by mouth daily. 30 tablet 2  . gabapentin (NEURONTIN) 100 MG capsule Take 1 capsule (100 mg total) by mouth 3 (three) times daily. 90 capsule 0  . Insulin Glargine (LANTUS SOLOSTAR) 100 UNIT/ML Solostar Pen Inject 40 Units into the skin daily. 20 mL 5  . Insulin Pen Needle 31G X 8 MM MISC Use daily with pen (Patient not taking: Reported on 06/28/2017) 100 each 3  . magnesium chloride (SLOW-MAG) 64 MG TBEC SR tablet Take 1 tablet (64 mg total) by mouth 2 (two) times daily. 60 tablet 3  . metoCLOPramide (REGLAN) 10 MG tablet TAKE 1 TABLET BY MOUTH FOUR TIMES DAILY. (Patient taking differently: TAKE 1 TABLET (10mg ) BY MOUTH FOUR TIMES DAILY.) 120 tablet 0  . Multiple Vitamin (MULTIVITAMIN) capsule Take 1 capsule by mouth daily. (Patient not taking: Reported on 11/04/2017) 30 capsule 0  . ondansetron (ZOFRAN ODT) 4 MG disintegrating tablet Take 1 tablet (4 mg total) by mouth every 8 (eight) hours as needed for nausea or vomiting. 20 tablet 0  . ondansetron (ZOFRAN) 4 MG tablet Take 1 tablet (4 mg total) by mouth every 6 (six) hours as needed for nausea. 20 tablet 0  . pioglitazone (ACTOS) 30 MG tablet TAKE 1 TABLET BY MOUTH DAILY 30 tablet 0  . potassium chloride SA (K-DUR,KLOR-CON) 20 MEQ tablet Take 1 tablet (20 mEq total) by mouth daily. 90 tablet 3  . rosuvastatin (CRESTOR) 20 MG tablet TAKE 1 TABLET BY MOUTH DAILY (Patient taking differently: TAKE 1 TABLET (20mg ) BY MOUTH DAILY) 90  tablet 0   No current facility-administered medications for this visit.     PHYSICAL EXAMINATION: ECOG PERFORMANCE STATUS: 1 - Symptomatic but completely ambulatory  There were no vitals filed for this visit. There were no vitals filed for this visit.  GENERAL:alert, no distress and comfortable SKIN: skin color, texture, turgor are normal, no rashes or significant lesions EYES: normal,  Conjunctiva are pink and non-injected, sclera clear OROPHARYNX:no exudate, no erythema and lips, buccal mucosa, and tongue normal  NECK: supple, thyroid normal size, non-tender, without nodularity LYMPH:  no palpable lymphadenopathy in the cervical, axillary or inguinal LUNGS: clear to auscultation and percussion with normal breathing effort HEART: regular rate & rhythm and no murmurs and no lower extremity edema ABDOMEN:abdomen soft, non-tender and normal bowel sounds MUSCULOSKELETAL:no cyanosis of digits and no clubbing  NEURO: alert & oriented x 3 with fluent speech, no focal motor/sensory deficits EXTREMITIES: No lower extremity edema  LABORATORY DATA:  I have reviewed the data as listed CMP Latest Ref Rng & Units 11/04/2017 06/28/2017 03/03/2017  Glucose 65 - 99 mg/dL 367(H) 135(H) 223(H)  BUN 6 - 20 mg/dL 10 <5(L) 3(L)  Creatinine 0.61 - 1.24 mg/dL 1.76(H) 0.64 0.69  Sodium 135 - 145 mmol/L 130(L) 135 138  Potassium 3.5 - 5.1 mmol/L 4.8 3.6 3.8  Chloride 101 - 111 mmol/L 105 102 102  CO2 22 - 32 mmol/L 16(L) 26 25  Calcium 8.9 - 10.3 mg/dL 10.0 8.7(L) 9.0  Total Protein 6.5 - 8.1 g/dL 8.9(H) - 6.4  Total Bilirubin 0.3 - 1.2 mg/dL 0.8 - 0.7  Alkaline Phos 38 - 126 U/L 131(H) - 64  AST 15 - 41 U/L 28 - 15  ALT 17 - 63 U/L 33 - 14    Lab Results  Component Value Date   WBC 10.9 (H) 11/04/2017   HGB 19.2 (H) 11/04/2017   HCT 53.4 (H) 11/04/2017   MCV 97.3 11/04/2017   PLT 206 11/04/2017   NEUTROABS 1.0 (L) 06/28/2017    ASSESSMENT & PLAN:  Secondary neuroendocrine tumors ([place]) Neuroendocrine carcinoid tumor of the ileium that was diagnosed in 2006. He is s/p resection and has no signs of recurrence.   CT abdomen pelvis 11/04/2017: Postsurgical changes from prior right hemicolectomy few mild prominent loops of small bowel with minimal wall thickening near the anastomosis.  Findings favored to reflect enterocolitis  Blood work with CBC CMP and chromogranin A were obtained  today. Return to clinic in 1 year with labs and follow-up    I spent 25 minutes talking to the patient of which more than half was spent in counseling and coordination of care.  No orders of the defined types were placed in this encounter.  The patient has a good understanding of the overall plan. he agrees with it. he will call with any problems that may develop before the next visit here.   Harriette Ohara, MD 11/11/17

## 2017-11-11 NOTE — Assessment & Plan Note (Deleted)
Neuroendocrine carcinoid tumor of the ileium that was diagnosed in 2006. He is s/p resection and has no signs of recurrence.   CT abdomen pelvis 11/04/2017: Postsurgical changes from prior right hemicolectomy few mild prominent loops of small bowel with minimal wall thickening near the anastomosis.  Findings favored to reflect enterocolitis  Blood work with CBC CMP and chromogranin A were obtained today. Return to clinic in 1 year with labs and follow-up

## 2017-12-09 ENCOUNTER — Ambulatory Visit (INDEPENDENT_AMBULATORY_CARE_PROVIDER_SITE_OTHER): Payer: Medicare Other | Admitting: Family Medicine

## 2017-12-09 ENCOUNTER — Encounter: Payer: Self-pay | Admitting: Family Medicine

## 2017-12-09 VITALS — BP 126/80 | HR 78 | Temp 97.5°F | Resp 18 | Ht 71.0 in | Wt 262.0 lb

## 2017-12-09 DIAGNOSIS — Z794 Long term (current) use of insulin: Secondary | ICD-10-CM

## 2017-12-09 DIAGNOSIS — E1165 Type 2 diabetes mellitus with hyperglycemia: Secondary | ICD-10-CM

## 2017-12-09 MED ORDER — PEN NEEDLES 32G X 4 MM MISC: 1.0000 | Freq: Every day | 5 refills | Status: DC

## 2017-12-09 MED ORDER — INSULIN GLARGINE 100 UNIT/ML SOLOSTAR PEN: 30.0000 [IU] | PEN_INJECTOR | Freq: Every day | SUBCUTANEOUS | 5 refills | Status: DC

## 2017-12-09 MED ORDER — EMPAGLIFLOZIN 25 MG PO TABS: 25.0000 mg | ORAL_TABLET | Freq: Every day | ORAL | 11 refills | Status: DC

## 2017-12-09 NOTE — Progress Notes (Signed)
Subjective:    Patient ID: [name], male    DOB: Jan 03, 1966, 52 y.o.   MRN: [id number]  HPI  12/30/16 Patient is a 52 year old white male with a history of secondary neuroendocrine tumor of the gastrointestinal tract. Referred here by his oncologist of her concerns about weight loss. 20 to the patient, his oncologist 1 him to see a gastroenterologist for colonoscopy. I last saw the patient may of 2017. Since that visit, he has lost 21 pounds by my scales. He states that he has no appetite. The smell of ffood makes him extremely nauseated. As soon as he eats, he becomes full very quickly and feels like he has to throw up. His oncologist perform a CT scan of the abdomen and pelvis which revealed mild diffuse nonspecific wall thickening of the intestines but no other abnormality was seen. He also perform lab work revealed hypokalemia with potassium of 3.1 and a bicarbonate greater than 30 suggesting significant metabolic alkalosis. Patient denies any ingestion. Last saw the patient in May of last year, his hemoglobin A1c was 6.6 indicating adequate control. He is currently on metformin. He also takes insulin however he doses himself haphazardly as he sees fit and is therefore on no standard dose of insulin. He is also supposed to be taking metoclopramide for nausea and vomiting suspected gastroparesis. He is only taking metoclopramide once a day because it makes him sleepy.  At that time, my plan was: I certainly agree with a GI referral for colonoscopy given his history of cancer. However I'll also schedule a gastric emptying study as I'm concerned the patient may have gastroparesis particular given his long history of uncontrolled diabetes mellitus. Given his weight loss, I will also check a TSH. I will temporarily have the patient discontinue metformin due to his potential GI upset. I'll also evaluate the patient by repeating a CMP to determine if his metabolic colic alkalosis persist. I will also  recheck his potassium. Causes of metabolic alkalosis include severe hypokalemia, contraction alkalosis particularly given his poor by mouth intake.  He denies laxative abuse.  He is not taking any loop or thiazide diuretic.  01/13/17 Patient was found to have severe metabolic alkalosis with a bicarbonate level greater than 35. He was also found to be hypokalemic and also have a low magnesium.  Unfortunately, before he can start outpatient replacement, he was admitted the hospital due to weakness in his legs. He states that his legs simply fell asleep on him and his muscles would not work. In the hospital he was given potassium and magnesium and was discharged home. Gastric emptying study performed in the hospital revealed no evidence of gastroparesis. He is currently taking potassium but is on no magnesium. He is also not taking anything to address his metabolic alkalosis. He states that his gastrointestinal difficulties have improved after potassium and magnesium however he continues to have weakness in the muscles of his legs bilaterally.  At that time, my plan was: I truly believe that the patient's electrolyte abnormalities of the cause of all of his symptoms. I had the patient discontinue metformin due to an elevated lactic acid level in his blood as well as a gastrointestinal side effects and the fact his hemoglobin A1c was well controlled. He is currently taking Lantus insulin and his fasting blood sugars are around 130 per his report today. Therefore this is sufficient. I want him to continue potassium. I will recheck his potassium and his magnesium level. I suspect the patient  will need chronic magnesium supplementation as well. I believe once we get the potassium and magnesium regulated, his muscle weakness in his legs will improve. I will also recheck his bicarbonate level and if still elevated, the patient will likely benefit from acetazolamide for metabolic alkalosis  7/86/76 No visits with results  within 2 Week(s) from this visit.  Latest known visit with results is:  Admission on 11/04/2017, Discharged on 11/04/2017  Component Date Value Ref Range Status  . Lipase 11/04/2017 42  11 - 51 U/L Final   Performed at [facility], [place] [address], [place], [place] [address]  . Sodium 11/04/2017 130* 135 - 145 mmol/L Final  . Potassium 11/04/2017 4.8  3.5 - 5.1 mmol/L Final  . Chloride 11/04/2017 105  101 - 111 mmol/L Final  . CO2 11/04/2017 16* 22 - 32 mmol/L Final  . Glucose, Bld 11/04/2017 367* 65 - 99 mg/dL Final  . BUN 11/04/2017 10  6 - 20 mg/dL Final  . Creatinine, Ser 11/04/2017 1.76* 0.61 - 1.24 mg/dL Final  . Calcium 11/04/2017 10.0  8.9 - 10.3 mg/dL Final  . Total Protein 11/04/2017 8.9* 6.5 - 8.1 g/dL Final  . Albumin 11/04/2017 4.5  3.5 - 5.0 g/dL Final  . AST 11/04/2017 28  15 - 41 U/L Final  . ALT 11/04/2017 33  17 - 63 U/L Final  . Alkaline Phosphatase 11/04/2017 131* 38 - 126 U/L Final  . Total Bilirubin 11/04/2017 0.8  0.3 - 1.2 mg/dL Final  . GFR calc non Af Amer 11/04/2017 44* >60 mL/min Final  . GFR calc Af Amer 11/04/2017 51* >60 mL/min Final   Comment: (NOTE) The eGFR has been calculated using the CKD EPI equation. This calculation has not been validated in all clinical situations. eGFR's persistently <60 mL/min signify possible Chronic Kidney Disease.   Georgiann Hahn gap 11/04/2017 9  5 - 15 Final   Performed at [facility], [place] [address], [place], [place] [address]  . WBC 11/04/2017 10.9* 4.0 - 10.5 K/uL Final  . RBC 11/04/2017 5.49  4.22 - 5.81 MIL/uL Final  . Hemoglobin 11/04/2017 19.2* 13.0 - 17.0 g/dL Final  . HCT 11/04/2017 53.4* 39.0 - 52.0 % Final  . MCV 11/04/2017 97.3  78.0 - 100.0 fL Final  . MCH 11/04/2017 35.0* 26.0 - 34.0 pg Final  . MCHC 11/04/2017 36.0  30.0 - 36.0 g/dL Final  . RDW 11/04/2017 12.4  11.5 - 15.5 % Final  . Platelets 11/04/2017 206  150 - 400 K/uL Final   Performed at [facility], [place] [address]  [place], [place] [address]  . Color, Urine 11/04/2017 YELLOW  YELLOW Final  . APPearance 11/04/2017 HAZY* CLEAR Final  . Specific Gravity, Urine 11/04/2017 >1.046* 1.005 - 1.030 Final  . pH 11/04/2017 5.0  5.0 - 8.0 Final  . Glucose, UA 11/04/2017 >=500* NEGATIVE mg/dL Final  . Hgb urine dipstick 11/04/2017 NEGATIVE  NEGATIVE Final  . Bilirubin Urine 11/04/2017 NEGATIVE  NEGATIVE Final  . Ketones, ur 11/04/2017 NEGATIVE  NEGATIVE mg/dL Final  . Protein, ur 11/04/2017 NEGATIVE  NEGATIVE mg/dL Final  . Nitrite 11/04/2017 NEGATIVE  NEGATIVE Final  . Leukocytes, UA 11/04/2017 NEGATIVE  NEGATIVE Final  . RBC / HPF 11/04/2017 6-30  0 - 5 RBC/hpf Final  . WBC, UA 11/04/2017 6-30  0 - 5 WBC/hpf Final  . Bacteria, UA 11/04/2017 NONE SEEN  NONE SEEN Final  . Squamous Epithelial / LPF 11/04/2017 0-5* NONE  SEEN Final  . Mucus 11/04/2017 PRESENT   Final  . Hyaline Casts, UA 11/04/2017 PRESENT   Final  . Granular Casts, UA 11/04/2017 PRESENT   Final   Performed at [facility], [place] [address], [place], [place] [address]  . Glucose-Capillary 11/04/2017 253* 65 - 99 mg/dL Final    Commended that the patient take Slow-Mag and continue potassium for 1 week and then allow [name] to recheck his electrolytes. He is here today for follow-up albeit a little bit later.  He feels much better since his electrolytes have normalized and his bicarbonate level is back to normal. The intestinal problems that he originally presented with are much better. He is now tolerating by mouth without difficulty. He denies any muscle weakness. His biggest complaint now is pain in his right knee that occurred after the fall. I reviewed the x-ray findings from the hospital in April. X-ray on April 26 reveal no fracture in the knee. He does have a small joint effusion and a bruise over the anterior tibial tubercle. He declines a cortisone injection in the knee today to treat in the spring. He believes that he would like to give it more time  to resolve on its own.  At that time,my plan was: The majority of his symptoms are back to his baseline now. Recheck potassium, magnesium, and bicarbonate to determine the length of repletion that is necessary. I believe the pain in his knee is secondary to a knee sprain that occurred when he fell. However he declines a cortisone injection for that today. He can take over-the-counter anti-inflammatories for pain if necessary  12/09/17 Patient has not been seen since.  Apparently he stopped his insulin more than a month ago.  This was done simply because he does not have needles.  He has Lantus pens at home but no needles.  He has no transportation to get the needles.  However he has been to the hospital several times according to the patient recently because his legs are swelling.  He denies any shortness of breath.  He denies any chest pain.  On exam today, there is trace pretibial edema bilaterally.  There is no substantial swelling that can be seen.  However the patient is extremely concerned about this.  He states that no one can tell him what is wrong.  Of note he is on Actos for his diabetes.  I reviewed the CT scan that was obtained at the hospital in March.  Also reviewed his lab work which was significant for sugars greater than 300 and profound glucosuria.  I explained to the patient that we must control his sugars, however he continues to fixate on the leg swelling.  Past Medical History:  Diagnosis Date  . Cancer [facility]) carcinoid  . Colon cancer ([place])   . GERD (gastroesophageal reflux disease)   . Hyperlipemia   . Hypertension   . Kidney stones   . Shortness of breath    with exertion  . Type II diabetes mellitus ([place])   . UTI (lower urinary tract infection)   . Ventral hernia    Past Surgical History:  Procedure Laterality Date  . ABDOMINAL HERNIA REPAIR  01/20/2014   recurrent VHR w/mesh  . CHOLECYSTECTOMY    . COLECTOMY  07/2008   "got all the cancer out"  . HERNIA REPAIR   02/2009; 01/20/2014   IHR; VHR  . INSERTION OF MESH N/A 01/20/2014   Procedure: INSERTION OF MESH;  Surgeon: [name].  [name], MD;  Location: [facility];  Service: General;  Laterality: N/A;  . [place]   "had to open me up after they busted; cut just above pubis"  . VENTRAL HERNIA REPAIR N/A 01/20/2014   Procedure: OPEN REPAIR OF A RECURRENT VENTRAL HERNIA REPAIR;  Surgeon: [name]. [name], MD;  Location: [facility];  Service: General;  Laterality: N/A;   Current Outpatient Medications on File Prior to Visit  Medication Sig Dispense Refill  . acetaminophen (TYLENOL 8 HOUR ARTHRITIS PAIN) 650 MG CR tablet Take 650 mg by mouth every 8 (eight) hours as needed for pain.    . folic acid (FOLVITE) 1 MG tablet Take 1 tablet (1 mg total) by mouth daily. 30 tablet 2  . gabapentin (NEURONTIN) 100 MG capsule Take 1 capsule (100 mg total) by mouth 3 (three) times daily. 90 capsule 0  . metoCLOPramide (REGLAN) 10 MG tablet TAKE 1 TABLET BY MOUTH FOUR TIMES DAILY. (Patient taking differently: TAKE 1 TABLET (67m) BY MOUTH FOUR TIMES DAILY.) 120 tablet 0  . ondansetron (ZOFRAN) 4 MG tablet Take 4 mg by mouth every 8 (eight) hours as needed for nausea or vomiting.    . pioglitazone (ACTOS) 30 MG tablet TAKE 1 TABLET BY MOUTH DAILY 30 tablet 0  . rosuvastatin (CRESTOR) 20 MG tablet TAKE 1 TABLET BY MOUTH DAILY (Patient taking differently: TAKE 1 TABLET (271m BY MOUTH DAILY) 90 tablet 0  . magnesium chloride (SLOW-MAG) 64 MG TBEC SR tablet Take 1 tablet (64 mg total) by mouth 2 (two) times daily. (Patient not taking: Reported on 12/09/2017) 60 tablet 3  . potassium chloride SA (K-DUR,KLOR-CON) 20 MEQ tablet Take 1 tablet (20 mEq total) by mouth daily. (Patient not taking: Reported on 12/09/2017) 90 tablet 3   No current facility-administered medications on file prior to visit.    No Known Allergies Social History   Socioeconomic History  . Marital status: Single    Spouse name: Not on file  . Number of  children: Not on file  . Years of education: Not on file  . Highest education level: Not on file  Occupational History  . Not on file  Social Needs  . Financial resource strain: Not on file  . Food insecurity:    Worry: Not on file    Inability: Not on file  . Transportation needs:    Medical: Not on file    Non-medical: Not on file  Tobacco Use  . Smoking status: Never Smoker  . Smokeless tobacco: Never Used  Substance and Sexual Activity  . Alcohol use: No  . Drug use: No  . Sexual activity: Not Currently  Lifestyle  . Physical activity:    Days per week: Not on file    Minutes per session: Not on file  . Stress: Not on file  Relationships  . Social connections:    Talks on phone: Not on file    Gets together: Not on file    Attends religious service: Not on file    Active member of club or organization: Not on file    Attends meetings of clubs or organizations: Not on file    Relationship status: Not on file  . Intimate partner violence:    Fear of current or ex partner: Not on file    Emotionally abused: Not on file    Physically abused: Not on file    Forced sexual activity: Not on file  Other Topics Concern  . Not  on file  Social History Narrative  . Not on file   Family History  Problem Relation Age of Onset  . Cancer Mother 67       breast cancer  . Breast cancer Mother   . Diabetes Mother   . Emphysema Father   . Diabetes Sister     Review of Systems  All other systems reviewed and are negative.      Objective:   Physical Exam  Constitutional: He is oriented to person, place, and time. He appears well-developed and well-nourished. No distress.  HENT:  Head: Normocephalic and atraumatic.  Right Ear: External ear normal.  Left Ear: External ear normal.  Nose: Nose normal.  Mouth/Throat: Oropharynx is clear and moist. No oropharyngeal exudate.  Eyes: Pupils are equal, round, and reactive to light. Conjunctivae and EOM are normal. Right eye  exhibits no discharge. Left eye exhibits no discharge. No scleral icterus.  Neck: Normal range of motion. Neck supple. No JVD present. No tracheal deviation present. No thyromegaly present.  Cardiovascular: Normal rate, regular rhythm, normal heart sounds and intact distal pulses. Exam reveals no gallop and no friction rub.  No murmur heard. Pulmonary/Chest: Effort normal and breath sounds normal. No stridor. No respiratory distress. He has no wheezes. He has no rales. He exhibits no tenderness.  Abdominal: Soft. Bowel sounds are normal. He exhibits no distension and no mass. There is no tenderness. There is no rebound and no guarding.  Musculoskeletal: Normal range of motion. He exhibits no edema or tenderness.  Lymphadenopathy:    He has no cervical adenopathy.  Neurological: He is alert and oriented to person, place, and time. He has normal reflexes. No cranial nerve deficit. He exhibits normal muscle tone. Coordination normal.  Skin: Skin is warm. No rash noted. He is not diaphoretic. No erythema. No pallor.  Psychiatric: He has a normal mood and affect. His behavior is normal. Judgment and thought content normal.  Vitals reviewed.         Assessment & Plan:  Uncontrolled type 2 diabetes mellitus with hyperglycemia, with long-term current use of insulin ([place]) - Plan: CBC with Differential/Platelet, COMPLETE METABOLIC PANEL WITH GFR, Lipid panel, Microalbumin, urine, Hemoglobin A1c  I will obtain his baseline fasting labs today including a hemoglobin A1c which will reflect his sugar control for the last 3 months and also show what is happened over the last month off insulin.  I anticipate a hemoglobin A1c greater than 12.  I will also check a fasting lipid panel, urine microalbumin as well.  I explained to the patient that the leg swelling is likely coming from the Actos.  He therefore wants to stop the medication.  I explained to him that he must then get on something else to control his  sugars to prevent diabetic complications and hospitalization.  Therefore I gave him samples I had of Zeb Comfort which also includes needles until he can get to the pharmacy to get his needles for his Lantus.  He can take the equivalent dose of 30 units a day which is what he was taking of Lantus until he can get to the pharmacy.  I also want him to replace Actos with Jardiance 25 mg a day which I think will help control his sugars and account for the change in medication as the patient now refuses to take Actos due to the leg swelling

## 2017-12-10 LAB — CBC WITH DIFFERENTIAL/PLATELET
Basophils Absolute: 31 cells/uL (ref 0–200)
Basophils Relative: 0.7 %
EOS ABS: 141 {cells}/uL (ref 15–500)
Eosinophils Relative: 3.2 %
HCT: 47.7 % (ref 38.5–50.0)
Hemoglobin: 16.6 g/dL (ref 13.2–17.1)
Lymphs Abs: 1712 cells/uL (ref 850–3900)
MCH: 33.2 pg — AB (ref 27.0–33.0)
MCHC: 34.8 g/dL (ref 32.0–36.0)
MCV: 95.4 fL (ref 80.0–100.0)
MPV: 12.8 fL — ABNORMAL HIGH (ref 7.5–12.5)
Monocytes Relative: 7.3 %
NEUTROS PCT: 49.9 %
Neutro Abs: 2196 cells/uL (ref 1500–7800)
PLATELETS: 158 10*3/uL (ref 140–400)
RBC: 5 10*6/uL (ref 4.20–5.80)
RDW: 12 % (ref 11.0–15.0)
TOTAL LYMPHOCYTE: 38.9 %
WBC: 4.4 10*3/uL (ref 3.8–10.8)
WBCMIX: 321 {cells}/uL (ref 200–950)

## 2017-12-10 LAB — COMPLETE METABOLIC PANEL WITH GFR
AG RATIO: 1.3 (calc) (ref 1.0–2.5)
ALT: 21 U/L (ref 9–46)
AST: 19 U/L (ref 10–40)
Albumin: 4.2 g/dL (ref 3.6–5.1)
Alkaline phosphatase (APISO): 106 U/L (ref 40–115)
BILIRUBIN TOTAL: 0.5 mg/dL (ref 0.2–1.2)
BUN/Creatinine Ratio: 7 (calc) (ref 6–22)
BUN: 6 mg/dL — ABNORMAL LOW (ref 7–25)
CHLORIDE: 103 mmol/L (ref 98–110)
CO2: 25 mmol/L (ref 20–32)
Calcium: 9.6 mg/dL (ref 8.6–10.3)
Creat: 0.88 mg/dL (ref 0.60–1.35)
GFR, Est African American: 117 mL/min/{1.73_m2} (ref >=60)
GFR, Est Non African American: 101 mL/min/{1.73_m2} (ref >=60)
Globulin: 3.3 g/dL (calc) (ref 1.9–3.7)
Glucose, Bld: 252 mg/dL — ABNORMAL HIGH (ref 65–99)
POTASSIUM: 4 mmol/L (ref 3.5–5.3)
Sodium: 136 mmol/L (ref 135–146)
Total Protein: 7.5 g/dL (ref 6.1–8.1)

## 2017-12-10 LAB — MICROALBUMIN, URINE: Microalb, Ur: 1.4 mg/dL

## 2017-12-10 LAB — HEMOGLOBIN A1C
HEMOGLOBIN A1C: 8.2 %{Hb} — AB (ref <5.7)
MEAN PLASMA GLUCOSE: 189 (calc)
eAG (mmol/L): 10.4 (calc)

## 2017-12-10 LAB — LIPID PANEL
Cholesterol: 157 mg/dL (ref <200)
HDL: 39 mg/dL — AB (ref >40)
LDL Cholesterol (Calc): 95 mg/dL (calc)
Non-HDL Cholesterol (Calc): 118 mg/dL (calc) (ref <130)
Total CHOL/HDL Ratio: 4 (calc) (ref <5.0)
Triglycerides: 131 mg/dL (ref <150)

## 2017-12-11 ENCOUNTER — Other Ambulatory Visit: Payer: Self-pay | Admitting: Family Medicine

## 2017-12-29 ENCOUNTER — Emergency Department (HOSPITAL_COMMUNITY): Payer: Medicare Other

## 2017-12-29 ENCOUNTER — Observation Stay (HOSPITAL_COMMUNITY)
Admission: EM | Admit: 2017-12-29 | Discharge: 2018-01-01 | Disposition: A | Discharge status: Home / Self Care | Payer: Medicare Other | Attending: Family Medicine | Admitting: Family Medicine

## 2017-12-29 DIAGNOSIS — K219 Gastro-esophageal reflux disease without esophagitis: Secondary | ICD-10-CM | POA: Diagnosis not present

## 2017-12-29 DIAGNOSIS — E1169 Type 2 diabetes mellitus with other specified complication: Secondary | ICD-10-CM | POA: Diagnosis present

## 2017-12-29 DIAGNOSIS — I10 Essential (primary) hypertension: Secondary | ICD-10-CM | POA: Diagnosis present

## 2017-12-29 DIAGNOSIS — R2681 Unsteadiness on feet: Secondary | ICD-10-CM | POA: Insufficient documentation

## 2017-12-29 DIAGNOSIS — E876 Hypokalemia: Secondary | ICD-10-CM | POA: Diagnosis not present

## 2017-12-29 DIAGNOSIS — Z794 Long term (current) use of insulin: Secondary | ICD-10-CM | POA: Diagnosis not present

## 2017-12-29 DIAGNOSIS — E785 Hyperlipidemia, unspecified: Secondary | ICD-10-CM | POA: Diagnosis present

## 2017-12-29 DIAGNOSIS — Z79899 Other long term (current) drug therapy: Secondary | ICD-10-CM | POA: Insufficient documentation

## 2017-12-29 DIAGNOSIS — Z9114 Patient's other noncompliance with medication regimen: Secondary | ICD-10-CM | POA: Diagnosis not present

## 2017-12-29 DIAGNOSIS — N179 Acute kidney failure, unspecified: Secondary | ICD-10-CM | POA: Diagnosis not present

## 2017-12-29 DIAGNOSIS — E1310 Other specified diabetes mellitus with ketoacidosis without coma: Secondary | ICD-10-CM | POA: Diagnosis present

## 2017-12-29 DIAGNOSIS — R079 Chest pain, unspecified: Secondary | ICD-10-CM | POA: Diagnosis not present

## 2017-12-29 DIAGNOSIS — Z85038 Personal history of other malignant neoplasm of large intestine: Secondary | ICD-10-CM | POA: Insufficient documentation

## 2017-12-29 DIAGNOSIS — Z859 Personal history of malignant neoplasm, unspecified: Secondary | ICD-10-CM

## 2017-12-29 DIAGNOSIS — E1110 Type 2 diabetes mellitus with ketoacidosis without coma: Secondary | ICD-10-CM | POA: Diagnosis not present

## 2017-12-29 DIAGNOSIS — R000 Tachycardia, unspecified: Secondary | ICD-10-CM | POA: Diagnosis not present

## 2017-12-29 LAB — BASIC METABOLIC PANEL
Anion gap: 28 — ABNORMAL HIGH (ref 5–15)
BUN: 23 mg/dL — ABNORMAL HIGH (ref 6–20)
CHLORIDE: 96 mmol/L — AB (ref 101–111)
CO2: 12 mmol/L — AB (ref 22–32)
Calcium: 9 mg/dL (ref 8.9–10.3)
Creatinine, Ser: 1.51 mg/dL — ABNORMAL HIGH (ref 0.61–1.24)
GFR calc non Af Amer: 53 mL/min — ABNORMAL LOW (ref >60)
Glucose, Bld: 223 mg/dL — ABNORMAL HIGH (ref 65–99)
Potassium: 3.8 mmol/L (ref 3.5–5.1)
SODIUM: 136 mmol/L (ref 135–145)

## 2017-12-29 LAB — CBC
HCT: 52.3 % — ABNORMAL HIGH (ref 39.0–52.0)
Hemoglobin: 18.5 g/dL — ABNORMAL HIGH (ref 13.0–17.0)
MCH: 33.9 pg (ref 26.0–34.0)
MCHC: 35.4 g/dL (ref 30.0–36.0)
MCV: 96 fL (ref 78.0–100.0)
Platelets: 206 10*3/uL (ref 150–400)
RBC: 5.45 MIL/uL (ref 4.22–5.81)
RDW: 12.6 % (ref 11.5–15.5)
WBC: 11.5 10*3/uL — AB (ref 4.0–10.5)

## 2017-12-29 LAB — LIPASE, BLOOD: Lipase: 31 U/L (ref 11–51)

## 2017-12-29 LAB — I-STAT TROPONIN, ED
Troponin i, poc: 0.01 ng/mL (ref 0.00–0.08)
Troponin i, poc: 0 ng/mL (ref 0.00–0.08)

## 2017-12-29 NOTE — ED Triage Notes (Signed)
Pt presents to ED with complaints of nausea, vomiting, central chest pain that is reproducible. Pain denies radiation of pain, sob, diaphoresis. #22 RH  4mg  Zofran IV given PTA  134/60 Hr 118 16 rr spo2 97% RA cbg 242

## 2017-12-30 ENCOUNTER — Encounter (HOSPITAL_COMMUNITY): Payer: Self-pay | Admitting: Physician Assistant

## 2017-12-30 DIAGNOSIS — E1110 Type 2 diabetes mellitus with ketoacidosis without coma: Secondary | ICD-10-CM | POA: Diagnosis not present

## 2017-12-30 DIAGNOSIS — Z794 Long term (current) use of insulin: Secondary | ICD-10-CM

## 2017-12-30 LAB — GLUCOSE, CAPILLARY
GLUCOSE-CAPILLARY: 178 mg/dL — AB (ref 65–99)
GLUCOSE-CAPILLARY: 157 mg/dL — AB (ref 65–99)
GLUCOSE-CAPILLARY: 171 mg/dL — AB (ref 65–99)
Glucose-Capillary: 153 mg/dL — ABNORMAL HIGH (ref 65–99)
Glucose-Capillary: 165 mg/dL — ABNORMAL HIGH (ref 65–99)
Glucose-Capillary: 171 mg/dL — ABNORMAL HIGH (ref 65–99)
Glucose-Capillary: 173 mg/dL — ABNORMAL HIGH (ref 65–99)
Glucose-Capillary: 192 mg/dL — ABNORMAL HIGH (ref 65–99)

## 2017-12-30 LAB — CBG MONITORING, ED
GLUCOSE-CAPILLARY: 257 mg/dL — AB (ref 65–99)
GLUCOSE-CAPILLARY: 180 mg/dL — AB (ref 65–99)
GLUCOSE-CAPILLARY: 167 mg/dL — AB (ref 65–99)
GLUCOSE-CAPILLARY: 160 mg/dL — AB (ref 65–99)
GLUCOSE-CAPILLARY: 134 mg/dL — AB (ref 65–99)
Glucose-Capillary: 299 mg/dL — ABNORMAL HIGH (ref 65–99)
Glucose-Capillary: 183 mg/dL — ABNORMAL HIGH (ref 65–99)
Glucose-Capillary: 196 mg/dL — ABNORMAL HIGH (ref 65–99)
Glucose-Capillary: 161 mg/dL — ABNORMAL HIGH (ref 65–99)
Glucose-Capillary: 149 mg/dL — ABNORMAL HIGH (ref 65–99)
Glucose-Capillary: 156 mg/dL — ABNORMAL HIGH (ref 65–99)

## 2017-12-30 LAB — BASIC METABOLIC PANEL WITH GFR
Anion gap: 23 — ABNORMAL HIGH (ref 5–15)
Anion gap: 17 — ABNORMAL HIGH (ref 5–15)
BUN: 31 mg/dL — ABNORMAL HIGH (ref 6–20)
BUN: 27 mg/dL — ABNORMAL HIGH (ref 6–20)
CO2: 13 mmol/L — ABNORMAL LOW (ref 22–32)
CO2: 18 mmol/L — ABNORMAL LOW (ref 22–32)
Calcium: 8.4 mg/dL — ABNORMAL LOW (ref 8.9–10.3)
Calcium: 8.5 mg/dL — ABNORMAL LOW (ref 8.9–10.3)
Chloride: 102 mmol/L (ref 101–111)
Chloride: 103 mmol/L (ref 101–111)
Creatinine, Ser: 1.52 mg/dL — ABNORMAL HIGH (ref 0.61–1.24)
Creatinine, Ser: 1.32 mg/dL — ABNORMAL HIGH (ref 0.61–1.24)
GFR calc Af Amer: >60 mL/min
GFR calc Af Amer: >60 mL/min
GFR calc non Af Amer: 52 mL/min — ABNORMAL LOW
GFR calc non Af Amer: >60 mL/min
Glucose, Bld: 204 mg/dL — ABNORMAL HIGH (ref 65–99)
Glucose, Bld: 174 mg/dL — ABNORMAL HIGH (ref 65–99)
Potassium: 3.9 mmol/L (ref 3.5–5.1)
Potassium: 3.5 mmol/L (ref 3.5–5.1)
Sodium: 138 mmol/L (ref 135–145)
Sodium: 138 mmol/L (ref 135–145)

## 2017-12-30 LAB — URINALYSIS, ROUTINE W REFLEX MICROSCOPIC
Bacteria, UA: NONE SEEN
Bilirubin Urine: NEGATIVE
Glucose, UA: >=500 mg/dL — AB
Hgb urine dipstick: NEGATIVE
Ketones, ur: 80 mg/dL — AB
Leukocytes, UA: NEGATIVE
Nitrite: NEGATIVE
Protein, ur: NEGATIVE mg/dL
Specific Gravity, Urine: 1.022 (ref 1.005–1.030)
Squamous Epithelial / HPF: NONE SEEN (ref 0–5)
pH: 5 (ref 5.0–8.0)

## 2017-12-30 LAB — BASIC METABOLIC PANEL
ANION GAP: 17 — AB (ref 5–15)
Anion gap: 18 — ABNORMAL HIGH (ref 5–15)
Anion gap: 16 — ABNORMAL HIGH (ref 5–15)
BUN: 22 mg/dL — ABNORMAL HIGH (ref 6–20)
BUN: 19 mg/dL (ref 6–20)
BUN: 17 mg/dL (ref 6–20)
CALCIUM: 8.7 mg/dL — AB (ref 8.9–10.3)
CHLORIDE: 101 mmol/L (ref 101–111)
CHLORIDE: 99 mmol/L — AB (ref 101–111)
CO2: 17 mmol/L — ABNORMAL LOW (ref 22–32)
CO2: 16 mmol/L — ABNORMAL LOW (ref 22–32)
CO2: 17 mmol/L — ABNORMAL LOW (ref 22–32)
CREATININE: 1.1 mg/dL (ref 0.61–1.24)
CREATININE: 1.14 mg/dL (ref 0.61–1.24)
Calcium: 8.5 mg/dL — ABNORMAL LOW (ref 8.9–10.3)
Calcium: 8.4 mg/dL — ABNORMAL LOW (ref 8.9–10.3)
Chloride: 105 mmol/L (ref 101–111)
Creatinine, Ser: 1.1 mg/dL (ref 0.61–1.24)
GFR calc Af Amer: >60 mL/min (ref >60)
GFR calc non Af Amer: >60 mL/min (ref >60)
GFR calc non Af Amer: >60 mL/min (ref >60)
GLUCOSE: 148 mg/dL — AB (ref 65–99)
Glucose, Bld: 147 mg/dL — ABNORMAL HIGH (ref 65–99)
Glucose, Bld: 165 mg/dL — ABNORMAL HIGH (ref 65–99)
POTASSIUM: 4.1 mmol/L (ref 3.5–5.1)
POTASSIUM: 3.7 mmol/L (ref 3.5–5.1)
Potassium: 4.6 mmol/L (ref 3.5–5.1)
SODIUM: 135 mmol/L (ref 135–145)
SODIUM: 132 mmol/L — AB (ref 135–145)
Sodium: 139 mmol/L (ref 135–145)

## 2017-12-30 LAB — TROPONIN I
Troponin I: <0.03 ng/mL
Troponin I: <0.03 ng/mL (ref <0.03)

## 2017-12-30 LAB — CBC WITH DIFFERENTIAL/PLATELET
Basophils Absolute: 0 10*3/uL (ref 0.0–0.1)
Basophils Relative: 0 %
EOS ABS: 0 10*3/uL (ref 0.0–0.7)
EOS PCT: 0 %
HCT: 49.8 % (ref 39.0–52.0)
Hemoglobin: 17.6 g/dL — ABNORMAL HIGH (ref 13.0–17.0)
LYMPHS ABS: 1.1 10*3/uL (ref 0.7–4.0)
Lymphocytes Relative: 9 %
MCH: 33.6 pg (ref 26.0–34.0)
MCHC: 35.3 g/dL (ref 30.0–36.0)
MCV: 95 fL (ref 78.0–100.0)
Monocytes Absolute: 1.4 10*3/uL — ABNORMAL HIGH (ref 0.1–1.0)
Monocytes Relative: 11 %
Neutro Abs: 9.7 10*3/uL — ABNORMAL HIGH (ref 1.7–7.7)
Neutrophils Relative %: 80 %
PLATELETS: 186 10*3/uL (ref 150–400)
RBC: 5.24 MIL/uL (ref 4.22–5.81)
RDW: 12.6 % (ref 11.5–15.5)
WBC: 12.2 10*3/uL — AB (ref 4.0–10.5)

## 2017-12-30 LAB — TYPE AND SCREEN
ABO/RH(D): A POS
ANTIBODY SCREEN: NEGATIVE

## 2017-12-30 LAB — LACTIC ACID, PLASMA: LACTIC ACID, VENOUS: 1.6 mmol/L (ref 0.5–1.9)

## 2017-12-30 LAB — I-STAT CG4 LACTIC ACID, ED: Lactic Acid, Venous: 4.79 mmol/L (ref 0.5–1.9)

## 2017-12-30 LAB — BRAIN NATRIURETIC PEPTIDE: B Natriuretic Peptide: 65.3 pg/mL (ref 0.0–100.0)

## 2017-12-30 MED ORDER — ACETAMINOPHEN 325 MG PO TABS: 650.0000 mg | ORAL_TABLET | Freq: Four times a day (QID) | ORAL | Status: DC | PRN

## 2017-12-30 MED ORDER — SODIUM CHLORIDE 0.9 % IV BOLUS
1000.0000 mL | Freq: Once | INTRAVENOUS | Status: AC
  Administered 2017-12-31: 1000 mL via INTRAVENOUS

## 2017-12-30 MED ORDER — SODIUM CHLORIDE 0.9 % IV SOLN: INTRAVENOUS | Status: DC

## 2017-12-30 MED ORDER — ONDANSETRON HCL 4 MG/2ML IJ SOLN: 4.0000 mg | Freq: Four times a day (QID) | INTRAMUSCULAR | Status: DC | PRN

## 2017-12-30 MED ORDER — SODIUM CHLORIDE 0.9 % IV SOLN
INTRAVENOUS | Status: DC
  Filled 2017-12-30: qty 1

## 2017-12-30 MED ORDER — ONDANSETRON HCL 4 MG PO TABS
4.0000 mg | ORAL_TABLET | Freq: Four times a day (QID) | ORAL | Status: DC | PRN
Start: 2017-12-30 — End: 2018-01-01

## 2017-12-30 MED ORDER — SODIUM CHLORIDE 0.9 % IV SOLN
INTRAVENOUS | Status: DC
Start: 2017-12-30 — End: 2017-12-31

## 2017-12-30 MED ORDER — POTASSIUM CHLORIDE CRYS ER 20 MEQ PO TBCR
30.0000 meq | EXTENDED_RELEASE_TABLET | Freq: Once | ORAL | Status: AC
  Administered 2017-12-30: 30 meq via ORAL
  Filled 2017-12-30: qty 1

## 2017-12-30 MED ORDER — SODIUM CHLORIDE 0.9 % IV BOLUS
1000.0000 mL | Freq: Once | INTRAVENOUS | Status: AC
  Administered 2017-12-30: 1000 mL via INTRAVENOUS

## 2017-12-30 MED ORDER — DEXTROSE-NACL 5-0.45 % IV SOLN
INTRAVENOUS | Status: DC
  Administered 2017-12-30 (×3): via INTRAVENOUS

## 2017-12-30 MED ORDER — SODIUM CHLORIDE 0.9 % IV SOLN
INTRAVENOUS | Status: DC
  Administered 2017-12-30: 2 [IU]/h via INTRAVENOUS
  Filled 2017-12-30: qty 1

## 2017-12-30 MED ORDER — SENNOSIDES-DOCUSATE SODIUM 8.6-50 MG PO TABS: 1.0000 | ORAL_TABLET | Freq: Every evening | ORAL | Status: DC | PRN

## 2017-12-30 MED ORDER — BISACODYL 10 MG RE SUPP: 10.0000 mg | Freq: Every day | RECTAL | Status: DC | PRN

## 2017-12-30 MED ORDER — DEXTROSE-NACL 5-0.45 % IV SOLN
INTRAVENOUS | Status: DC
  Administered 2017-12-30: 06:00:00 via INTRAVENOUS

## 2017-12-30 MED ORDER — GABAPENTIN 100 MG PO CAPS
100.0000 mg | ORAL_CAPSULE | Freq: Three times a day (TID) | ORAL | Status: DC
  Administered 2017-12-30 – 2018-01-01 (×7): 100 mg via ORAL
  Filled 2017-12-30 (×7): qty 1

## 2017-12-30 MED ORDER — POTASSIUM CHLORIDE 10 MEQ/100ML IV SOLN: 10.0000 meq | INTRAVENOUS | Status: DC

## 2017-12-30 MED ORDER — FOLIC ACID 1 MG PO TABS
1.0000 mg | ORAL_TABLET | Freq: Every day | ORAL | Status: DC
  Administered 2017-12-30 – 2018-01-01 (×3): 1 mg via ORAL
  Filled 2017-12-30 (×3): qty 1

## 2017-12-30 MED ORDER — POTASSIUM CHLORIDE 10 MEQ/100ML IV SOLN
10.0000 meq | INTRAVENOUS | Status: AC
  Administered 2017-12-30: 10 meq via INTRAVENOUS
  Filled 2017-12-30: qty 100

## 2017-12-30 MED ORDER — HYDROCODONE-ACETAMINOPHEN 5-325 MG PO TABS
1.0000 | ORAL_TABLET | ORAL | Status: DC | PRN
  Administered 2018-01-01: 2 via ORAL
  Filled 2017-12-30: qty 2

## 2017-12-30 MED ORDER — ROSUVASTATIN CALCIUM 20 MG PO TABS
20.0000 mg | ORAL_TABLET | Freq: Every day | ORAL | Status: DC
  Administered 2017-12-30 – 2018-01-01 (×3): 20 mg via ORAL
  Filled 2017-12-30 (×3): qty 1

## 2017-12-30 MED ORDER — ACETAMINOPHEN 650 MG RE SUPP
650.0000 mg | Freq: Four times a day (QID) | RECTAL | Status: DC | PRN
Start: 2017-12-30 — End: 2018-01-01

## 2017-12-30 MED ORDER — FAMOTIDINE IN NACL 20-0.9 MG/50ML-% IV SOLN
20.0000 mg | Freq: Two times a day (BID) | INTRAVENOUS | Status: DC
  Administered 2017-12-30 – 2018-01-01 (×6): 20 mg via INTRAVENOUS
  Filled 2017-12-30 (×6): qty 50

## 2017-12-30 MED ORDER — ONDANSETRON HCL 4 MG/2ML IJ SOLN
4.0000 mg | Freq: Once | INTRAMUSCULAR | Status: AC
  Administered 2017-12-30: 4 mg via INTRAVENOUS
  Filled 2017-12-30: qty 2

## 2017-12-30 MED ORDER — POTASSIUM CHLORIDE 10 MEQ/100ML IV SOLN
10.0000 meq | Freq: Once | INTRAVENOUS | Status: AC
  Administered 2017-12-30: 10 meq via INTRAVENOUS
  Filled 2017-12-30: qty 100

## 2017-12-30 MED ORDER — MORPHINE SULFATE (PF) 4 MG/ML IV SOLN
2.0000 mg | INTRAVENOUS | Status: DC | PRN
Start: 2017-12-30 — End: 2018-01-01

## 2017-12-30 NOTE — ED Notes (Signed)
Attempted bmet draw x 2 unsuccessfully. Phlebotomy aware

## 2017-12-30 NOTE — Progress Notes (Signed)
[name] [id number] Admission Data: 12/30/2017 7:38 PM Attending Provider: Karmen Bongo, MD  HQP:[id number], Cammie Mcgee, MD Consults/ Treatment Team:   [name] is a 52 y.o. male patient admitted from ED awake, alert  & orientated  X 3,  Full Code, VSS - Blood pressure (!) 138/101, pulse (!) 116, temperature 97.8 F (36.6 C), resp. rate (!) 23, SpO2 92 %., no c/o shortness of breath, no c/o chest pain, no distress noted. Tele # [id number] placed and pt is currently running:sinus tachycardia.   IV site WDL:  hand right, condition patent and no redness and left, condition patent and no redness and antecubital right, condition patent and no redness with a transparent dsg that's clean dry and intact.  Allergies:  No Known Allergies   Past Medical History:  Diagnosis Date  . Cancer [facility]) carcinoid  . Colon cancer ([place])   . GERD (gastroesophageal reflux disease)   . Hyperlipemia   . Hypertension   . Kidney stones   . Shortness of breath    with exertion  . Type II diabetes mellitus ([place])   . UTI (lower urinary tract infection)   . Ventral hernia     History:  obtained from chart review. Tobacco/alcohol: unknown tobacco use  Pt orientation to unit, room and routine. Information packet given to patient/family and safety video watched.  Admission INP armband ID verified with patient/family, and in place. SR up x 2, fall risk assessment complete with Patient and family verbalizing understanding of risks associated with falls. Pt verbalizes an understanding of how to use the call bell and to call for help before getting out of bed.  Skin, clean-dry- intact without evidence of bruising, or skin tears.   No evidence of skin break down noted on exam. no rashes, no ecchymoses    Will cont to monitor and assist as needed.  Eliot Ford, RN 12/30/2017 7:38 PM

## 2017-12-30 NOTE — Plan of Care (Signed)
Received signout from Dr. Rolland Porter. [name] is a 52 year old male with pmh of DM type II; who presents with complaints of chest pain and emesis.  Emesis was noted to be dark brown and was sent for gastric occult.  Lab work performed at 1400 on 4/22 revealed WBC 11.5, hemoglobin 18.5, CO2 12, BUN 23, creatinine 1.51, AG 28, lactic acid 4.79,and troponin negative times.  No urinalysis or venous pH was obtained.  Patient was given Pepcid IV, started on insulin drip, and given 3 L normal saline IV fluids for suspected DKA.  TRH called to admit.  Ordered repeat CBC, BMP every 4 hours, will need to follow-up gastric occult, and urinalysis.  Admitted as inpatient to a stepdown bed.

## 2017-12-30 NOTE — Progress Notes (Signed)
BNP sample was grossly hemolyzed as per LAb report. Stat BNP reordered

## 2017-12-30 NOTE — H&P (Signed)
History and Physical    [name] NTI:[id number] DOB: 12-Oct-1965 DOA: 12/29/2017   PCP: Susy Frizzle, MD   Patient coming from:  Home    Chief Complaint: Chest pain and emesis   HPI: [name] is a 52 y.o. male with medical history significant for colon cancer, status post colectomy in 2009, history of cholecystectomy, history of nephrolithiasis, hypertension, hyperlipidemia, diabetes, recent admission for enterocolitis on 11/04/2017, presenting to the emergency department last night, with complaints of chest discomfort, and brown emesis.  In review, as of then 56, the patient felt that after eating steak, mashed potatoes, rice and beans, began vomiting at night, then without any complaints until he started vomiting again around the 21st.  Patient, the patient continued to vomit, while waiting in the holding area.  She also reported having some pain in the center of his chest, when she was vomiting.  In addition, she reported lower abdominal pain, feeling "that she was beat with a ball bat ".  On presentation, the patient had nothing to drink all day.  Fever or diarrhea.  He has some shortness of breath, exertional.  He also reported dark stools, but no frank blood.  He denies any prior history of stomach ulcers.  He denies taking Alka-Seltzer, aspirin, ibuprofen, or any other anti-inflammatory.  His last alcohol intake was 2 weeks ago.  He also reports that he ran out of his insulin 1 week ago, he has not taken any other issues for his diabetes.  ED Course:  BP (!) 133/97   Pulse (!) 125   Temp 98.2 F (36.8 C) (Oral)   Resp 18   SpO2 98%   Hemoccult Gastroccult is pending White count 11.5, hemoglobin 18.5, bicarb 12, BUN 23, creatinine 1.51, anion gap 28, lactic acid 4.79, troponin negative. Urinalysis pending. Patient received Pepcid IV for possible gastritis, started on insulin drip, given 3 L of normal saline IV fluids, for suspected DKA. He also received protocol potassium  chloride. Symptoms are now improved after initiation of all his meds.    Review of Systems:  As per HPI otherwise all other systems reviewed and are negative  Past Medical History:  Diagnosis Date  . Cancer [facility]) carcinoid  . Colon cancer ([place])   . GERD (gastroesophageal reflux disease)   . Hyperlipemia   . Hypertension   . Kidney stones   . Shortness of breath    with exertion  . Type II diabetes mellitus ([place])   . UTI (lower urinary tract infection)   . Ventral hernia     Past Surgical History:  Procedure Laterality Date  . ABDOMINAL HERNIA REPAIR  01/20/2014   recurrent VHR w/mesh  . CHOLECYSTECTOMY    . COLECTOMY  07/2008   "got all the cancer out"  . HERNIA REPAIR  02/2009; 01/20/2014   IHR; VHR  . INSERTION OF MESH N/A 01/20/2014   Procedure: INSERTION OF MESH;  Surgeon: [name]. [name], MD;  Location: [place];  Service: General;  Laterality: N/A;  . [place]   "had to open me up after they busted; cut just above pubis"  . VENTRAL HERNIA REPAIR N/A 01/20/2014   Procedure: OPEN REPAIR OF A RECURRENT VENTRAL HERNIA REPAIR;  Surgeon: [name]. [name], MD;  Location: [place] [facility];  Service: General;  Laterality: N/A;    Social History Social History   Socioeconomic History  . Marital status: Single    Spouse name: Not on file  .  Number of children: Not on file  . Years of education: Not on file  . Highest education level: Not on file  Occupational History  . Not on file  Social Needs  . Financial resource strain: Not on file  . Food insecurity:    Worry: Not on file    Inability: Not on file  . Transportation needs:    Medical: Not on file    Non-medical: Not on file  Tobacco Use  . Smoking status: Never Smoker  . Smokeless tobacco: Never Used  Substance and Sexual Activity  . Alcohol use: No  . Drug use: No  . Sexual activity: Not Currently  Lifestyle  . Physical activity:    Days per week: Not on file    Minutes per session: Not on file  .  Stress: Not on file  Relationships  . Social connections:    Talks on phone: Not on file    Gets together: Not on file    Attends religious service: Not on file    Active member of club or organization: Not on file    Attends meetings of clubs or organizations: Not on file    Relationship status: Not on file  . Intimate partner violence:    Fear of current or ex partner: Not on file    Emotionally abused: Not on file    Physically abused: Not on file    Forced sexual activity: Not on file  Other Topics Concern  . Not on file  Social History Narrative  . Not on file     No Known Allergies  Family History  Problem Relation Age of Onset  . Cancer Mother 50       breast cancer  . Breast cancer Mother   . Diabetes Mother   . Emphysema Father   . Diabetes Sister       Prior to Admission medications   Medication Sig Start Date End Date Taking? Authorizing Provider  empagliflozin (JARDIANCE) 25 MG TABS tablet Take 25 mg by mouth daily. 12/09/17  Yes Susy Frizzle, MD  folic acid (FOLVITE) 1 MG tablet Take 1 tablet (1 mg total) by mouth daily. 07/09/17  Yes Susy Frizzle, MD  gabapentin (NEURONTIN) 100 MG capsule Take 1 capsule (100 mg total) by mouth 3 (three) times daily. 06/28/17  Yes Mu, Pilar Plate, MD  Insulin Glargine (LANTUS SOLOSTAR) 100 UNIT/ML Solostar Pen Inject 30 Units into the skin daily. 12/09/17  Yes Susy Frizzle, MD  metoCLOPramide (REGLAN) 10 MG tablet TAKE 1 TABLET BY MOUTH FOUR TIMES DAILY. Patient taking differently: TAKE 1 TABLET (10mg ) BY MOUTH FOUR TIMES DAILY. 07/17/17  Yes Susy Frizzle, MD  pioglitazone (ACTOS) 30 MG tablet TAKE 1 TABLET BY MOUTH DAILY 12/11/17  Yes Susy Frizzle, MD  rosuvastatin (CRESTOR) 20 MG tablet TAKE 1 TABLET BY MOUTH DAILY Patient taking differently: TAKE 1 TABLET (20mg ) BY MOUTH DAILY 07/18/17  Yes Susy Frizzle, MD  Insulin Pen Needle (PEN NEEDLES) 32G X 4 MM MISC 1 Stick by Does not apply route daily. 12/09/17    Susy Frizzle, MD  magnesium chloride (SLOW-MAG) 64 MG TBEC SR tablet Take 1 tablet (64 mg total) by mouth 2 (two) times daily. Patient not taking: Reported on 12/09/2017 01/15/17   Susy Frizzle, MD  potassium chloride SA (K-DUR,KLOR-CON) 20 MEQ tablet Take 1 tablet (20 mEq total) by mouth daily. Patient not taking: Reported on 12/09/2017 05/08/17   Susy Frizzle, MD  Physical Exam:  Vitals:   12/30/17 0615 12/30/17 0630 12/30/17 0645 12/30/17 0700  BP: (!) 147/92 (!) 144/91 (!) 140/96 (!) 133/97  Pulse: (!) 109 (!) 114 (!) 116 (!) 125  Resp:      Temp:      TempSrc:      SpO2: 94% 96% 96% 98%   Constitutional: NAD, calm, ill appearing, but more comfortable than on presentation.  Disheveled appearance . Eyes: PERRL, lids and conjunctivae normal ENMT: Mucous membranes are dry, without exudate or lesions  Neck: normal, supple, no masses, no thyromegaly Respiratory: clear to auscultation bilaterally, no wheezing, no crackles. Normal respiratory effort  Cardiovascular: Regular rate and rhythm, no murmur, rubs or gallops. No extremity edema. 2+ pedal pulses. No carotid bruits.  Abdomen: Soft, non tender, mildly obese  No hepatosplenomegaly. Bowel sounds positive.  Musculoskeletal: no clubbing / cyanosis. Moves all extremities Skin: no jaundice, No lesions.  Neurologic: Sensation intact  Strength equal in all extremities Psychiatric:   Alert and oriented x 3.  Mildly anxious mood.     Labs on Admission: I have personally reviewed following labs and imaging studies  CBC: Recent Labs  Lab 12/29/17 1409 12/30/17 0410  WBC 11.5* 12.2*  NEUTROABS  --  9.7*  HGB 18.5* 17.6*  HCT 52.3* 49.8  MCV 96.0 95.0  PLT 206 562    Basic Metabolic Panel: Recent Labs  Lab 12/29/17 1409 12/30/17 0410  NA 136 138  K 3.8 3.9  CL 96* 102  CO2 12* 13*  GLUCOSE 223* 204*  BUN 23* 31*  CREATININE 1.51* 1.52*  CALCIUM 9.0 8.4*    GFR: CrCl cannot be calculated (Unknown ideal  weight.).  Liver Function Tests: No results for input(s): AST, ALT, ALKPHOS, BILITOT, PROT, ALBUMIN in the last 168 hours. Recent Labs  Lab 12/29/17 1409  LIPASE 31   No results for input(s): AMMONIA in the last 168 hours.  Coagulation Profile: No results for input(s): INR, PROTIME in the last 168 hours.  Cardiac Enzymes: Recent Labs  Lab 12/30/17 0410  TROPONINI <0.03    BNP (last 3 results) No results for input(s): PROBNP in the last 8760 hours.  HbA1C: No results for input(s): HGBA1C in the last 72 hours.  CBG: Recent Labs  Lab 12/30/17 0146 12/30/17 0430 12/30/17 0544 12/30/17 0648  GLUCAP 299* 257* 183* 196*    Lipid Profile: No results for input(s): CHOL, HDL, LDLCALC, TRIG, CHOLHDL, LDLDIRECT in the last 72 hours.  Thyroid Function Tests: No results for input(s): TSH, T4TOTAL, FREET4, T3FREE, THYROIDAB in the last 72 hours.  Anemia Panel: No results for input(s): VITAMINB12, FOLATE, FERRITIN, TIBC, IRON, RETICCTPCT in the last 72 hours.  Urine analysis:    Component Value Date/Time   COLORURINE YELLOW 12/30/2017 [place] 12/30/2017 0442   LABSPEC 1.022 12/30/2017 0442   PHURINE 5.0 12/30/2017 0442   GLUCOSEU >=500 (A) 12/30/2017 0442   HGBUR NEGATIVE 12/30/2017 0442   HGBUR negative 09/07/2009 0822   BILIRUBINUR NEGATIVE 12/30/2017 0442   KETONESUR 80 (A) 12/30/2017 0442   PROTEINUR NEGATIVE 12/30/2017 0442   UROBILINOGEN 0.2 09/07/2009 0822   NITRITE NEGATIVE 12/30/2017 0442   LEUKOCYTESUR NEGATIVE 12/30/2017 0442    Sepsis Labs: @LABRCNTIP (procalcitonin:4,lacticidven:4) )No results found for this or any previous visit (from the past 240 hour(s)).   Radiological Exams on Admission: Dg Chest 2 View  Result Date: 12/29/2017 CLINICAL DATA:  Nausea, vomiting and central chest pain that is reproducible. EXAM: CHEST - 2 VIEW  COMPARISON:  01/02/2017 FINDINGS: The heart size and mediastinal contours are within normal limits. Both  lungs are clear. Stable mild degenerative change along the dorsal spine. IMPRESSION: No active cardiopulmonary disease. Electronically Signed   By: Ashley Royalty [name].   On: 12/29/2017 21:04    EKG: Independently reviewed.  Assessment/Plan Principal Problem:   DKA, type 2 (HCC) Active Problems:   Type 2 diabetes mellitus with hyperlipidemia (HCC)   Hyperlipidemia   Essential hypertension   GERD   History of malignant carcinoid tumor     Symptomatic Diabetic Ketoacidosis Admission Glucose was 288 , Anion Gap28 , Lactic Acid 4.71, now 1.6  , Bicarb 12 , K 3.9  All other workup pending. Received 3  l IV NS,  Insulin drip initiated  At the ED  Admit IP  SDU DKA order set  Hold oral hypoglycemics and Lantus  UA with cultures Serial lactic acid and BMET K replenishment per protocol  NPO for now  Dark stools and Emesis rule out Gastro Intestinal Bleed Etiology is unknown at this time ?gastritis. Patient with history of carcinoid tumor, s/p colectomy 2009. Last colonoscopy in 2013, normal. Last EGD 2013 with mild gastritis     IV Pepcid and antiemetics initiated in the ED  Baseline Hgb is 17.6, MCV 95, BUN  Is 31  . Emesis and nausea has resolved after antiemetics and Pepcid  Will type and screen, Serial H/H  Continue Pepcid bid  IVF at 125 cc/h NPO for now  Hold NSAIDs Check H pylori  Follow Hemoccult and Gastroccult  May need GI evaluation pending on results   GERD,   Continue PPI  Acute Kidney Injury likely due to dehydration  No confusion.  BL Cr  0.8, current 1.52 Lab Results  Component Value Date   CREATININE 1.52 (H) 12/30/2017   CREATININE 1.51 (H) 12/29/2017   CREATININE 0.88 12/09/2017  IVF Follow BMET in am  Hold Ace inhibitors     Hypertension BP  158/98   Pulse   119   Controlled Hold any  anti-hypertensive medications due to possible GIB     Hyperlipidemia Continue home statins   Peripheral neuropathy Continue Neurontin     DVT prophylaxis:  SCD    Code Status:    Full  Family Communication:  Discussed with patient Disposition Plan: Expect patient to be discharged to home after condition improves Consults called:    None  Admission status:  IP Stepdown    Sharene Butters, PA-C [facility]   Amion text  [phone]   12/30/2017, 7:17 AM

## 2017-12-30 NOTE — Progress Notes (Signed)
Inpatient Diabetes Program   AACE/ADA: New Consensus Statement on Inpatient Glycemic Control (2015)  Target Ranges:  Prepandial:   less than 140 mg/dL      Peak postprandial:   less than 180 mg/dL (1-2 hours)      Critically ill patients:  140 - 180 mg/dL   Spoke with patient in regards to not getting insulin for about a month. Patient reports transportation is a problem and that his mom usually picks up his medication, however, she just had eye surgery and can't pick up the prescriptions. Patient reports he lives in [place] and his Pharmacy is in [place]. Patient reports having a scooter he can drive 2 hours to pharmacy and 2 hours back to pick up medicine , however his son in law has his scooter for work. Asked patient about switching pharmacy to a closer pharmacy in [place], patient reports he does not know [place].   I spoke with patient to call number on back on insurance card and ask about mail order scripts. This would be the best way for patient to receive medications when he has unreliable transportation.  Patient reports that he can afford medications his only issue is transportation.  Thanks,  Tama Headings RN, MSN, BC-ADM, [facility] Inpatient Diabetes Coordinator Team Pager [phone] (8a-5p)

## 2017-12-30 NOTE — ED Provider Notes (Signed)
[facility] EMERGENCY DEPARTMENT Provider Note   CSN: [id number] Arrival date & time: 12/29/17  1407  Time seen 01:18 AM   History   Chief Complaint Chief Complaint  Patient presents with  . Emesis  . Chest Pain    HPI [name] is a 52 y.o. male.  HPI patient initially was telling me some other things however the most important thing is he ran out of his insulin a week ago.  He states the pharmacy is still trying to get approval for it.  Patient states he went to a cookout on the 19th and ate steak, mashed potatoes, rice, and beans, and he states he does not normally eat beans and he started vomiting that night.  He states the next day he felt fine however on the 21st he started vomiting again.  He states he continues to have vomiting in the ED and his vomitus started turning dark while waiting in the waiting room to be seen.  He states he has pain in the center of his chest when he vomits that is been there constantly.  He has some lower abdominal pain that "feels like he was beat with a ball bat".  He states he has had nothing to drink all day.  He states he took some Zofran at home without relief.  He denies fever or diarrhea.  He states he does feel short of breath.  He states that his bowel movements have not been black however when he vomits it has a burning to it.  He denies any prior history of having stomach ulcers.  He states he did have colon cancer.  He denies taking Alka-Seltzer's, aspirin, ibuprofen or any other anti-inflammatory.  He states his last alcohol was 2 weekends ago.  PCP Susy Frizzle, MD  Past Medical History:  Diagnosis Date  . Cancer [facility]) carcinoid  . Colon cancer ([place])   . GERD (gastroesophageal reflux disease)   . Hyperlipemia   . Hypertension   . Kidney stones   . Shortness of breath    with exertion  . Type II diabetes mellitus ([place])   . UTI (lower urinary tract infection)   . Ventral hernia     Patient Active Problem  List   Diagnosis Date Noted  . DKA, type 2 ([place]) 12/30/2017  . Weakness 01/02/2017  . Hypokalemia 01/02/2017  . Hypomagnesemia 01/02/2017  . Seroma complicating a procedure 02/21/2014  . Recurrent ventral incisional hernia 01/20/2014  . Gastroparesis 05/27/2012  . Nausea and vomiting 03/25/2012  . History of malignant carcinoid tumor 03/25/2012  . FINGER SPRAIN 03/19/2010  . GERD 09/07/2009  . ANKLE INJURY, LEFT 05/31/2009  . ABDOMINAL PAIN 05/19/2009  . HEMATURIA, HX OF 05/19/2009  . VENTRAL HERNIA, INCISIONAL 12/23/2008  . URINARY TRACT INFECTION, ACUTE 10/21/2008  . TINEA PEDIS 09/14/2008  . Secondary neuroendocrine tumors ([place]) 07/22/2008  . ABRASION 03/16/2008  . Type 2 diabetes mellitus with hyperlipidemia ([place]) 05/18/2007  . Hyperlipidemia 05/18/2007  . ANGER 05/18/2007  . Essential hypertension 05/18/2007    Past Surgical History:  Procedure Laterality Date  . ABDOMINAL HERNIA REPAIR  01/20/2014   recurrent VHR w/mesh  . CHOLECYSTECTOMY    . COLECTOMY  07/2008   "got all the cancer out"  . HERNIA REPAIR  02/2009; 01/20/2014   IHR; VHR  . INSERTION OF MESH N/A 01/20/2014   Procedure: INSERTION OF MESH;  Surgeon: [name]. [name], MD;  Location: [facility];  Service: General;  Laterality:  N/A;  . [place]   "had to open me up after they busted; cut just above pubis"  . VENTRAL HERNIA REPAIR N/A 01/20/2014   Procedure: OPEN REPAIR OF A RECURRENT VENTRAL HERNIA REPAIR;  Surgeon: [name]. [name], MD;  Location: [facility] OR;  Service: General;  Laterality: N/A;        Home Medications    Prior to Admission medications   Medication Sig Start Date End Date Taking? Authorizing Provider  empagliflozin (JARDIANCE) 25 MG TABS tablet Take 25 mg by mouth daily. 12/09/17  Yes Susy Frizzle, MD  folic acid (FOLVITE) 1 MG tablet Take 1 tablet (1 mg total) by mouth daily. 07/09/17  Yes Susy Frizzle, MD  gabapentin (NEURONTIN) 100 MG capsule Take 1 capsule (100 mg  total) by mouth 3 (three) times daily. 06/28/17  Yes Mu, Pilar Plate, MD  Insulin Glargine (LANTUS SOLOSTAR) 100 UNIT/ML Solostar Pen Inject 30 Units into the skin daily. 12/09/17  Yes Susy Frizzle, MD  metoCLOPramide (REGLAN) 10 MG tablet TAKE 1 TABLET BY MOUTH FOUR TIMES DAILY. Patient taking differently: TAKE 1 TABLET (10mg ) BY MOUTH FOUR TIMES DAILY. 07/17/17  Yes Susy Frizzle, MD  pioglitazone (ACTOS) 30 MG tablet TAKE 1 TABLET BY MOUTH DAILY 12/11/17  Yes Susy Frizzle, MD  rosuvastatin (CRESTOR) 20 MG tablet TAKE 1 TABLET BY MOUTH DAILY Patient taking differently: TAKE 1 TABLET (20mg ) BY MOUTH DAILY 07/18/17  Yes Susy Frizzle, MD  Insulin Pen Needle (PEN NEEDLES) 32G X 4 MM MISC 1 Stick by Does not apply route daily. 12/09/17   Susy Frizzle, MD  magnesium chloride (SLOW-MAG) 64 MG TBEC SR tablet Take 1 tablet (64 mg total) by mouth 2 (two) times daily. Patient not taking: Reported on 12/09/2017 01/15/17   Susy Frizzle, MD  potassium chloride SA (K-DUR,KLOR-CON) 20 MEQ tablet Take 1 tablet (20 mEq total) by mouth daily. Patient not taking: Reported on 12/09/2017 05/08/17   Susy Frizzle, MD    Family History Family History  Problem Relation Age of Onset  . Cancer Mother 14       breast cancer  . Breast cancer Mother   . Diabetes Mother   . Emphysema Father   . Diabetes Sister     Social History Social History   Tobacco Use  . Smoking status: Never Smoker  . Smokeless tobacco: Never Used  Substance Use Topics  . Alcohol use: No  . Drug use: No  Pt is on disability for "slow learner" Last drink 2 weeks ago   Allergies   Patient has no known allergies.   Review of Systems Review of Systems  All other systems reviewed and are negative.    Physical Exam Updated Vital Signs BP (!) 133/97   Pulse (!) 125   Temp 98.2 F (36.8 C) (Oral)   Resp 18   SpO2 98%   Physical Exam  Constitutional: He is oriented to person, place, and time. He appears  well-developed and well-nourished.  Non-toxic appearance. He does not appear ill. No distress.  HENT:  Head: Normocephalic and atraumatic.  Right Ear: External ear normal.  Left Ear: External ear normal.  Nose: Nose normal. No mucosal edema or rhinorrhea.  Mouth/Throat: Oropharynx is clear and moist. Mucous membranes are dry. No dental abscesses or uvula swelling.  Eyes: Pupils are equal, round, and reactive to light. EOM are normal.  Neck: Normal range of motion and full passive range of motion  without pain. Neck supple.  Cardiovascular: Normal rate, regular rhythm and normal heart sounds. Exam reveals no gallop and no friction rub.  No murmur heard. Pulmonary/Chest: Effort normal and breath sounds normal. No respiratory distress. He has no wheezes. He has no rhonchi. He has no rales. He exhibits no tenderness and no crepitus.  Abdominal: Soft. Normal appearance and bowel sounds are normal. He exhibits no distension. There is no tenderness. There is no rebound and no guarding.  Musculoskeletal: Normal range of motion. He exhibits no edema or tenderness.  Moves all extremities well.   Neurological: He is alert and oriented to person, place, and time. He has normal strength. No cranial nerve deficit.  Skin: Skin is warm, dry and intact. No rash noted. No erythema. No pallor.  Psychiatric: He has a normal mood and affect. His speech is normal and behavior is normal. His mood appears not anxious.  Nursing note and vitals reviewed.    ED Treatments / Results  Labs (all labs ordered are listed, but only abnormal results are displayed) Results for orders placed or performed during the hospital encounter of 50/53/97  Basic metabolic panel  Result Value Ref Range   Sodium 136 135 - 145 mmol/L   Potassium 3.8 3.5 - 5.1 mmol/L   Chloride 96 (L) 101 - 111 mmol/L   CO2 12 (L) 22 - 32 mmol/L   Glucose, Bld 223 (H) 65 - 99 mg/dL   BUN 23 (H) 6 - 20 mg/dL   Creatinine, Ser 1.51 (H) 0.61 - 1.24  mg/dL   Calcium 9.0 8.9 - 10.3 mg/dL   GFR calc non Af Amer 53 (L) >60 mL/min   GFR calc Af Amer >60 >60 mL/min   Anion gap 28 (H) 5 - 15  CBC  Result Value Ref Range   WBC 11.5 (H) 4.0 - 10.5 K/uL   RBC 5.45 4.22 - 5.81 MIL/uL   Hemoglobin 18.5 (H) 13.0 - 17.0 g/dL   HCT 52.3 (H) 39.0 - 52.0 %   MCV 96.0 78.0 - 100.0 fL   MCH 33.9 26.0 - 34.0 pg   MCHC 35.4 30.0 - 36.0 g/dL   RDW 12.6 11.5 - 15.5 %   Platelets 206 150 - 400 K/uL  Lipase, blood  Result Value Ref Range   Lipase 31 11 - 51 U/L  Urinalysis, Routine w reflex microscopic  Result Value Ref Range   Color, Urine YELLOW YELLOW   APPearance CLEAR CLEAR   Specific Gravity, Urine 1.022 1.005 - 1.030   pH 5.0 5.0 - 8.0   Glucose, UA >=500 (A) NEGATIVE mg/dL   Hgb urine dipstick NEGATIVE NEGATIVE   Bilirubin Urine NEGATIVE NEGATIVE   Ketones, ur 80 (A) NEGATIVE mg/dL   Protein, ur NEGATIVE NEGATIVE mg/dL   Nitrite NEGATIVE NEGATIVE   Leukocytes, UA NEGATIVE NEGATIVE   RBC / HPF 0-5 0 - 5 RBC/hpf   WBC, UA 0-5 0 - 5 WBC/hpf   Bacteria, UA NONE SEEN NONE SEEN   Squamous Epithelial / LPF NONE SEEN 0 - 5   Mucus PRESENT    Hyaline Casts, UA PRESENT   Basic metabolic panel  Result Value Ref Range   Sodium 138 135 - 145 mmol/L   Potassium 3.9 3.5 - 5.1 mmol/L   Chloride 102 101 - 111 mmol/L   CO2 13 (L) 22 - 32 mmol/L   Glucose, Bld 204 (H) 65 - 99 mg/dL   BUN 31 (H) 6 - 20 mg/dL   Creatinine,  Ser 1.52 (H) 0.61 - 1.24 mg/dL   Calcium 8.4 (L) 8.9 - 10.3 mg/dL   GFR calc non Af Amer 52 (L) >60 mL/min   GFR calc Af Amer >60 >60 mL/min   Anion gap 23 (H) 5 - 15  Troponin I (q 6hr x 3)  Result Value Ref Range   Troponin I <0.03 <0.03 ng/mL  Lactic acid, plasma  Result Value Ref Range   Lactic Acid, Venous 1.6 0.5 - 1.9 mmol/L  CBC with Differential/Platelet  Result Value Ref Range   WBC 12.2 (H) 4.0 - 10.5 K/uL   RBC 5.24 4.22 - 5.81 MIL/uL   Hemoglobin 17.6 (H) 13.0 - 17.0 g/dL   HCT 49.8 39.0 - 52.0 %   MCV  95.0 78.0 - 100.0 fL   MCH 33.6 26.0 - 34.0 pg   MCHC 35.3 30.0 - 36.0 g/dL   RDW 12.6 11.5 - 15.5 %   Platelets 186 150 - 400 K/uL   Neutrophils Relative % 80 %   Neutro Abs 9.7 (H) 1.7 - 7.7 K/uL   Lymphocytes Relative 9 %   Lymphs Abs 1.1 0.7 - 4.0 K/uL   Monocytes Relative 11 %   Monocytes Absolute 1.4 (H) 0.1 - 1.0 K/uL   Eosinophils Relative 0 %   Eosinophils Absolute 0.0 0.0 - 0.7 K/uL   Basophils Relative 0 %   Basophils Absolute 0.0 0.0 - 0.1 K/uL  I-stat troponin, ED  Result Value Ref Range   Troponin i, poc 0.01 0.00 - 0.08 ng/mL   Comment 3          I-stat troponin, ED  Result Value Ref Range   Troponin i, poc 0.00 0.00 - 0.08 ng/mL   Comment 3          I-Stat CG4 Lactic Acid, ED  Result Value Ref Range   Lactic Acid, Venous 4.79 (HH) 0.5 - 1.9 mmol/L   Comment NOTIFIED PHYSICIAN   CBG monitoring, ED  Result Value Ref Range   Glucose-Capillary 299 (H) 65 - 99 mg/dL  CBG monitoring, ED  Result Value Ref Range   Glucose-Capillary 257 (H) 65 - 99 mg/dL  CBG monitoring, ED  Result Value Ref Range   Glucose-Capillary 183 (H) 65 - 99 mg/dL  CBG monitoring, ED  Result Value Ref Range   Glucose-Capillary 196 (H) 65 - 99 mg/dL   Laboratory interpretation all normal except DKA, concentrated hemoglobin consistent with dehydration, elevated lactic acidosis    EKG EKG Interpretation  Date/Time:  Monday December 29 2017 14:13:15 EDT Ventricular Rate:  125 PR Interval:  154 QRS Duration: 88 QT Interval:  316 QTC Calculation: 456 R Axis:   -10 Text Interpretation:  Sinus tachycardia Left ventricular hypertrophy with repolarization abnormality Since last tracing rate faster 02 Jan 2017 Confirmed by Rolland Porter [phone]) on 12/30/2017 1:11:48 AM   Radiology Dg Chest 2 View  Result Date: 12/29/2017 CLINICAL DATA:  Nausea, vomiting and central chest pain that is reproducible. EXAM: CHEST - 2 VIEW COMPARISON:  01/02/2017 FINDINGS: The heart size and mediastinal contours  are within normal limits. Both lungs are clear. Stable mild degenerative change along the dorsal spine. IMPRESSION: No active cardiopulmonary disease. Electronically Signed   By: Ashley Royalty [name].   On: 12/29/2017 21:04    Procedures .Critical Care Performed by: Rolland Porter, MD Authorized by: Rolland Porter, MD   Critical care provider statement:    Critical care time (minutes):  33   Critical care was necessary  to treat or prevent imminent or life-threatening deterioration of the following conditions:  Endocrine crisis   Critical care was time spent personally by me on the following activities:  Discussions with consultants, examination of patient, obtaining history from patient or surrogate, ordering and review of laboratory studies, pulse oximetry and re-evaluation of patient's condition   (including critical care time)  Medications Ordered in ED Medications  insulin regular (NOVOLIN R,HUMULIN R) 100 Units in sodium chloride 0.9 % 100 mL (1 Units/mL) infusion (4.1 Units/hr Intravenous Rate/Dose Change 12/30/17 0650)  sodium chloride 0.9 % bolus 1,000 mL (0 mLs Intravenous Stopped 12/30/17 0322)    And  sodium chloride 0.9 % bolus 1,000 mL (0 mLs Intravenous Stopped 12/30/17 0415)    And  sodium chloride 0.9 % bolus 1,000 mL (0 mLs Intravenous Stopped 12/30/17 0548)    And  0.9 %  sodium chloride infusion (has no administration in time range)  potassium chloride 10 mEq in 100 mL IVPB (0 mEq Intravenous Stopped 12/30/17 0322)  famotidine (PEPCID) IVPB 20 mg premix (0 mg Intravenous Stopped 12/30/17 0249)  0.9 %  sodium chloride infusion (has no administration in time range)  dextrose 5 %-0.45 % sodium chloride infusion ( Intravenous New Bag/Given 12/30/17 0549)  ondansetron (ZOFRAN) injection 4 mg (4 mg Intravenous Given 12/30/17 0219)     Initial Impression / Assessment and Plan / ED Course  I have reviewed the triage vital signs and the nursing notes.  Pertinent labs & imaging results that  were available during my care of the patient were reviewed by me and considered in my medical decision making (see chart for details).     Pt had dark brown vomitus when I entered the room. It was sent to the lab for [place].   Pt started on DKA protocol with IV fluids, IV insulin drip. He was given IV pepcid for gastritis. Hemoccult was ordered.   I talked to the nursing staff about the Hemoccult and gastric called however they were not done.  Patient was maintained on his IV fluid boluses and IV insulin.  His blood sugar did improve.  We discussed he would need admission for treatment of his diabetic ketoacidosis and he is agreeable.  02:59 AM Dr Harvest Forest, hospitalist, will admit  Final Clinical Impressions(s) / ED Diagnoses   Final diagnoses:  Diabetic ketoacidosis without coma associated with other specified diabetes mellitus ([place])  Noncompliance with medications    Plan admission  Rolland Porter, MD, Barbette Or, MD 12/30/17 [phone]

## 2017-12-31 DIAGNOSIS — E870 Hyperosmolality and hypernatremia: Secondary | ICD-10-CM | POA: Diagnosis not present

## 2017-12-31 DIAGNOSIS — I10 Essential (primary) hypertension: Secondary | ICD-10-CM

## 2017-12-31 DIAGNOSIS — Z794 Long term (current) use of insulin: Secondary | ICD-10-CM | POA: Diagnosis not present

## 2017-12-31 DIAGNOSIS — E1110 Type 2 diabetes mellitus with ketoacidosis without coma: Secondary | ICD-10-CM | POA: Diagnosis not present

## 2017-12-31 LAB — CBC
HCT: 42.8 % (ref 39.0–52.0)
HEMOGLOBIN: 14.8 g/dL (ref 13.0–17.0)
MCH: 32.7 pg (ref 26.0–34.0)
MCHC: 34.6 g/dL (ref 30.0–36.0)
MCV: 94.7 fL (ref 78.0–100.0)
PLATELETS: 144 10*3/uL — AB (ref 150–400)
RBC: 4.52 MIL/uL (ref 4.22–5.81)
RDW: 12.6 % (ref 11.5–15.5)
WBC: 10.4 10*3/uL (ref 4.0–10.5)

## 2017-12-31 LAB — GLUCOSE, CAPILLARY
GLUCOSE-CAPILLARY: 137 mg/dL — AB (ref 65–99)
GLUCOSE-CAPILLARY: 141 mg/dL — AB (ref 65–99)
GLUCOSE-CAPILLARY: 142 mg/dL — AB (ref 65–99)
GLUCOSE-CAPILLARY: 161 mg/dL — AB (ref 65–99)
GLUCOSE-CAPILLARY: 171 mg/dL — AB (ref 65–99)
GLUCOSE-CAPILLARY: 336 mg/dL — AB (ref 65–99)
GLUCOSE-CAPILLARY: 99 mg/dL (ref 65–99)
Glucose-Capillary: 143 mg/dL — ABNORMAL HIGH (ref 65–99)
Glucose-Capillary: 282 mg/dL — ABNORMAL HIGH (ref 65–99)
Glucose-Capillary: 156 mg/dL — ABNORMAL HIGH (ref 65–99)
Glucose-Capillary: 138 mg/dL — ABNORMAL HIGH (ref 65–99)
Glucose-Capillary: 93 mg/dL (ref 65–99)
Glucose-Capillary: 77 mg/dL (ref 65–99)

## 2017-12-31 LAB — BASIC METABOLIC PANEL
ANION GAP: 14 (ref 5–15)
BUN: 12 mg/dL (ref 6–20)
CALCIUM: 8.2 mg/dL — AB (ref 8.9–10.3)
CO2: 27 mmol/L (ref 22–32)
CREATININE: 1.04 mg/dL (ref 0.61–1.24)
Chloride: 109 mmol/L (ref 101–111)
Glucose, Bld: 147 mg/dL — ABNORMAL HIGH (ref 65–99)
Potassium: 3.8 mmol/L (ref 3.5–5.1)
Sodium: 150 mmol/L — ABNORMAL HIGH (ref 135–145)

## 2017-12-31 LAB — MRSA PCR SCREENING: MRSA by PCR: NEGATIVE

## 2017-12-31 MED ORDER — METOCLOPRAMIDE HCL 5 MG/ML IJ SOLN
10.0000 mg | Freq: Once | INTRAMUSCULAR | Status: AC
  Administered 2017-12-31: 10 mg via INTRAVENOUS
  Filled 2017-12-31: qty 2

## 2017-12-31 MED ORDER — INSULIN ASPART 100 UNIT/ML ~~LOC~~ SOLN
0.0000 [IU] | Freq: Three times a day (TID) | SUBCUTANEOUS | Status: DC
  Administered 2017-12-31: 2 [IU] via SUBCUTANEOUS

## 2017-12-31 MED ORDER — SODIUM CHLORIDE 0.45 % IV SOLN
INTRAVENOUS | Status: DC
  Administered 2017-12-31: 09:00:00 via INTRAVENOUS

## 2017-12-31 MED ORDER — GI COCKTAIL ~~LOC~~
30.0000 mL | Freq: Once | ORAL | Status: AC
  Administered 2017-12-31: 30 mL via ORAL
  Filled 2017-12-31: qty 30

## 2017-12-31 MED ORDER — INSULIN ASPART 100 UNIT/ML ~~LOC~~ SOLN: 0.0000 [IU] | Freq: Every day | SUBCUTANEOUS | Status: DC

## 2017-12-31 MED ORDER — DEXTROSE 5 % IV SOLN
INTRAVENOUS | Status: DC
  Administered 2017-12-31 – 2018-01-01 (×2): via INTRAVENOUS

## 2017-12-31 MED ORDER — INSULIN GLARGINE 100 UNIT/ML ~~LOC~~ SOLN
30.0000 [IU] | Freq: Every day | SUBCUTANEOUS | Status: DC
  Administered 2017-12-31 – 2018-01-01 (×2): 30 [IU] via SUBCUTANEOUS
  Filled 2017-12-31 (×2): qty 0.3

## 2017-12-31 NOTE — Progress Notes (Signed)
At 0230, RN went into patient's room and he began complaining of sharp upper abdominal pain rating it 8/10 that worsened with coughing and breathing. Paged Kirby,NP and she placed one time orders for 10mg  of Reglan and GI cocktail. Medications given and patient is now sleeping. Will continue to monitor and treat per MD orders.

## 2017-12-31 NOTE — Care Management Obs Status (Signed)
[facility] NOTIFICATION   Patient Details  Name: NISHAN OVENS MRN: [id number] Date of Birth: [birth year]   Medicare Observation Status Notification Given:  Yes    Sharin Mons, RN 12/31/2017, 4:59 PM

## 2017-12-31 NOTE — Care Management CC44 (Signed)
Condition Code 44 Documentation Completed  Patient Details  Name: [name] MRN: [id number] Date of Birth: May 31, 1966   Condition Code 44 given:  Yes Patient signature on Condition Code 44 notice:  Yes Documentation of 2 MD's agreement:  Yes Code 44 added to claim:  Yes    Sharin Mons, RN 12/31/2017, 4:59 PM

## 2017-12-31 NOTE — Progress Notes (Signed)
[facility] PROGRESS NOTE  [name] TZG:[id number] DOB: 1966/08/01 DOA: 12/29/2017 PCP: Susy Frizzle, MD  Assessment/Plan:  Hypernatremia Neuro checks Serial BMPs D5 IVF  Symptomatic Diabetic Ketoacidosis (resolved) No on SSI  Dark stools and Emesis rule out Gastro Intestinal Bleed Etiology is unknown at this time ?gastritis.Emesis and nausea has resolved after antiemetics and Pepcid  Will monitor  GERD,   Continue PPI  Acute Kidney Injury likely due to dehydration  No confusion.  BL Cr  0.8, current 1.04 Follow BMET in am  Hold Ace inhibitors, till tomorrow  Hypertension BP  158/98   Pulse   119   Controlled Hold any  anti-hypertensive medications due to possible GIB     Hyperlipidemia Continue home statins   Peripheral neuropathy Continue Neurontin     DVT prophylaxis:  SCD  Code Status:    Full  Family Communication:  Discussed with patient Disposition Plan: Expect patient to be discharged to home after condition improves Consults called:    None  Admission status:  IP Stepdown    HPI/Subjective: States no pain. Asking about d/c.  Objective: Vitals:   12/31/17 0801 12/31/17 1200  BP: 136/88   Pulse: (!) 106   Resp: (!) 23   Temp: 99.1 F (37.3 C) 99.7 F (37.6 C)  SpO2: 94%     Intake/Output Summary (Last 24 hours) at 12/31/2017 2130 Last data filed at 12/31/2017 1800 Gross per 24 hour  Intake 2577.86 ml  Output 450 ml  Net 2127.86 ml   Filed Weights   12/31/17 0500  Weight: 114.5 kg (252 lb 6.8 oz)    Exam:  General: NCAT, NAD, obese Cardiovascular: RRR, no MRG Respiratory: CTAB, nl wob Abdomen: NS, BS+, NTTP Musculoskeletal: moving all extr, nl tone    Data Reviewed: Basic Metabolic Panel: Recent Labs  Lab 12/30/17 0949 12/30/17 1612 12/30/17 1909 12/30/17 2221 12/31/17 0321  NA 138 139 135 132* 150*  K 3.5 4.6 4.1 3.7 3.8  CL 103 105 101 99* 109  CO2 18* 17* 16* 17* 27  GLUCOSE 174* 148* 147*  165* 147*  BUN 27* 22* 19 17 12   CREATININE 1.32* 1.10 1.10 1.14 1.04  CALCIUM 8.5* 8.7* 8.5* 8.4* 8.2*   Liver Function Tests: No results for input(s): AST, ALT, ALKPHOS, BILITOT, PROT, ALBUMIN in the last 168 hours. Recent Labs  Lab 12/29/17 1409  LIPASE 31   No results for input(s): AMMONIA in the last 168 hours. CBC: Recent Labs  Lab 12/29/17 1409 12/30/17 0410 12/31/17 0321  WBC 11.5* 12.2* 10.4  NEUTROABS  --  9.7*  --   HGB 18.5* 17.6* 14.8  HCT 52.3* 49.8 42.8  MCV 96.0 95.0 94.7  PLT 206 186 144*   Cardiac Enzymes: Recent Labs  Lab 12/30/17 0410 12/30/17 0949 12/30/17 1611  TROPONINI <0.03 <0.03 <0.03   BNP (last 3 results) Recent Labs    06/28/17 1332 12/30/17 1632  BNP 10.1 65.3    ProBNP (last 3 results) No results for input(s): PROBNP in the last 8760 hours.  CBG: Recent Labs  Lab 12/31/17 0654 12/31/17 0756 12/31/17 0855 12/31/17 1243 12/31/17 1754  GLUCAP 282* 156* 138* 99 93    Recent Results (from the past 240 hour(s))  MRSA PCR Screening     Status: None   Collection Time: 12/30/17  7:38 PM  Result Value Ref Range Status   MRSA by PCR NEGATIVE NEGATIVE Final    Comment:  The GeneXpert MRSA Assay (FDA approved for NASAL specimens only), is one component of a comprehensive MRSA colonization surveillance program. It is not intended to diagnose MRSA infection nor to guide or monitor treatment for MRSA infections. Performed at [facility], [place] [address], [place], [place] [address]      Studies: No results found.  Scheduled Meds: . folic acid  1 mg Oral Daily  . gabapentin  100 mg Oral TID  . insulin aspart  0-15 Units Subcutaneous TID WC  . insulin aspart  0-5 Units Subcutaneous QHS  . insulin glargine  30 Units Subcutaneous Daily  . rosuvastatin  20 mg Oral Daily   Continuous Infusions: . dextrose    . famotidine (PEPCID) IV Stopped (12/31/17 8337)  . insulin (NOVOLIN-R) infusion Stopped (12/31/17  0850)    Principal Problem:   DKA, type 2 ([place]) Active Problems:   Type 2 diabetes mellitus with hyperlipidemia ([place])   Hyperlipidemia   Essential hypertension   GERD   History of malignant carcinoid tumor    Time spent: [facility] Pager AMION. If 7PM-7AM, please contact night-coverage at [email or web address], password [facility] 12/31/2017, 9:30 PM  LOS: 1 day

## 2018-01-01 DIAGNOSIS — Z794 Long term (current) use of insulin: Secondary | ICD-10-CM | POA: Diagnosis not present

## 2018-01-01 DIAGNOSIS — E1310 Other specified diabetes mellitus with ketoacidosis without coma: Secondary | ICD-10-CM

## 2018-01-01 DIAGNOSIS — E785 Hyperlipidemia, unspecified: Secondary | ICD-10-CM

## 2018-01-01 DIAGNOSIS — E876 Hypokalemia: Secondary | ICD-10-CM | POA: Diagnosis not present

## 2018-01-01 DIAGNOSIS — I10 Essential (primary) hypertension: Secondary | ICD-10-CM | POA: Diagnosis not present

## 2018-01-01 DIAGNOSIS — E1169 Type 2 diabetes mellitus with other specified complication: Secondary | ICD-10-CM

## 2018-01-01 DIAGNOSIS — Z859 Personal history of malignant neoplasm, unspecified: Secondary | ICD-10-CM

## 2018-01-01 DIAGNOSIS — N179 Acute kidney failure, unspecified: Secondary | ICD-10-CM | POA: Diagnosis not present

## 2018-01-01 DIAGNOSIS — E1110 Type 2 diabetes mellitus with ketoacidosis without coma: Secondary | ICD-10-CM | POA: Diagnosis not present

## 2018-01-01 DIAGNOSIS — K219 Gastro-esophageal reflux disease without esophagitis: Secondary | ICD-10-CM | POA: Diagnosis not present

## 2018-01-01 LAB — BASIC METABOLIC PANEL
Anion gap: 11 (ref 5–15)
Anion gap: 13 (ref 5–15)
BUN: 7 mg/dL (ref 6–20)
BUN: 8 mg/dL (ref 6–20)
CHLORIDE: 100 mmol/L — AB (ref 101–111)
CHLORIDE: 99 mmol/L — AB (ref 101–111)
CO2: 22 mmol/L (ref 22–32)
CO2: 24 mmol/L (ref 22–32)
Calcium: 8.1 mg/dL — ABNORMAL LOW (ref 8.9–10.3)
Calcium: 8.2 mg/dL — ABNORMAL LOW (ref 8.9–10.3)
Creatinine, Ser: 0.88 mg/dL (ref 0.61–1.24)
Creatinine, Ser: 1.01 mg/dL (ref 0.61–1.24)
GFR calc Af Amer: >60 mL/min (ref >60)
GFR calc Af Amer: >60 mL/min (ref >60)
GFR calc non Af Amer: >60 mL/min (ref >60)
GLUCOSE: 83 mg/dL (ref 65–99)
GLUCOSE: 88 mg/dL (ref 65–99)
POTASSIUM: 2.5 mmol/L — AB (ref 3.5–5.1)
POTASSIUM: 2.6 mmol/L — AB (ref 3.5–5.1)
Sodium: 134 mmol/L — ABNORMAL LOW (ref 135–145)
Sodium: 135 mmol/L (ref 135–145)

## 2018-01-01 LAB — CBC WITH DIFFERENTIAL/PLATELET
Basophils Absolute: 0 10*3/uL (ref 0.0–0.1)
Basophils Relative: 0 %
EOS PCT: 1 %
Eosinophils Absolute: 0 10*3/uL (ref 0.0–0.7)
HEMATOCRIT: 40 % (ref 39.0–52.0)
Hemoglobin: 14.2 g/dL (ref 13.0–17.0)
LYMPHS ABS: 1.8 10*3/uL (ref 0.7–4.0)
LYMPHS PCT: 33 %
MCH: 33.4 pg (ref 26.0–34.0)
MCHC: 35.5 g/dL (ref 30.0–36.0)
MCV: 94.1 fL (ref 78.0–100.0)
Monocytes Absolute: 0.3 10*3/uL (ref 0.1–1.0)
Monocytes Relative: 6 %
NEUTROS ABS: 3.3 10*3/uL (ref 1.7–7.7)
Neutrophils Relative %: 60 %
PLATELETS: 125 10*3/uL — AB (ref 150–400)
RBC: 4.25 MIL/uL (ref 4.22–5.81)
RDW: 12.3 % (ref 11.5–15.5)
WBC: 5.5 10*3/uL (ref 4.0–10.5)

## 2018-01-01 LAB — MAGNESIUM: Magnesium: 1.9 mg/dL (ref 1.7–2.4)

## 2018-01-01 LAB — GLUCOSE, CAPILLARY
GLUCOSE-CAPILLARY: 104 mg/dL — AB (ref 65–99)
Glucose-Capillary: 112 mg/dL — ABNORMAL HIGH (ref 65–99)

## 2018-01-01 LAB — POTASSIUM: Potassium: 3.4 mmol/L — ABNORMAL LOW (ref 3.5–5.1)

## 2018-01-01 MED ORDER — POTASSIUM CHLORIDE CRYS ER 20 MEQ PO TBCR
40.0000 meq | EXTENDED_RELEASE_TABLET | ORAL | Status: AC
  Administered 2018-01-01 (×2): 40 meq via ORAL
  Filled 2018-01-01 (×2): qty 2

## 2018-01-01 MED ORDER — FAMOTIDINE 20 MG PO TABS: 20.0000 mg | ORAL_TABLET | Freq: Two times a day (BID) | ORAL | 0 refills | Status: DC

## 2018-01-01 MED ORDER — ALUM & MAG HYDROXIDE-SIMETH 200-200-20 MG/5ML PO SUSP
30.0000 mL | Freq: Four times a day (QID) | ORAL | Status: DC | PRN
  Administered 2018-01-01: 30 mL via ORAL
  Filled 2018-01-01: qty 30

## 2018-01-01 MED ORDER — POTASSIUM CHLORIDE 10 MEQ/100ML IV SOLN
10.0000 meq | INTRAVENOUS | Status: AC
  Administered 2018-01-01 (×4): 10 meq via INTRAVENOUS
  Filled 2018-01-01 (×3): qty 100

## 2018-01-01 MED ORDER — POTASSIUM CHLORIDE CRYS ER 20 MEQ PO TBCR
40.0000 meq | EXTENDED_RELEASE_TABLET | ORAL | Status: AC
  Administered 2018-01-01 (×2): 40 meq via ORAL
  Filled 2018-01-01 (×3): qty 2

## 2018-01-01 NOTE — Evaluation (Signed)
Physical Therapy Evaluation Patient Details Name: [name] MRN: [id number] DOB: 10-Jan-1966 Today's Date: 01/01/2018   History of Present Illness  52yo male presenting to the ED with chest discomfort, brown emesis, exertional SOB, and who ran out of his insulin 1 week ago. Diagnosed with diabetic DKA, possible GI bleed, AKI. PMH colon CA, HTN, DM, colectomy 2009, hx enterocolitis   Clinical Impression   Noted low potassium, spoke to RN who reports patient has been receiving replenishment and should be safe to participate in PT. Patient received in bed, pleasant and willing to participate in skilled PT services this morning; of note he reports use of a walking stick at home as well as multiple losses of balance that family has had to intervene in to prevent a fall. He is able to perform bed mobility with Mod(I) and functional transfers and gait with S with RW, gait pattern steady with use of assistive device however he reports that RW would likely not fit inside of his mobile home. He was left in bed with all needs met, RN present and attending. He will continue to benefit from skilled PT services in the acute setting as well as skilled HHPT services to further address functional deficits and fall risk moving forward.     Follow Up Recommendations Home health PT    Equipment Recommendations  Rolling walker with 5" wheels;Cane;Other (comment)(would potentially benefit from 2 canes (one for each side))    Recommendations for Other Services       Precautions / Restrictions Precautions Precautions: Fall Restrictions Weight Bearing Restrictions: No      Mobility  Bed Mobility Overal bed mobility: Modified Independent             General bed mobility comments: extended time, increased effort   Transfers Overall transfer level: Needs assistance Equipment used: Rolling walker (2 wheeled) Transfers: Sit to/from Stand Sit to Stand: Supervision         General transfer comment:  S for safety, cues for hand placement   Ambulation/Gait Ambulation/Gait assistance: Supervision Ambulation Distance (Feet): 250 Feet Assistive device: Rolling walker (2 wheeled) Gait Pattern/deviations: Step-through pattern;Trunk flexed     General Gait Details: steady at self-selected pace with RW  Stairs            Wheelchair Mobility    Modified Rankin (Stroke Patients Only)       Balance Overall balance assessment: Needs assistance;History of Falls Sitting-balance support: Bilateral upper extremity supported;Feet supported Sitting balance-Leahy Scale: Good     Standing balance support: During functional activity;No upper extremity supported Standing balance-Leahy Scale: Fair Standing balance comment: grossly unsteady and history of multiple falls at home                              Pertinent Vitals/Pain Pain Assessment: No/denies pain    Home Living Family/patient expects to be discharged to:: Private residence Living Arrangements: Children Available Help at Discharge: Family;Available PRN/intermittently Type of Home: Mobile home Home Access: Stairs to enter Entrance Stairs-Rails: Right Entrance Stairs-Number of Steps: 3 Home Layout: One level Home Equipment: Other (comment)(walking stick )      Prior Function Level of Independence: Independent         Comments: does not drive      Hand Dominance        Extremity/Trunk Assessment        Lower Extremity Assessment Lower Extremity Assessment: Overall WFL for tasks assessed  Cervical / Trunk Assessment Cervical / Trunk Assessment: Kyphotic  Communication   Communication: No difficulties  Cognition Arousal/Alertness: Awake/alert Behavior During Therapy: WFL for tasks assessed/performed Overall Cognitive Status: Within Functional Limits for tasks assessed                                        General Comments      Exercises     Assessment/Plan     PT Assessment Patient needs continued PT services  PT Problem List Decreased safety awareness;Decreased coordination;Decreased balance       PT Treatment Interventions DME instruction;Therapeutic activities;Gait training;Therapeutic exercise;Patient/family education;Stair training;Balance training;Functional mobility training;Neuromuscular re-education    PT Goals (Current goals can be found in the Care Plan section)  Acute Rehab PT Goals Patient Stated Goal: to go home  PT Goal Formulation: With patient Time For Goal Achievement: 01/15/18 Potential to Achieve Goals: Good    Frequency Min 3X/week   Barriers to discharge        Co-evaluation               AM-PAC PT "6 Clicks" Daily Activity  Outcome Measure Difficulty turning over in bed (including adjusting bedclothes, sheets and blankets)?: A Little Difficulty moving from lying on back to sitting on the side of the bed? : A Little Difficulty sitting down on and standing up from a chair with arms (e.g., wheelchair, bedside commode, etc,.)?: A Little Help needed moving to and from a bed to chair (including a wheelchair)?: A Little Help needed walking in hospital room?: A Little Help needed climbing 3-5 steps with a railing? : A Little 6 Click Score: 18    End of Session   Activity Tolerance: Patient tolerated treatment well Patient left: in bed;with call bell/phone within reach;with bed alarm set Nurse Communication: Mobility status PT Visit Diagnosis: Unsteadiness on feet (R26.81);History of falling (Z91.81)    Time: 1210-1220 PT Time Calculation (min) (ACUTE ONLY): 10 min   Charges:   PT Evaluation $PT Eval Low Complexity: 1 Low     PT G Codes:        Deniece Ree PT, DPT, CBIS  Supplemental Physical Therapist [place]   Pager [phone]

## 2018-01-01 NOTE — Progress Notes (Signed)
Pt given discharge instructions, prescriptions, and care notes. Pt verbalized understanding AEB no further questions or concerns at this time. IV was discontinued, no redness, pain, or swelling noted at this time. Telemetry discontinued and Centralized Telemetry was notified. Pt left the floor via wheelchair with staff in stable condition. 

## 2018-01-01 NOTE — Progress Notes (Signed)
CRITICAL VALUE ALERT  Critical Value:  Potassium 2.5  Date & Time Notied:  0539  Provider Notified: Baltazar Najjar  Orders Received/Actions taken: Additional  Dose 57meq of Potassium

## 2018-01-01 NOTE — Progress Notes (Signed)
CRITICAL VALUE ALERT  Critical Value:  Potassium 2.6  Date & Time Notied:  01/01/18 0048  Provider Notified: Baltazar Najjar [phone]  Orders Received/Actions taken: 40 meq Potassium q4 hr  x2

## 2018-01-01 NOTE — Consult Note (Signed)
            [facility] CM Primary Care Navigator  01/01/2018  [name] 1966-04-04 [id number]   Wentto seepatient at the bedside to identify possible discharge needs but he was alreadydischargedperstaff report.  Patient was discharged hometoday with home health services.  Per chart review, patient was admitted for chest pain and vomiting (possible gastritis, diabetic ketoacidosis- poor control and non compliance)  Primary care provider's office called ([place]/ [place]/ [place]) to notify of patient's discharge and need for post hospital follow-up and transition of care. Notified of patient's health issues needing follow-up (mainly DM, diabetic education- needs improved compliance).  Made aware to refer patient to [facility] care management if deemed necessary or appropriate for services.   For additional questions please contact:  Edwena Felty A. Graceann Boileau, BSN, RN-BC [facility] PRIMARY CARE Navigator Cell: [phone]

## 2018-01-05 ENCOUNTER — Telehealth: Payer: Self-pay | Admitting: Family Medicine

## 2018-01-05 MED ORDER — GLUCOSE BLOOD VI STRP: ORAL_STRIP | 3 refills | Status: DC

## 2018-01-05 MED ORDER — INSULIN GLARGINE 100 UNIT/ML SOLOSTAR PEN: 30.0000 [IU] | PEN_INJECTOR | Freq: Every day | SUBCUTANEOUS | 2 refills | Status: DC

## 2018-01-05 MED ORDER — ONETOUCH DELICA LANCING DEV MISC: 3 refills | Status: DC

## 2018-01-05 MED ORDER — ONETOUCH DELICA LANCETS FINE MISC: 3 refills | Status: DC

## 2018-01-05 MED ORDER — ONETOUCH ULTRA 2 W/DEVICE KIT: PACK | 1 refills | Status: DC

## 2018-01-05 NOTE — Telephone Encounter (Signed)
Received phone call from Otila Kluver from [facility] about pt - CB# [phone] ext: [phone]

## 2018-01-05 NOTE — Telephone Encounter (Signed)
Called and spoke to pt and he has an apt with Dr. Dennard Schaumann 01/09/18 for hosp f/u. Pt needing dm testing supplies - will send to pharm.

## 2018-01-05 NOTE — Telephone Encounter (Signed)
Called pt on [phone] (M) [facility] tried to call pt on [phone] (H) - incorrect number states number can not be completed as dialed - called x 2

## 2018-01-06 NOTE — Telephone Encounter (Signed)
Message left on vm regarding conservation with pt and testing supplies sent to pharm. If further information is needed she can return my call.

## 2018-01-09 ENCOUNTER — Ambulatory Visit (INDEPENDENT_AMBULATORY_CARE_PROVIDER_SITE_OTHER): Payer: Medicare Other | Admitting: Family Medicine

## 2018-01-09 ENCOUNTER — Encounter: Payer: Self-pay | Admitting: Family Medicine

## 2018-01-09 VITALS — BP 86/58 | HR 102 | Temp 98.5°F | Resp 16 | Ht 71.0 in | Wt 240.0 lb

## 2018-01-09 DIAGNOSIS — Z794 Long term (current) use of insulin: Secondary | ICD-10-CM

## 2018-01-09 DIAGNOSIS — Z9119 Patient's noncompliance with other medical treatment and regimen: Secondary | ICD-10-CM | POA: Diagnosis not present

## 2018-01-09 DIAGNOSIS — E1010 Type 1 diabetes mellitus with ketoacidosis without coma: Secondary | ICD-10-CM

## 2018-01-09 DIAGNOSIS — Z91199 Patient's noncompliance with other medical treatment and regimen due to unspecified reason: Secondary | ICD-10-CM

## 2018-01-09 DIAGNOSIS — E876 Hypokalemia: Secondary | ICD-10-CM

## 2018-01-09 DIAGNOSIS — E1165 Type 2 diabetes mellitus with hyperglycemia: Secondary | ICD-10-CM

## 2018-01-09 DIAGNOSIS — Z09 Encounter for follow-up examination after completed treatment for conditions other than malignant neoplasm: Secondary | ICD-10-CM

## 2018-01-09 NOTE — Progress Notes (Signed)
Subjective:    Patient ID: [name], male    DOB: [birth year], 52 y.o.   MRN: [id number]  Medication Refill     12/30/16 Patient is a 52 year old white male with a history of secondary neuroendocrine tumor of the gastrointestinal tract. Referred here by his oncologist of her concerns about weight loss. 20 to the patient, his oncologist 1 him to see a gastroenterologist for colonoscopy. I last saw the patient may of 2017. Since that visit, he has lost 21 pounds by my scales. He states that he has no appetite. The smell of ffood makes him extremely nauseated. As soon as he eats, he becomes full very quickly and feels like he has to throw up. His oncologist perform a CT scan of the abdomen and pelvis which revealed mild diffuse nonspecific wall thickening of the intestines but no other abnormality was seen. He also perform lab work revealed hypokalemia with potassium of 3.1 and a bicarbonate greater than 30 suggesting significant metabolic alkalosis. Patient denies any ingestion. Last saw the patient in May of last year, his hemoglobin A1c was 6.6 indicating adequate control. He is currently on metformin. He also takes insulin however he doses himself haphazardly as he sees fit and is therefore on no standard dose of insulin. He is also supposed to be taking metoclopramide for nausea and vomiting suspected gastroparesis. He is only taking metoclopramide once a day because it makes him sleepy.  At that time, my plan was: I certainly agree with a GI referral for colonoscopy given his history of cancer. However I'll also schedule a gastric emptying study as I'm concerned the patient may have gastroparesis particular given his long history of uncontrolled diabetes mellitus. Given his weight loss, I will also check a TSH. I will temporarily have the patient discontinue metformin due to his potential GI upset. I'll also evaluate the patient by repeating a CMP to determine if his metabolic colic alkalosis  persist. I will also recheck his potassium. Causes of metabolic alkalosis include severe hypokalemia, contraction alkalosis particularly given his poor by mouth intake.  He denies laxative abuse.  He is not taking any loop or thiazide diuretic.  01/13/17 Patient was found to have severe metabolic alkalosis with a bicarbonate level greater than 35. He was also found to be hypokalemic and also have a low magnesium.  Unfortunately, before he can start outpatient replacement, he was admitted the hospital due to weakness in his legs. He states that his legs simply fell asleep on him and his muscles would not work. In the hospital he was given potassium and magnesium and was discharged home. Gastric emptying study performed in the hospital revealed no evidence of gastroparesis. He is currently taking potassium but is on no magnesium. He is also not taking anything to address his metabolic alkalosis. He states that his gastrointestinal difficulties have improved after potassium and magnesium however he continues to have weakness in the muscles of his legs bilaterally.  At that time, my plan was: I truly believe that the patient's electrolyte abnormalities of the cause of all of his symptoms. I had the patient discontinue metformin due to an elevated lactic acid level in his blood as well as a gastrointestinal side effects and the fact his hemoglobin A1c was well controlled. He is currently taking Lantus insulin and his fasting blood sugars are around 130 per his report today. Therefore this is sufficient. I want him to continue potassium. I will recheck his potassium and his magnesium level.  I suspect the patient will need chronic magnesium supplementation as well. I believe once we get the potassium and magnesium regulated, his muscle weakness in his legs will improve. I will also recheck his bicarbonate level and if still elevated, the patient will likely benefit from acetazolamide for metabolic  alkalosis  02/14/36 Admission on 12/29/2017, Discharged on 01/01/2018  No results displayed because visit has over 200 results.      Commended that the patient take Slow-Mag and continue potassium for 1 week and then allow [name] to recheck his electrolytes. He is here today for follow-up albeit a little bit later.  He feels much better since his electrolytes have normalized and his bicarbonate level is back to normal. The intestinal problems that he originally presented with are much better. He is now tolerating by mouth without difficulty. He denies any muscle weakness. His biggest complaint now is pain in his right knee that occurred after the fall. I reviewed the x-ray findings from the hospital in April. X-ray on April 26 reveal no fracture in the knee. He does have a small joint effusion and a bruise over the anterior tibial tubercle. He declines a cortisone injection in the knee today to treat in the spring. He believes that he would like to give it more time to resolve on its own.  At that time,my plan was: The majority of his symptoms are back to his baseline now. Recheck potassium, magnesium, and bicarbonate to determine the length of repletion that is necessary. I believe the pain in his knee is secondary to a knee sprain that occurred when he fell. However he declines a cortisone injection for that today. He can take over-the-counter anti-inflammatories for pain if necessary  12/09/17 Patient has not been seen since.  Apparently he stopped his insulin more than a month ago.  This was done simply because he does not have needles.  He has Lantus pens at home but no needles.  He has no transportation to get the needles.  However he has been to the hospital several times according to the patient recently because his legs are swelling.  He denies any shortness of breath.  He denies any chest pain.  On exam today, there is trace pretibial edema bilaterally.  There is no substantial swelling that can be  seen.  However the patient is extremely concerned about this.  He states that no one can tell him what is wrong.  Of note he is on Actos for his diabetes.  I reviewed the CT scan that was obtained at the hospital in March.  Also reviewed his lab work which was significant for sugars greater than 300 and profound glucosuria.  I explained to the patient that we must control his sugars, however he continues to fixate on the leg swelling.  At that time, my plan was: I will obtain his baseline fasting labs today including a hemoglobin A1c which will reflect his sugar control for the last 3 months and also show what is happened over the last month off insulin.  I anticipate a hemoglobin A1c greater than 12.  I will also check a fasting lipid panel, urine microalbumin as well.  I explained to the patient that the leg swelling is likely coming from the Actos.  He therefore wants to stop the medication.  I explained to him that he must then get on something else to control his sugars to prevent diabetic complications and hospitalization.  Therefore I gave him samples I had of  Zeb Comfort which also includes needles until he can get to the pharmacy to get his needles for his Lantus.  He can take the equivalent dose of 30 units a day which is what he was taking of Lantus until he can get to the pharmacy.  I also want him to replace Actos with Jardiance 25 mg a day which I think will help control his sugars and account for the change in medication as the patient now refuses to take Actos due to the leg swelling  01/09/18 Patient was recently discharged from the hospital April 25 for diabetic ketoacidosis.  He had discontinued his insulin and has not resumed it even after I encouraged the patient at his last visit to do so.  Unfortunately there is no discharge summary available for my review as it has not been completed.  Patient is here today for hospital follow-up.  The patient's med list is extremely confusing.  He has a bag of  medications with him some of which he is taking some of which he is not taking.  Patient states that he was told at the hospital to resume everything that was on his medicine list.  Previously he had been taking Actos but we had discontinued this in place of Jardiance.  Now he states he is taking both.  However he is not sure because another family member is putting his medication in his pillbox for him.  He is uncertain if he is taking his potassium or his magnesium.  His blood pressure today is extremely low and he is mildly tachycardic.  His mucous membranes appear dry.  Patient appears dehydrated.  He denies any chest pain shortness of breath dizziness syncope or near syncope.  He denies any dysuria or urgency or frequency.  He denies any fevers or chills.  He denies any cough.  Past Medical History:  Diagnosis Date  . Cancer [facility]) carcinoid  . Colon cancer ([place])   . GERD (gastroesophageal reflux disease)   . Hyperlipemia   . Hypertension   . Kidney stones   . Shortness of breath    with exertion  . Type II diabetes mellitus ([place])   . UTI (lower urinary tract infection)   . Ventral hernia    Past Surgical History:  Procedure Laterality Date  . ABDOMINAL HERNIA REPAIR  01/20/2014   recurrent VHR w/mesh  . CHOLECYSTECTOMY    . COLECTOMY  07/2008   "got all the cancer out"  . HERNIA REPAIR  02/2009; 01/20/2014   IHR; VHR  . INSERTION OF MESH N/A 01/20/2014   Procedure: INSERTION OF MESH;  Surgeon: [name]. [name], MD;  Location: [facility];  Service: General;  Laterality: N/A;  . [place]   "had to open me up after they busted; cut just above pubis"  . VENTRAL HERNIA REPAIR N/A 01/20/2014   Procedure: OPEN REPAIR OF A RECURRENT VENTRAL HERNIA REPAIR;  Surgeon: [name]. [name], MD;  Location: [facility] [facility];  Service: General;  Laterality: N/A;   Current Outpatient Medications on File Prior to Visit  Medication Sig Dispense Refill  . Blood Glucose Monitoring Suppl (ONE TOUCH ULTRA  2) w/Device KIT Check BS BID 1 each 1  . empagliflozin (JARDIANCE) 25 MG TABS tablet Take 25 mg by mouth daily. 30 tablet 11  . famotidine (PEPCID) 20 MG tablet Take 1 tablet (20 mg total) by mouth 2 (two) times daily for 14 days. 28 tablet 0  . folic acid (FOLVITE) 1 MG  tablet Take 1 tablet (1 mg total) by mouth daily. 30 tablet 2  . gabapentin (NEURONTIN) 100 MG capsule Take 1 capsule (100 mg total) by mouth 3 (three) times daily. 90 capsule 0  . glucose blood (ONE TOUCH ULTRA TEST) test strip Check BS bid 200 each 3  . Insulin Glargine (LANTUS SOLOSTAR) 100 UNIT/ML Solostar Pen Inject 30 Units into the skin daily. 15 mL 2  . Insulin Pen Needle (PEN NEEDLES) 32G X 4 MM MISC 1 Stick by Does not apply route daily. 100 each 5  . Lancet Devices (ONE TOUCH DELICA LANCING DEV) MISC Check bs bid 200 each 3  . metoCLOPramide (REGLAN) 10 MG tablet TAKE 1 TABLET BY MOUTH FOUR TIMES DAILY. (Patient taking differently: TAKE 1 TABLET (39m) BY MOUTH FOUR TIMES DAILY.) 120 tablet 0  . ONETOUCH DELICA LANCETS FINE MISC Check bs bid 200 each 3  . pioglitazone (ACTOS) 30 MG tablet TAKE 1 TABLET BY MOUTH DAILY 30 tablet 0  . rosuvastatin (CRESTOR) 20 MG tablet TAKE 1 TABLET BY MOUTH DAILY (Patient taking differently: TAKE 1 TABLET (268m BY MOUTH DAILY) 90 tablet 0   No current facility-administered medications on file prior to visit.    No Known Allergies Social History   Socioeconomic History  . Marital status: Single    Spouse name: Not on file  . Number of children: Not on file  . Years of education: Not on file  . Highest education level: Not on file  Occupational History  . Not on file  Social Needs  . Financial resource strain: Not on file  . Food insecurity:    Worry: Not on file    Inability: Not on file  . Transportation needs:    Medical: Not on file    Non-medical: Not on file  Tobacco Use  . Smoking status: Never Smoker  . Smokeless tobacco: Never Used  Substance and Sexual  Activity  . Alcohol use: No  . Drug use: No  . Sexual activity: Not Currently  Lifestyle  . Physical activity:    Days per week: Not on file    Minutes per session: Not on file  . Stress: Not on file  Relationships  . Social connections:    Talks on phone: Not on file    Gets together: Not on file    Attends religious service: Not on file    Active member of club or organization: Not on file    Attends meetings of clubs or organizations: Not on file    Relationship status: Not on file  . Intimate partner violence:    Fear of current or ex partner: Not on file    Emotionally abused: Not on file    Physically abused: Not on file    Forced sexual activity: Not on file  Other Topics Concern  . Not on file  Social History Narrative  . Not on file   Family History  Problem Relation Age of Onset  . Cancer Mother [id number]     breast cancer  . Breast cancer Mother   . Diabetes Mother   . Emphysema Father   . Diabetes Sister     Review of Systems  All other systems reviewed and are negative.      Objective:   Physical Exam  Constitutional: He is oriented to person, place, and time. He appears well-developed and well-nourished. No distress.  HENT:  Head: Normocephalic and atraumatic.  Right Ear: External ear normal.  Left Ear: External ear normal.  Nose: Nose normal.  Mouth/Throat: Mucous membranes are dry. No oropharyngeal exudate.  Eyes: Pupils are equal, round, and reactive to light. Conjunctivae and EOM are normal. Right eye exhibits no discharge. Left eye exhibits no discharge. No scleral icterus.  Neck: Normal range of motion. Neck supple. No JVD present. No tracheal deviation present. No thyromegaly present.  Cardiovascular: Regular rhythm, normal heart sounds and intact distal pulses. Tachycardia present. Exam reveals no gallop and no friction rub.  No murmur heard. Pulmonary/Chest: Effort normal and breath sounds normal. No stridor. No respiratory distress. He has no  wheezes. He has no rales. He exhibits no tenderness.  Abdominal: Soft. Bowel sounds are normal. He exhibits no distension and no mass. There is no tenderness. There is no rebound and no guarding.  Musculoskeletal: Normal range of motion. He exhibits no edema or tenderness.  Lymphadenopathy:    He has no cervical adenopathy.  Neurological: He is alert and oriented to person, place, and time. He has normal reflexes. No cranial nerve deficit. He exhibits normal muscle tone. Coordination normal.  Skin: Skin is warm. No rash noted. He is not diaphoretic. No erythema. No pallor.  Psychiatric: He has a normal mood and affect. His behavior is normal. Judgment and thought content normal.  Vitals reviewed.         Assessment & Plan:  Hospital discharge follow-up - Plan: CBC with Differential/Platelet, COMPLETE METABOLIC PANEL WITH GFR, Magnesium  Patient appears dehydrated.  Furthermore I am uncertain of exactly what this patient is taking.  He is noncompliant with medication.  Therefore we will essentially start from scratch.  I have asked the patient to discontinue Jardiance given his dry mucous membranes and his apparent dehydration.  I have asked him to push water and push fluids over the weekend.  I want him to continue Actos.  He states that he has been taking Lantus 40 units a day.  I have encouraged him to continue Lantus 40 units a day.  I have asked him to check his fasting blood sugars and 2-hour postprandial sugars and record them on a piece of paper and bring them in in 1 week for me in him to review so that we can uptitrate his insulin.  He is not taking his magnesium or his potassium he thinks.  Therefore I will check a CMP today along with a magnesium level.  I will likely repeat this at his follow-up in 1 week to ensure that he does not need electrolyte replacement given his recent DKA.  His blood pressure today is extremely low however he is asymptomatic and I believe is due to dehydration.   I believe this can correct if the patient will be compliant and drink plenty of fluids.  If his symptoms worsen, he is told to go the emergency room.  Next week I will slowly start to resume his other medication once I have a better grip on his electrolytes and exactly how his sugars are running.  Recheck in 1 week

## 2018-01-10 LAB — CBC WITH DIFFERENTIAL/PLATELET
BASOS ABS: 29 {cells}/uL (ref 0–200)
BASOS PCT: 0.5 %
Eosinophils Absolute: 80 cells/uL (ref 15–500)
Eosinophils Relative: 1.4 %
HEMATOCRIT: 47.2 % (ref 38.5–50.0)
HEMOGLOBIN: 16.7 g/dL (ref 13.2–17.1)
LYMPHS ABS: 1927 {cells}/uL (ref 850–3900)
MCH: 32.7 pg (ref 27.0–33.0)
MCHC: 35.4 g/dL (ref 32.0–36.0)
MCV: 92.5 fL (ref 80.0–100.0)
MPV: 12.9 fL — ABNORMAL HIGH (ref 7.5–12.5)
Monocytes Relative: 10.1 %
NEUTROS ABS: 3089 {cells}/uL (ref 1500–7800)
Neutrophils Relative %: 54.2 %
PLATELETS: 200 10*3/uL (ref 140–400)
RBC: 5.1 10*6/uL (ref 4.20–5.80)
RDW: 12.2 % (ref 11.0–15.0)
Total Lymphocyte: 33.8 %
WBC mixed population: 576 cells/uL (ref 200–950)
WBC: 5.7 10*3/uL (ref 3.8–10.8)

## 2018-01-10 LAB — COMPLETE METABOLIC PANEL WITH GFR
AG Ratio: 1.4 (calc) (ref 1.0–2.5)
ALKALINE PHOSPHATASE (APISO): 96 U/L (ref 40–115)
ALT: 20 U/L (ref 9–46)
AST: 20 U/L (ref 10–40)
Albumin: 4 g/dL (ref 3.6–5.1)
BILIRUBIN TOTAL: 0.9 mg/dL (ref 0.2–1.2)
BUN: 9 mg/dL (ref 7–25)
CO2: 29 mmol/L (ref 20–32)
Calcium: 9.2 mg/dL (ref 8.6–10.3)
Chloride: 94 mmol/L — ABNORMAL LOW (ref 98–110)
Creat: 0.94 mg/dL (ref 0.60–1.35)
GFR, Est African American: 110 mL/min/{1.73_m2} (ref >=60)
GFR, Est Non African American: 95 mL/min/{1.73_m2} (ref >=60)
GLOBULIN: 2.8 g/dL (ref 1.9–3.7)
Glucose, Bld: 199 mg/dL — ABNORMAL HIGH (ref 65–99)
Potassium: 3 mmol/L — ABNORMAL LOW (ref 3.5–5.3)
SODIUM: 135 mmol/L (ref 135–146)
Total Protein: 6.8 g/dL (ref 6.1–8.1)

## 2018-01-10 LAB — MAGNESIUM: MAGNESIUM: 1.6 mg/dL (ref 1.5–2.5)

## 2018-01-13 ENCOUNTER — Ambulatory Visit: Payer: Medicare Other | Admitting: Family Medicine

## 2018-01-15 ENCOUNTER — Encounter: Payer: Self-pay | Admitting: Family Medicine

## 2018-01-16 ENCOUNTER — Ambulatory Visit: Payer: Medicare Other | Admitting: Family Medicine

## 2018-02-20 DIAGNOSIS — N179 Acute kidney failure, unspecified: Secondary | ICD-10-CM

## 2018-02-20 NOTE — Discharge Summary (Signed)
Physician Discharge Summary  [name] JME:[id number] DOB: 1966/03/03 DOA: 12/29/2017  PCP: Susy Frizzle, MD  Admit date: 12/29/2017 Discharge date: 01/01/2018  Admitted From: Home Discharge disposition: Home   Recommendations for Outpatient Follow-Up:   1. Discharged home with home health services.   Discharge Diagnosis:   Principal Problem:   DKA, type 2 ([place]) Active Problems:   Type 2 diabetes mellitus with hyperlipidemia (HCC)   Hyperlipidemia   Essential hypertension   GERD   History of malignant carcinoid tumor   Acute kidney injury   Hypokalemia   Peripheral neuropathy  Discharge Condition: Improved.  Diet recommendation: Carbohydrate-modified.  Code status: Full.  History of Present Illness:   52 year old man with a PMH of diabetes, hypertension, hyperlipidemia and colon cancer admitted 12/29/2017 for treatment of DKA after running out of his insulin 1 week prior to admission.  Hospital Course by Problem:   Principal Problem:   DKA, type 2 ([place]) Treated with an insulin drip until anion gap closed, subsequently transitioned to SSI.  Active Problems:   Type 2 diabetes mellitus with hyperlipidemia (HCC)    Hyperlipidemia Continue statin.    Essential hypertension Antihypertensives initially held.  Resumed upon discharge.    GERD Continue PPI.    Acute kidney injury Thought to be from dehydration in the setting of osmotic diuresis.  Baseline creatinine 0.8.  Creatinine improved to baseline at discharge.    Hypokalemia Potassium repleted prior to discharge.    Peripheral neuropathy Continue neurontin.    History of malignant carcinoid tumor   Medical Consultants:    None.   Discharge Exam:   Vitals:   01/01/18 0542 01/01/18 0911  BP: 132/81 129/84  Pulse: 82 86  Resp: 18 20  Temp: 98.1 F (36.7 C) 98.8 F (37.1 C)  SpO2: 96% 98%   Vitals:   12/31/17 2149 01/01/18 0200 01/01/18 0542 01/01/18 0911  BP: (!) 157/86  140/80 132/81 129/84  Pulse: 93 79 82 86  Resp: 17  18 20   Temp: 98.1 F (36.7 C) 98.9 F (37.2 C) 98.1 F (36.7 C) 98.8 F (37.1 C)  TempSrc:  Oral  Oral  SpO2: 96% 100% 96% 98%  Weight:       PE done by Dr. Aggie Moats, the discharging MD.   The results of significant diagnostics from this hospitalization (including imaging, microbiology, ancillary and laboratory) are listed below for reference.     Procedures and Diagnostic Studies:   Dg Chest 2 View  Result Date: 12/29/2017 CLINICAL DATA:  Nausea, vomiting and central chest pain that is reproducible. EXAM: CHEST - 2 VIEW COMPARISON:  01/02/2017 FINDINGS: The heart size and mediastinal contours are within normal limits. Both lungs are clear. Stable mild degenerative change along the dorsal spine. IMPRESSION: No active cardiopulmonary disease. Electronically Signed   By: Ashley Royalty [name].   On: 12/29/2017 21:04     Labs:   Potassium 3.4, magnesium 1.9.  Otherwise unremarkable.  Discharge Instructions:   Discharge Instructions    Call MD for:  difficulty breathing, headache or visual disturbances   Complete by:  As directed    Call MD for:  extreme fatigue   Complete by:  As directed    Call MD for:  persistant dizziness or light-headedness   Complete by:  As directed    Call MD for:  persistant nausea and vomiting   Complete by:  As directed    Call MD for:  redness, tenderness, or signs of infection (pain,  swelling, redness, odor or green/yellow discharge around incision site)   Complete by:  As directed    Call MD for:  severe uncontrolled pain   Complete by:  As directed    Call MD for:  temperature >100.4   Complete by:  As directed    Diet - low sodium heart healthy   Complete by:  As directed    Carb modified   Discharge instructions   Complete by:  As directed    PCP follow-up in 1 week   Increase activity slowly   Complete by:  As directed      Allergies as of 01/01/2018   No Known Allergies       Medication List    STOP taking these medications   magnesium chloride 64 MG Tbec SR tablet Commonly known as:  SLOW-MAG   potassium chloride SA 20 MEQ tablet Commonly known as:  K-DUR,KLOR-CON     TAKE these medications   famotidine 20 MG tablet Commonly known as:  PEPCID Take 1 tablet (20 mg total) by mouth 2 (two) times daily for 14 days.   folic acid 1 MG tablet Commonly known as:  FOLVITE Take 1 tablet (1 mg total) by mouth daily.   gabapentin 100 MG capsule Commonly known as:  NEURONTIN Take 1 capsule (100 mg total) by mouth 3 (three) times daily.   metoCLOPramide 10 MG tablet Commonly known as:  REGLAN TAKE 1 TABLET BY MOUTH FOUR TIMES DAILY.   Pen Needles 32G X 4 MM Misc 1 Stick by Does not apply route daily.   pioglitazone 30 MG tablet Commonly known as:  ACTOS TAKE 1 TABLET BY MOUTH DAILY   rosuvastatin 20 MG tablet Commonly known as:  CRESTOR TAKE 1 TABLET BY MOUTH DAILY         Time coordinating discharge: 20 minutes.  SignedMargreta Journey Tiphanie Vo  Pager [phone] [facility] 02/20/2018, 12:47 PM

## 2018-05-15 ENCOUNTER — Encounter: Payer: Self-pay | Admitting: Family Medicine

## 2018-05-15 ENCOUNTER — Ambulatory Visit (INDEPENDENT_AMBULATORY_CARE_PROVIDER_SITE_OTHER): Payer: Medicare Other | Admitting: Family Medicine

## 2018-05-15 VITALS — BP 126/84 | HR 85 | Temp 97.8°F | Ht 69.5 in

## 2018-05-15 DIAGNOSIS — Z794 Long term (current) use of insulin: Secondary | ICD-10-CM

## 2018-05-15 DIAGNOSIS — Z7689 Persons encountering health services in other specified circumstances: Secondary | ICD-10-CM | POA: Diagnosis not present

## 2018-05-15 DIAGNOSIS — M79671 Pain in right foot: Secondary | ICD-10-CM | POA: Diagnosis not present

## 2018-05-15 DIAGNOSIS — M79672 Pain in left foot: Secondary | ICD-10-CM

## 2018-05-15 DIAGNOSIS — B351 Tinea unguium: Secondary | ICD-10-CM

## 2018-05-15 DIAGNOSIS — E1169 Type 2 diabetes mellitus with other specified complication: Secondary | ICD-10-CM

## 2018-05-15 DIAGNOSIS — E782 Mixed hyperlipidemia: Secondary | ICD-10-CM

## 2018-05-15 DIAGNOSIS — C7B8 Other secondary neuroendocrine tumors: Secondary | ICD-10-CM | POA: Diagnosis not present

## 2018-05-15 DIAGNOSIS — E1165 Type 2 diabetes mellitus with hyperglycemia: Secondary | ICD-10-CM | POA: Diagnosis not present

## 2018-05-15 LAB — CBC WITH DIFFERENTIAL/PLATELET
BASOS PCT: 0.6 % (ref 0.0–3.0)
Basophils Absolute: 0 10*3/uL (ref 0.0–0.1)
EOS PCT: 3.4 % (ref 0.0–5.0)
Eosinophils Absolute: 0.1 10*3/uL (ref 0.0–0.7)
HCT: 47.5 % (ref 39.0–52.0)
Hemoglobin: 16.5 g/dL (ref 13.0–17.0)
Lymphocytes Relative: 46 % (ref 12.0–46.0)
Lymphs Abs: 1.9 10*3/uL (ref 0.7–4.0)
MCHC: 34.8 g/dL (ref 30.0–36.0)
MCV: 97.1 fl (ref 78.0–100.0)
MONO ABS: 0.3 10*3/uL (ref 0.1–1.0)
Monocytes Relative: 6.6 % (ref 3.0–12.0)
NEUTROS PCT: 43.4 % (ref 43.0–77.0)
Neutro Abs: 1.8 10*3/uL (ref 1.4–7.7)
Platelets: 168 10*3/uL (ref 150.0–400.0)
RBC: 4.89 Mil/uL (ref 4.22–5.81)
RDW: 14 % (ref 11.5–15.5)
WBC: 4.2 10*3/uL (ref 4.0–10.5)

## 2018-05-15 LAB — MICROALBUMIN / CREATININE URINE RATIO
Creatinine,U: 65.2 mg/dL
Microalb Creat Ratio: 1.1 mg/g (ref 0.0–30.0)

## 2018-05-15 LAB — COMPREHENSIVE METABOLIC PANEL
ALK PHOS: 115 U/L (ref 39–117)
ALT: 21 U/L (ref 0–53)
AST: 17 U/L (ref 0–37)
Albumin: 3.9 g/dL (ref 3.5–5.2)
BILIRUBIN TOTAL: 0.8 mg/dL (ref 0.2–1.2)
BUN: 7 mg/dL (ref 6–23)
CO2: 29 mEq/L (ref 19–32)
Calcium: 9.1 mg/dL (ref 8.4–10.5)
Chloride: 100 mEq/L (ref 96–112)
Creatinine, Ser: 0.77 mg/dL (ref 0.40–1.50)
GFR: 113.69 mL/min (ref >60.00)
Glucose, Bld: 270 mg/dL — ABNORMAL HIGH (ref 70–99)
POTASSIUM: 3.8 meq/L (ref 3.5–5.1)
SODIUM: 134 meq/L — AB (ref 135–145)
Total Protein: 7.1 g/dL (ref 6.0–8.3)

## 2018-05-15 LAB — LIPID PANEL
CHOL/HDL RATIO: 7
Cholesterol: 193 mg/dL (ref 0–200)
HDL: 29.2 mg/dL — AB (ref >39.00)
LDL Cholesterol: 142 mg/dL — ABNORMAL HIGH (ref 0–99)
NONHDL: 163.9
Triglycerides: 112 mg/dL (ref 0.0–149.0)
VLDL: 22.4 mg/dL (ref 0.0–40.0)

## 2018-05-15 LAB — TSH: TSH: 3.22 u[IU]/mL (ref 0.35–4.50)

## 2018-05-15 MED ORDER — INSULIN DEGLUDEC 200 UNIT/ML ~~LOC~~ SOPN: 40.0000 [IU] | PEN_INJECTOR | Freq: Every evening | SUBCUTANEOUS | 5 refills | Status: DC
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
## 2018-05-15 NOTE — Patient Instructions (Addendum)
Good to see you today  Please schedule follow up for your diabetes in 1 month  Stop at front desk and schedule your medicare Annual Wellness visit and podiatry appointment     Insulin Injection Instructions, Mixing Two Insulins, Adult A subcutaneous injection is a shot of medicine that is injected into the layer of fat between skin and muscle. People with type 1 diabetes must take insulin because their bodies do not make it. People with type 2 diabetes may need to take insulin. There are many different types of insulin. The type of insulin that you take may determine how many injections you give yourself and when you need to take the injections. Choosing a site for injection Insulin absorption varies from site to site. It is best to inject insulin within the same body area, using a different spot in that area for each injection. Do not inject the insulin in the same spot for each injection. There are five main areas that can be used for injecting. These areas include:  Abdomen. This is the preferred area.  Front of thigh.  Upper, outer side of thigh.  Back of upper arm.  Buttocks.  Using a syringe and vial: mixing two insulins First, follow the steps for Getting Ready, then continue with the steps for Pushing Air Into Vials, then follow the steps for Combining Clear Insulin and Cloudy Insulin, and finish with the steps for Injecting the Insulin Mixture. Getting Ready 1. Wash your hands with soap and water. If soap and water are not available, use hand sanitizer. 2. Check the expiration dates and types of insulin. 3. Check the CLEAR insulin to see that it is clear and free of clumps. 4. Gently roll the bottle (vial) of CLOUDY insulin between your palms several times, or tip the vial up and down several times to mix up the medicine. Do not shake the vial. 5. Remove the plastic pop-top covering from the vial of CLOUDY insulin and the vial of CLEAR insulin, if this type of covering is  present. 6. Use an alcohol wipe to clean the rubber top of each vial. 7. Remove the plastic cover from the needle on the syringe. Do not let the needle touch anything. Pushing Air Into Vials  1. Pull the plunger back to bring (draw up) air into the syringe. The amount of air should be the same as the number of units you will be using of CLOUDY insulin. 2. While you keep the vial of CLOUDY insulin right-side-up, poke the needle through the rubber top of the vial. Do not turn the vial upside down to do this. 3. Push the plunger in all the way. This pushes air into the vial of CLOUDY insulin. 4. Remove the needle from the vial of CLOUDY insulin. 5. Pull the plunger back to draw up air into the syringe again. This amount of air should be the same as the number of units you will be using of CLEAR insulin. 6. While you keep the vial of CLEAR insulin right-side-up, poke the needle through the rubber top of the vial. Do not turn the vial upside down to do this. 7. Push the plunger in all the way. This pushes air into the vial of CLEAR insulin. 8. Do not take the needle out of the vial of CLEAR insulin yet. Combining Clear Insulin and Cloudy Insulin  1. While the needle is still in the vial of CLEAR insulin, turn that vial upside down and hold it at eye level.  2. Slowly pull back on the plunger to draw the desired number of CLEAR insulin units into the syringe. 3. If you see air bubbles in the syringe, slowly move the plunger up and down 2 or 3 times to get rid of them. 4. Remove the needle from the vial of CLEAR insulin. 5. Poke the needle from the partly filled syringe into the vial of CLOUDY insulin, turn the vial upside down, and hold the vial at eye level. Do not inject any of the CLEAR insulin (already in the syringe) into the vial of CLOUDY insulin. 6. Slowly pull back on the plunger until it gets to the number of units that is equal to the total number of units desired (CLEAR insulin units plus  CLOUDY insulin units). 7. Remove the needle from the vial of CLOUDY insulin. 8. Do not let the needle touch anything. Injecting the Insulin Mixture  1. Use an alcohol wipe to clean the site where you will be injecting the needle. Let the site air-dry. 2. Hold the syringe in your writing hand like a pencil. 3. Use your other hand to pinch and hold about an inch of skin. Do not directly touch the cleaned part of the skin. 4. Gently but quickly, put the needle straight into the skin. The needle should be at a 90-degree angle (perpendicular) to the skin, as if to form the letter "L." ? For example, if you are giving an injection in the abdomen, the abdomen forms one "leg" of the "L" and the needle forms the other "leg" of the "L." 5. For adults who have a small amount of body fat, the needle may need to be injected at a 45-degree angle instead. Your health care provider will tell you if this is necessary. ? A 45-degree angle looks like the letter "V." 6. Push the needle in as far as it will go (to the hub). 7. When the needle is completely inserted into the skin, use the thumb of your writing hand to push the plunger down all the way to inject the insulin. 8. Let go of the skin that you are pinching. Continue to hold the syringe in place with your writing hand. 9. Wait 5 seconds, then pull the needle straight out of the skin. 10. Press and hold the alcohol wipe over the injection site until any bleeding stops. Do not rub the area. 11. Do not put the plastic cover back on the needle. 12. Discard the syringe and needle directly into a sharps container, such as an empty plastic bottle with a cover. Throwing away supplies  Discard all used needles in a puncture-proof sharps disposal container. You can ask your local pharmacy about where you can get this kind of disposal container, or you can use an empty liquid laundry detergent bottle that has a cover.  Follow the disposal regulations for the area  where you live. Do not use any syringe or needle more than one time.  Throw away empty vials in the regular trash. What questions should I ask my health care provider?  How often should I be taking insulin?  How often should I check my blood glucose?  What amount of insulin should I be taking at each time?  What are the side effects?  What should I do if my blood glucose is too high?  What should I do if my blood glucose is too low?  What should I do if I forget to take my insulin?  What number  should I call if I have questions? Where can I get more information?  American Diabetes Association (ADA): www.diabetes.org  American Association of Diabetes Educators (AADE) Patient Resources: https://www.diabeteseducator.org/patient-resources This information is not intended to replace advice given to you by your health care provider. Make sure you discuss any questions you have with your health care provider. Document Released: 09/29/2015 Document Revised: 02/01/2016 Document Reviewed: 09/29/2015 Elsevier Interactive Patient Education  [employer].

## 2018-05-18 ENCOUNTER — Telehealth: Payer: Self-pay

## 2018-05-18 ENCOUNTER — Other Ambulatory Visit (INDEPENDENT_AMBULATORY_CARE_PROVIDER_SITE_OTHER): Payer: Medicare Other

## 2018-05-18 DIAGNOSIS — R7309 Other abnormal glucose: Secondary | ICD-10-CM | POA: Diagnosis not present

## 2018-05-18 LAB — HEMOGLOBIN A1C: HEMOGLOBIN A1C: 9 % — AB (ref 4.6–6.5)

## 2018-05-18 NOTE — Telephone Encounter (Signed)
He did not give me any forms when I saw him. Please call and see if he has the form?

## 2018-05-18 NOTE — Telephone Encounter (Signed)
Do you have these forms?

## 2018-05-18 NOTE — Telephone Encounter (Signed)
Copied from [place] [id number]. Topic: General - Other >> May 18, 2018  3:31 PM Valla Leaver wrote: Reason for CRM: Patient needs to f/u with Neoma Laming about forms for transportation to and from the office. Left vmail on [employer] on patient's behalf.

## 2018-05-19 NOTE — Telephone Encounter (Signed)
Called and left message for pt to return call to office.  

## 2018-05-20 NOTE — Telephone Encounter (Signed)
Called and spoke to patient. Patient states that he was informed by transportation that there was a for to be completed at the doctors office and sent somewhere for Transportation to and from appointment.  I asked a co worker if there is such at this office and she states she doesn't believe so.I informed patient to contact transportation and get see what for is needed and how it is obtained. Understanding verbalized nothing further needed at this tie

## 2018-05-21 MED ORDER — ROSUVASTATIN CALCIUM 20 MG PO TABS: 20.0000 mg | ORAL_TABLET | Freq: Every day | ORAL | 3 refills | Status: DC

## 2018-05-21 NOTE — Addendum Note (Signed)
Addended by: Clarene Reamer B on: 05/21/2018 08:12 AM   Modules accepted: Orders

## 2018-05-24 IMAGING — DX DG CHEST 2V
2 series · 2 of 2 positions shown · non-contrast
Comparison: 01/13/2014

CLINICAL DATA: Fall, weakness.

EXAM:
CHEST  2 VIEW

[chest lat]
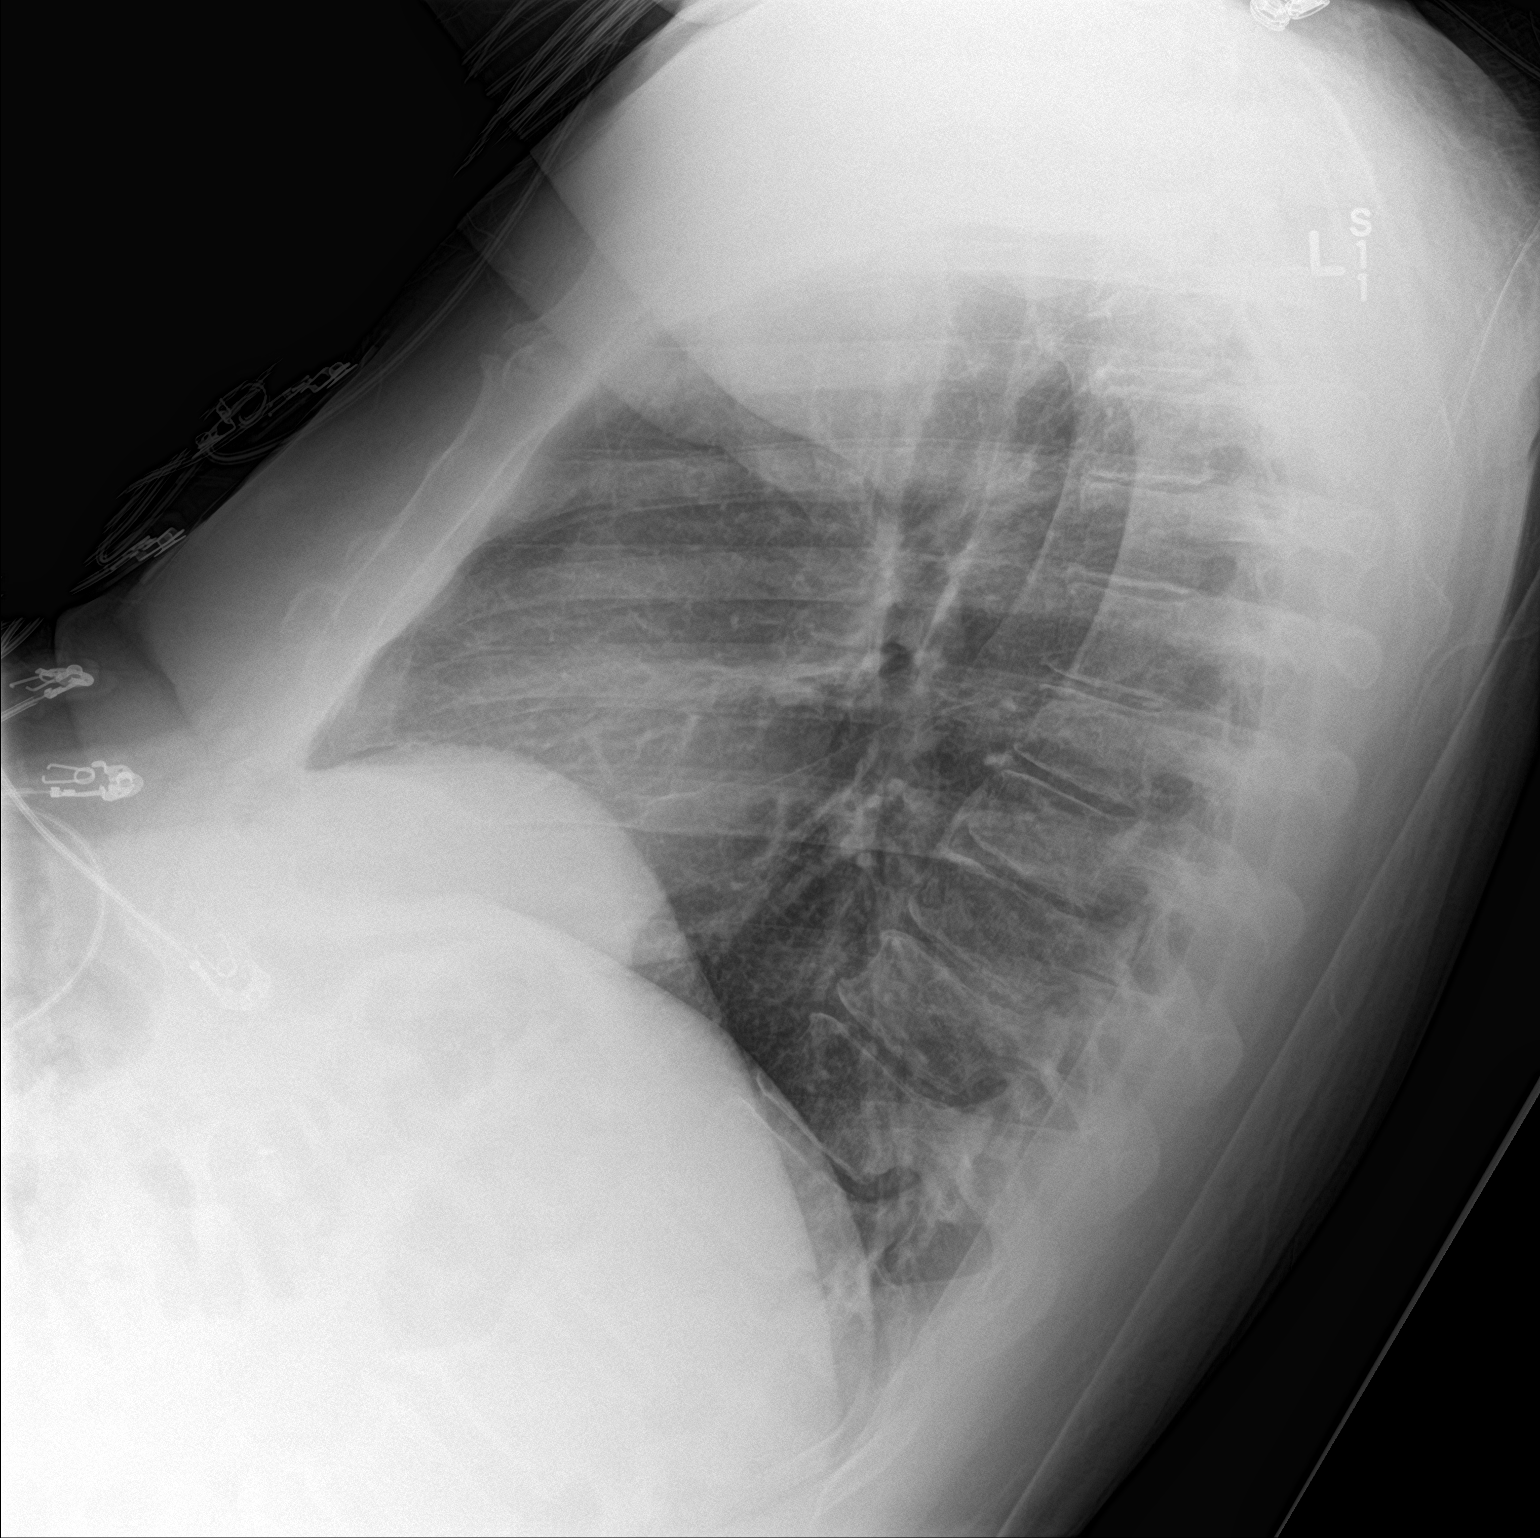

[chest ap]
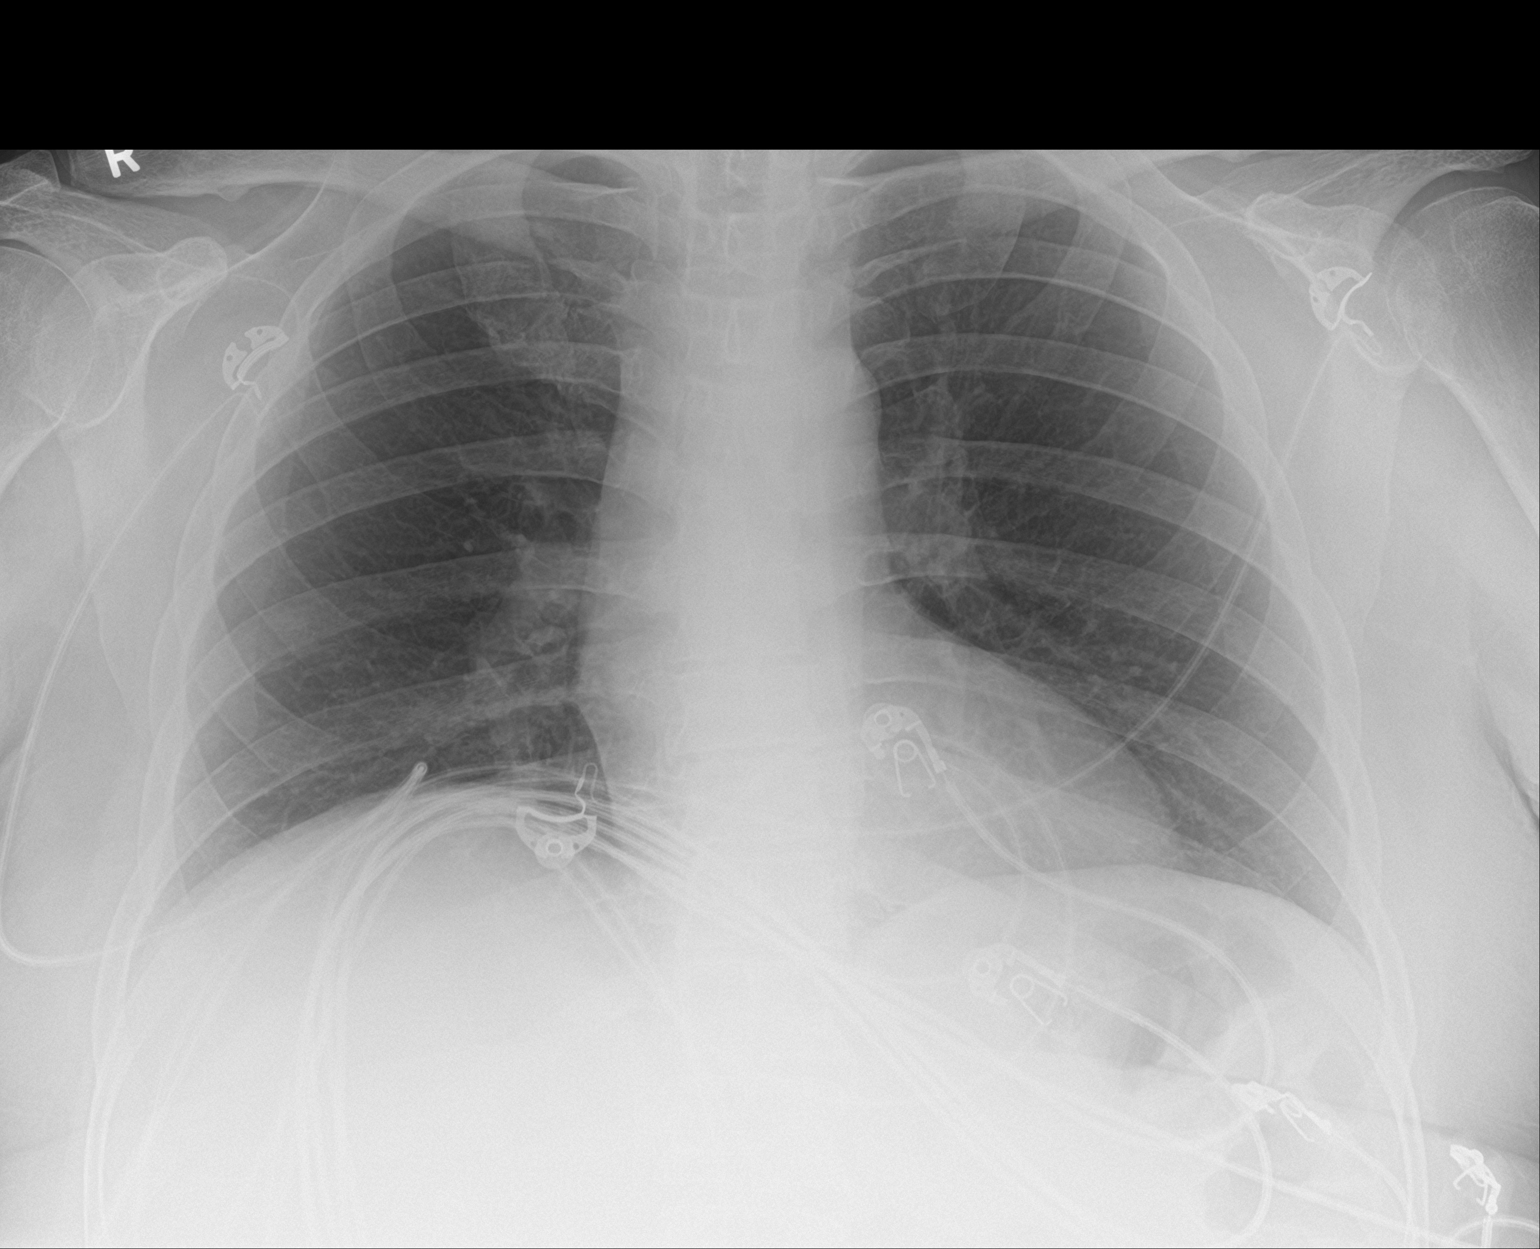

[2 of 2 positions shown; findings below may reference images not displayed]

FINDINGS: Heart and mediastinal contours are within normal limits. No focal
opacities or effusions. No acute bony abnormality.
IMPRESSION: No active cardiopulmonary disease.

## 2018-05-24 IMAGING — DX DG KNEE COMPLETE 4+V*L*
4 series · 4 of 4 positions shown · non-contrast
Comparison: None.

CLINICAL DATA: Fall onto knees today.  Anterior brace shins.

EXAM:
LEFT KNEE - COMPLETE 4+ VIEW

[knee ap]
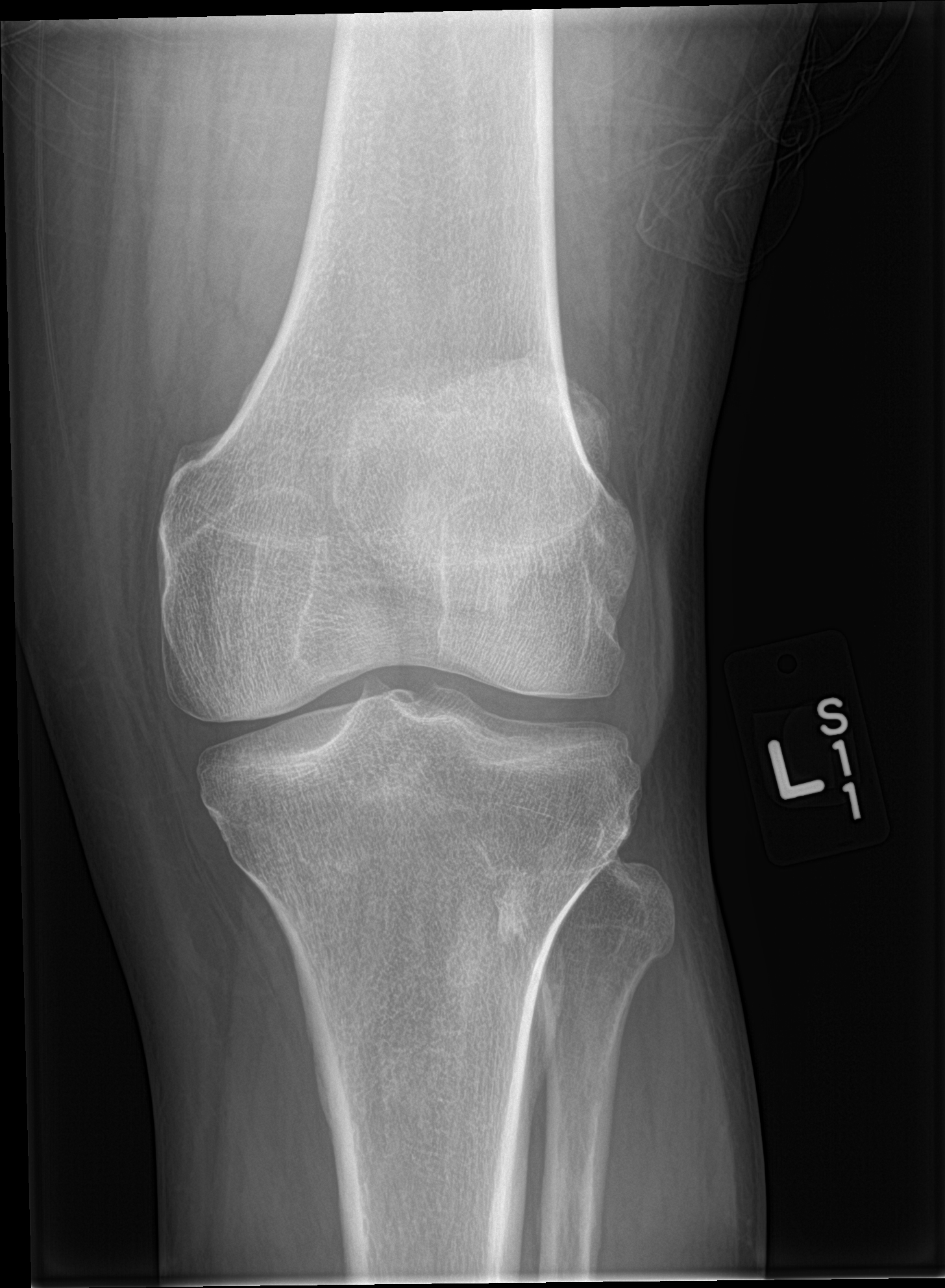

[knee lat]
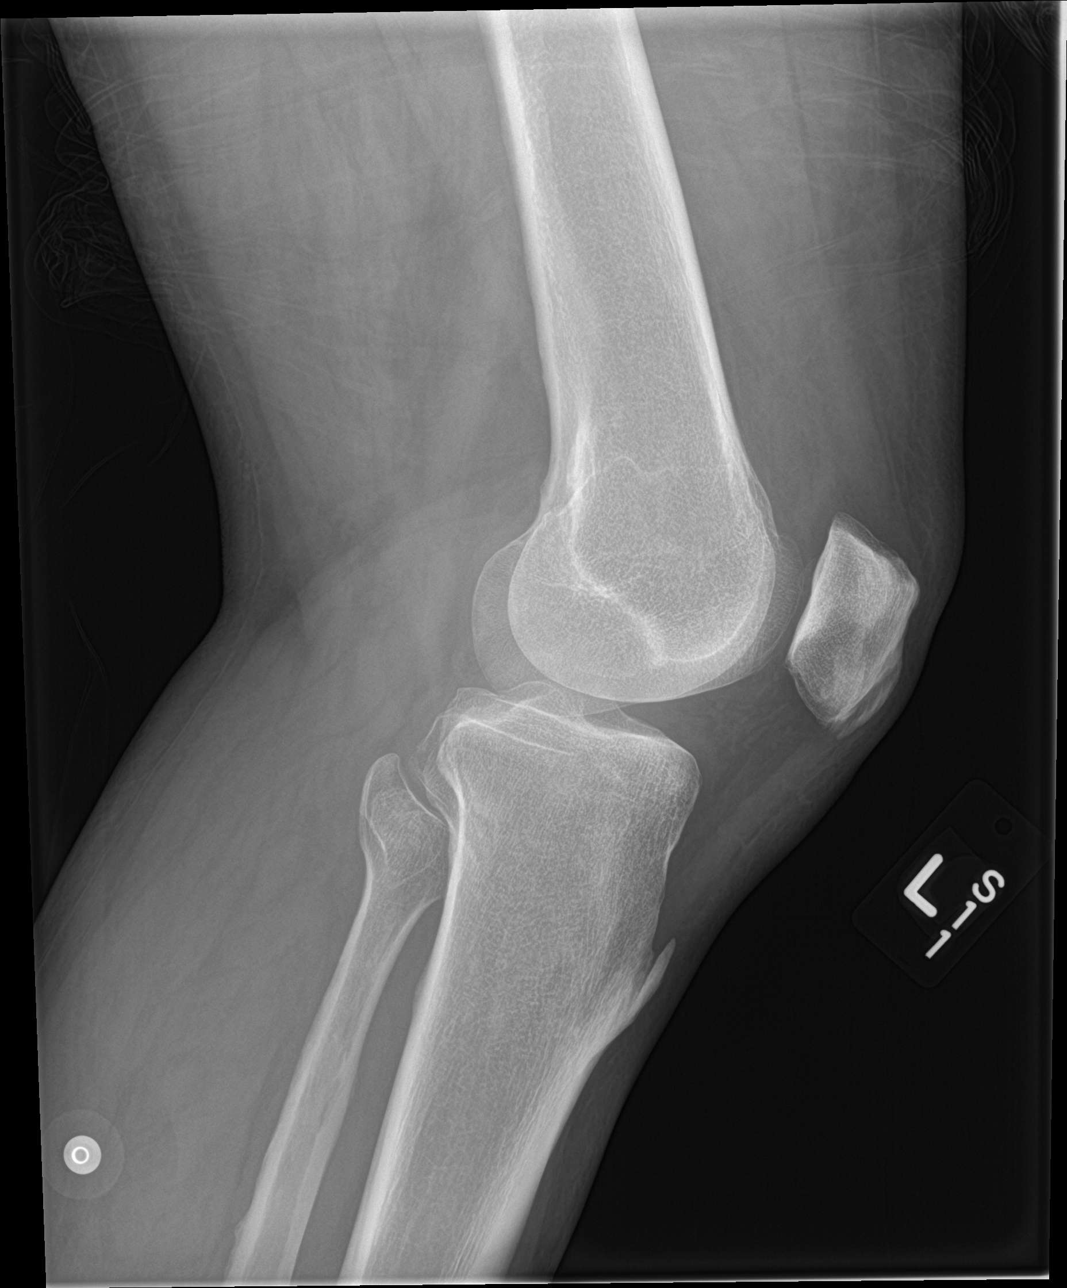

[knee obl (1 of 2)]
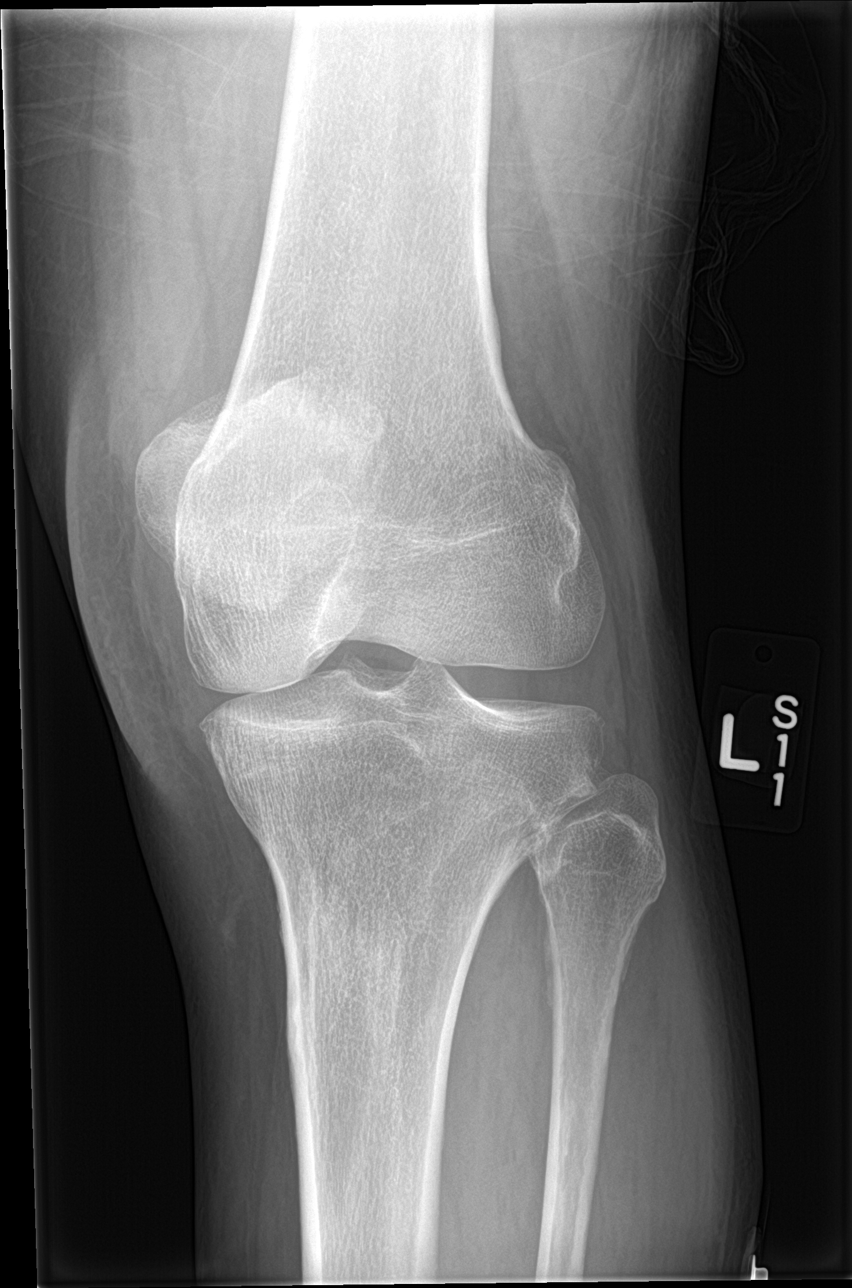

[knee obl (2 of 2)]
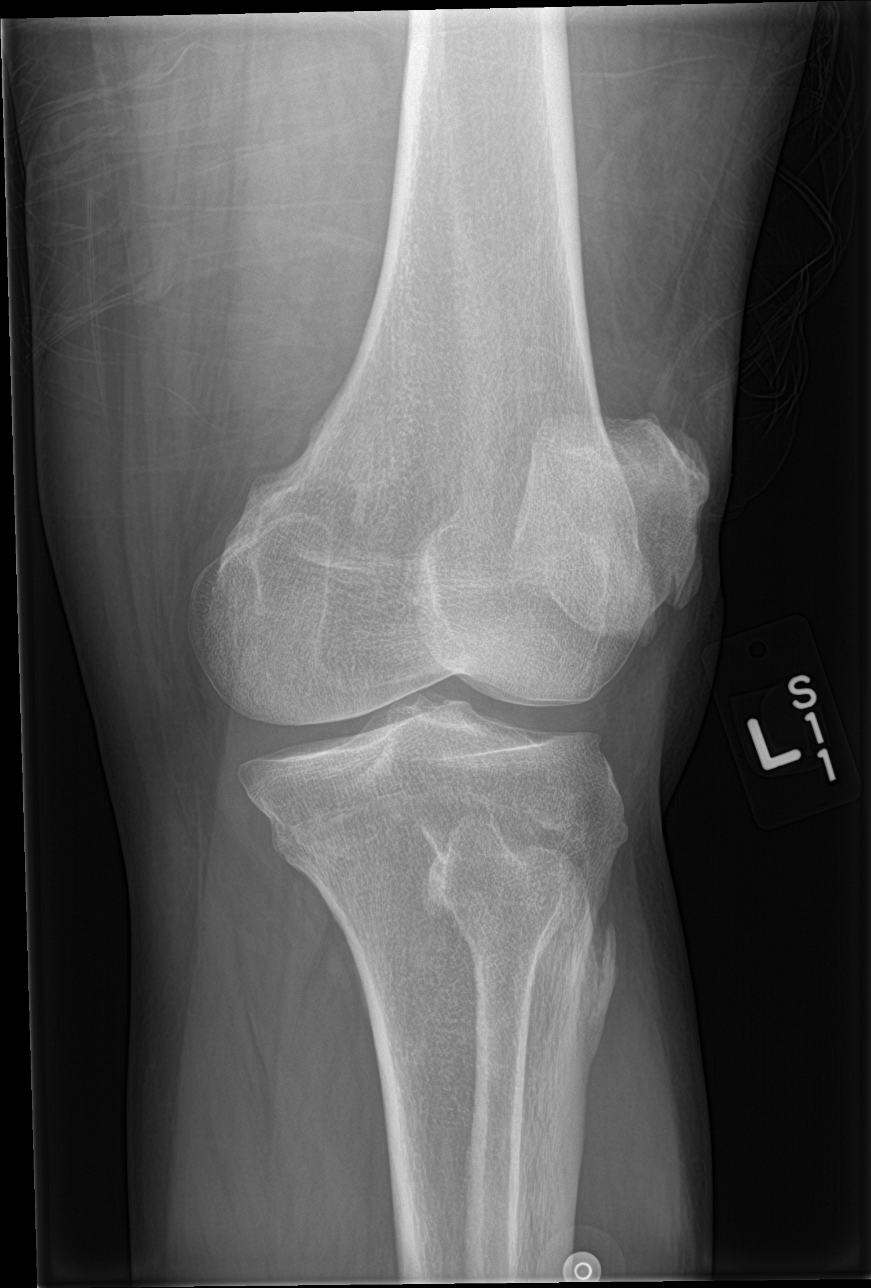

[4 of 4 positions shown; findings below may reference images not displayed]

FINDINGS: No acute bony abnormality. Specifically, no fracture, subluxation,
or dislocation. Soft tissues are intact. No joint effusion.
IMPRESSION: No acute bony abnormality.

## 2018-05-25 ENCOUNTER — Encounter: Payer: Self-pay | Admitting: Family Medicine

## 2018-05-25 ENCOUNTER — Ambulatory Visit: Payer: Medicare Other | Admitting: Podiatry

## 2018-05-25 IMAGING — DX DG ABDOMEN 2V
3 series · 3 of 3 positions shown · non-contrast
Comparison: None.

CLINICAL DATA: Abdominal distension. Pt's abdomen looked swollen on
the left side. Pt stated he's had pain in the LLQ and LRQ, can't
keep food down. Hx of colon cancer, GERD, Kidney stones, UTI (lower
urinary tract infection), ventral hernia.

EXAM:
ABDOMEN - 2 VIEW

[abdomen supine]
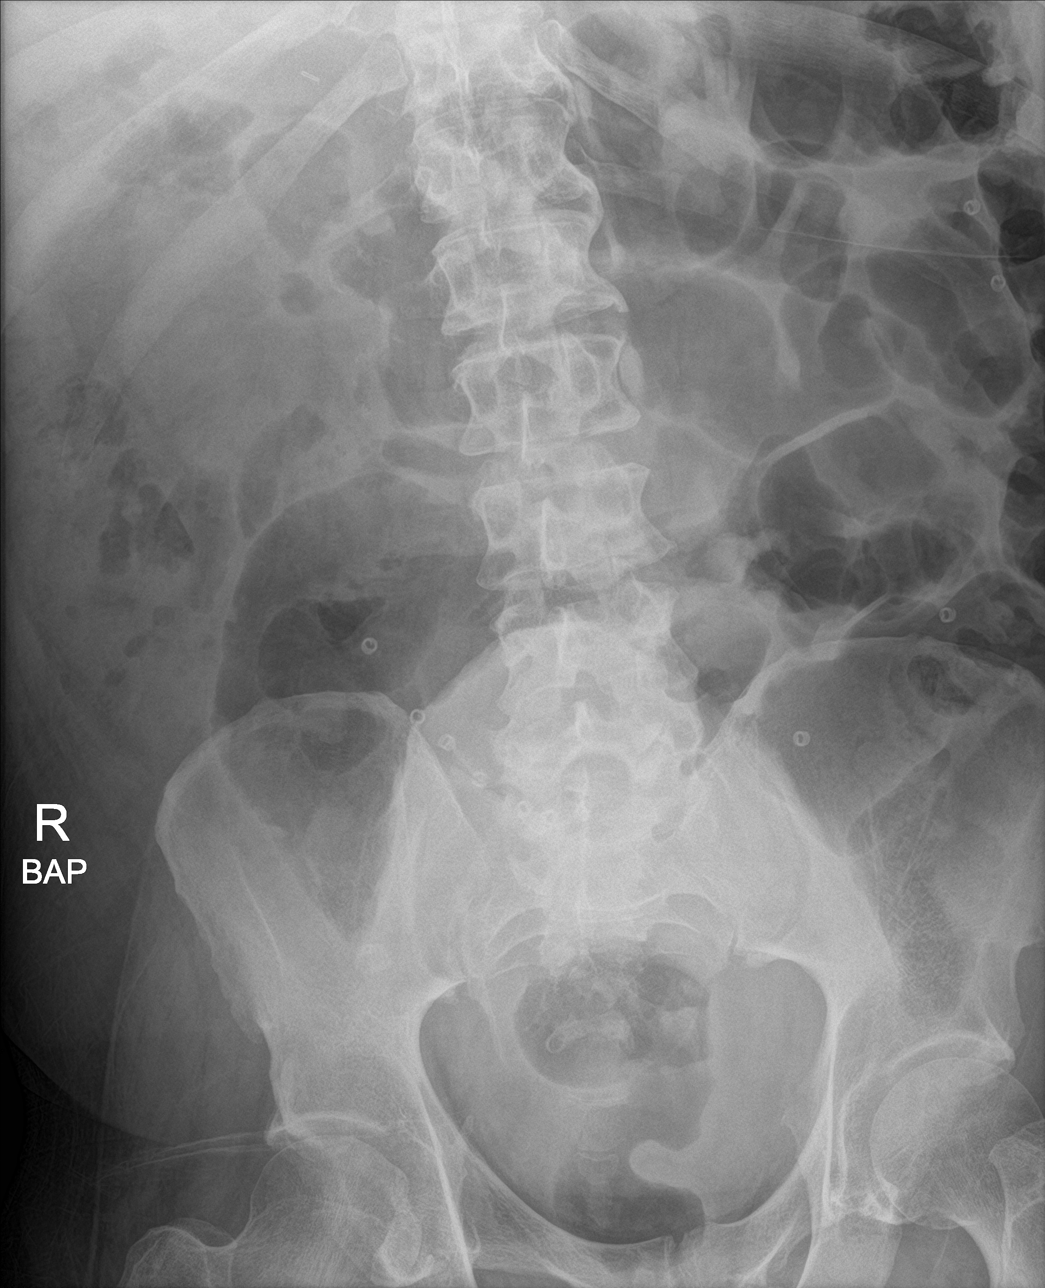

[abdomen decu (1 of 2)]
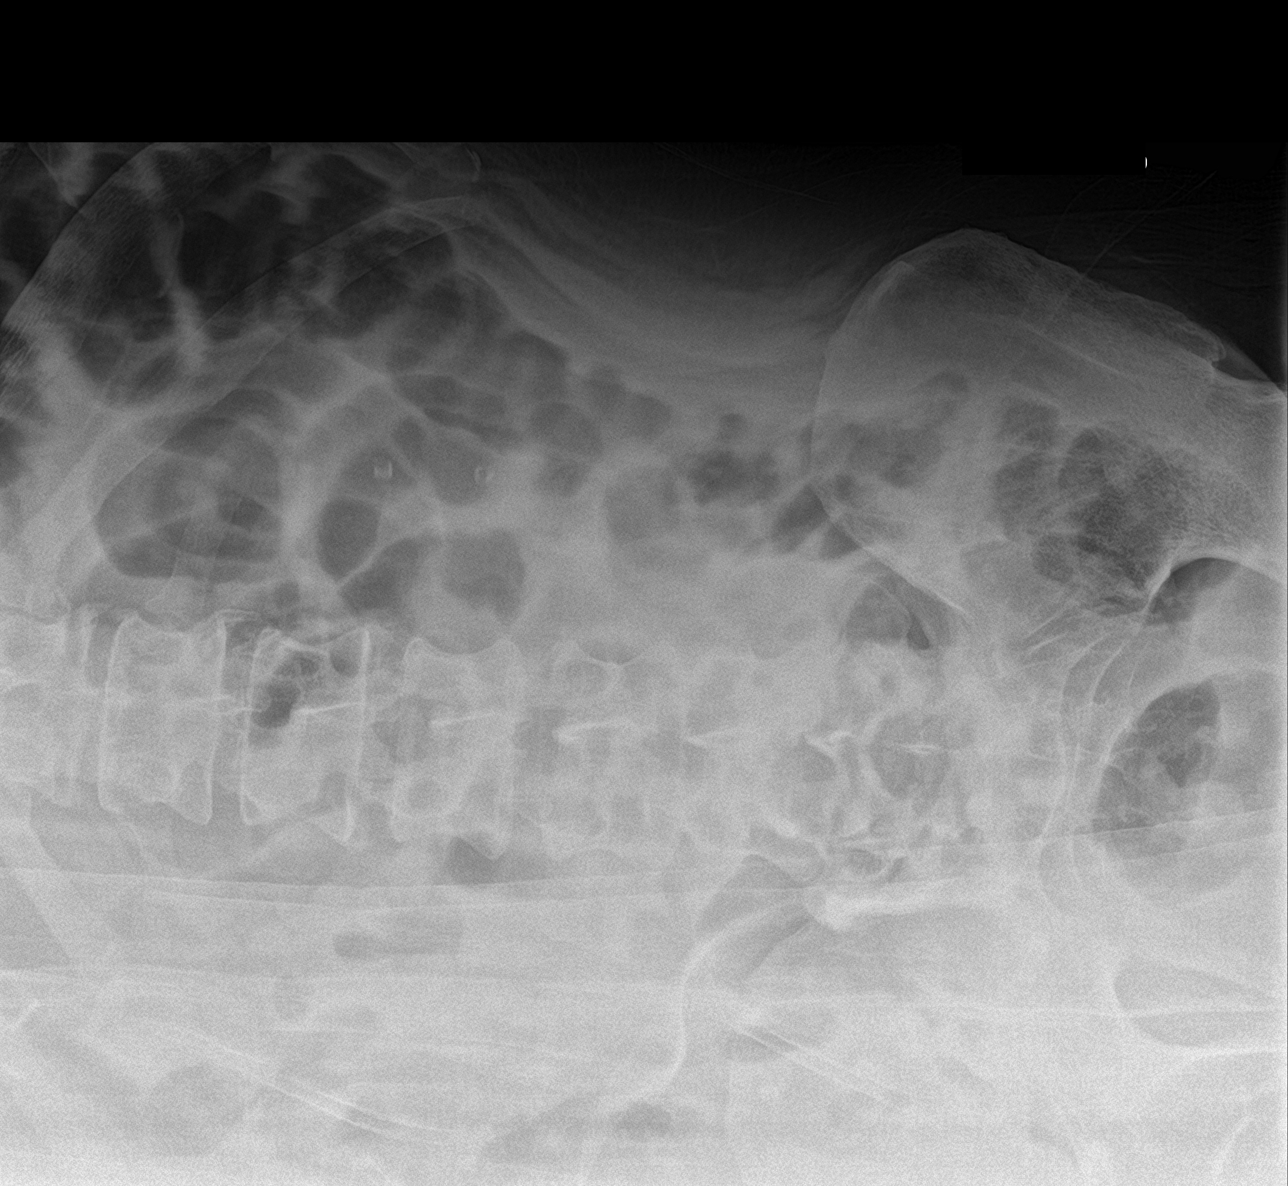

[abdomen decu (2 of 2)]
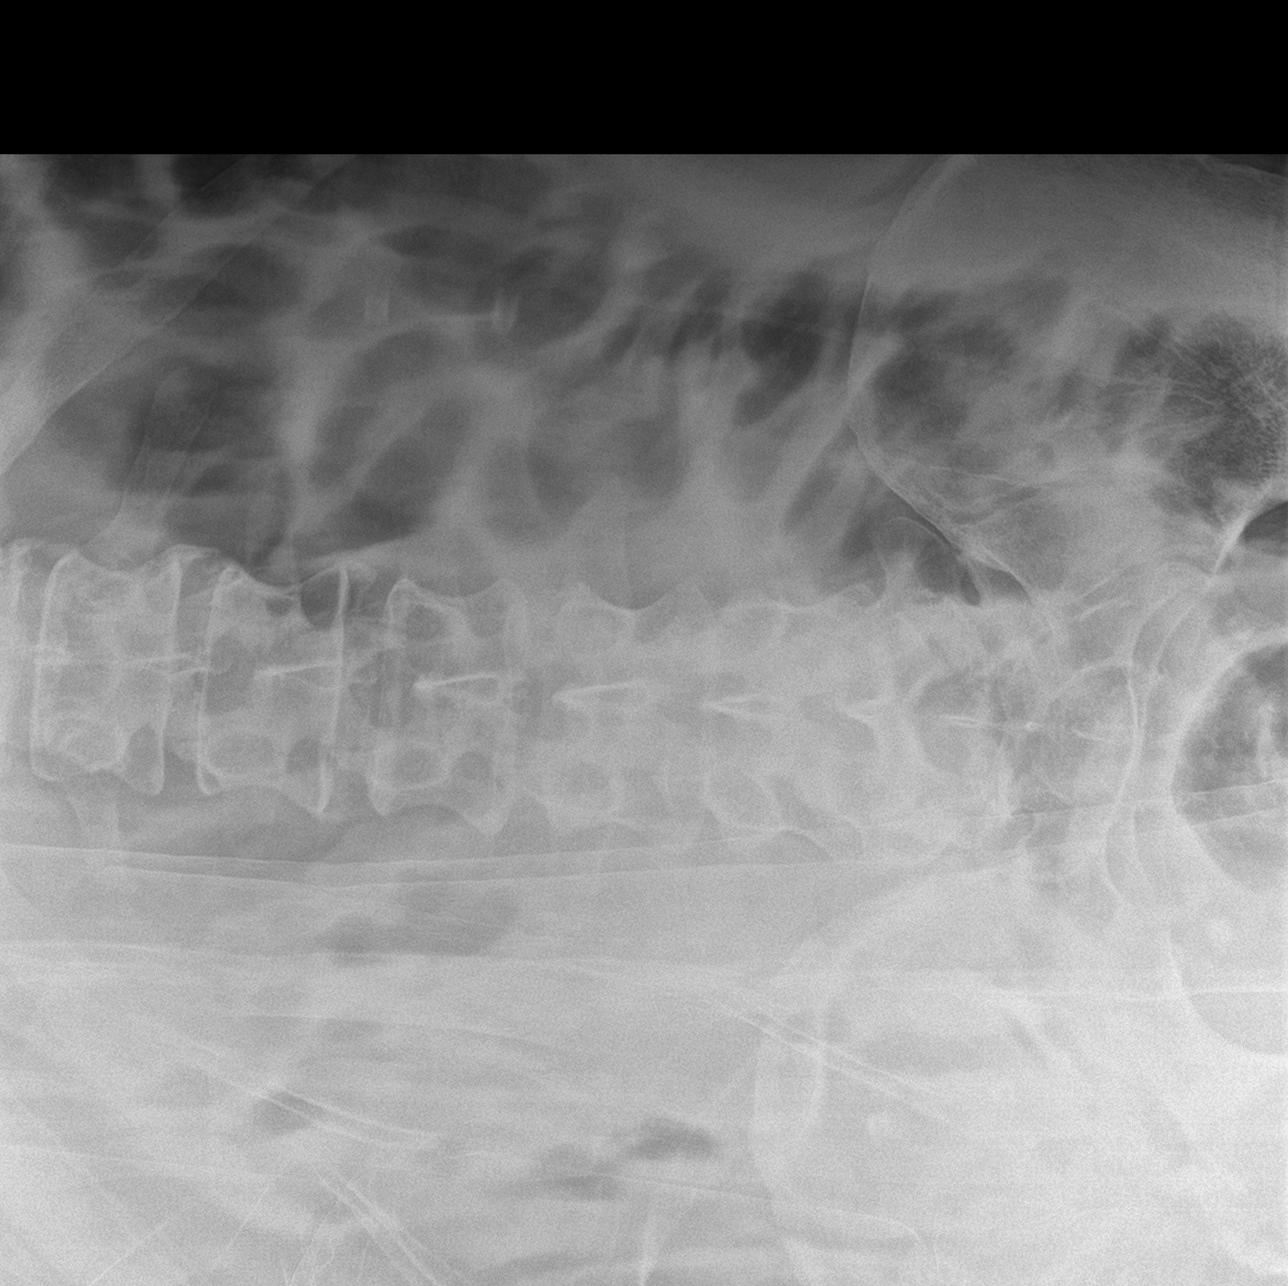

[3 of 3 positions shown; findings below may reference images not displayed]

FINDINGS: The bowel gas pattern is normal. There is no evidence of free air.
No radio-opaque calculi or other significant radiographic
abnormality is seen.
IMPRESSION: Negative.

## 2018-06-01 DIAGNOSIS — H5213 Myopia, bilateral: Secondary | ICD-10-CM | POA: Diagnosis not present

## 2018-06-01 DIAGNOSIS — E113293 Type 2 diabetes mellitus with mild nonproliferative diabetic retinopathy without macular edema, bilateral: Secondary | ICD-10-CM | POA: Diagnosis not present

## 2018-06-11 ENCOUNTER — Encounter: Payer: Self-pay | Admitting: Podiatry

## 2018-06-11 ENCOUNTER — Ambulatory Visit (INDEPENDENT_AMBULATORY_CARE_PROVIDER_SITE_OTHER): Payer: Medicare Other

## 2018-06-11 ENCOUNTER — Ambulatory Visit (INDEPENDENT_AMBULATORY_CARE_PROVIDER_SITE_OTHER): Payer: Medicare Other | Admitting: Podiatry

## 2018-06-11 ENCOUNTER — Other Ambulatory Visit: Payer: Self-pay | Admitting: Podiatry

## 2018-06-11 VITALS — BP 134/93 | HR 87 | Resp 16

## 2018-06-11 DIAGNOSIS — M79671 Pain in right foot: Secondary | ICD-10-CM | POA: Diagnosis not present

## 2018-06-11 DIAGNOSIS — M779 Enthesopathy, unspecified: Secondary | ICD-10-CM

## 2018-06-11 DIAGNOSIS — B351 Tinea unguium: Secondary | ICD-10-CM

## 2018-06-11 DIAGNOSIS — R600 Localized edema: Secondary | ICD-10-CM | POA: Diagnosis not present

## 2018-06-11 DIAGNOSIS — E1142 Type 2 diabetes mellitus with diabetic polyneuropathy: Secondary | ICD-10-CM | POA: Diagnosis not present

## 2018-06-11 DIAGNOSIS — M79672 Pain in left foot: Secondary | ICD-10-CM | POA: Diagnosis not present

## 2018-06-11 DIAGNOSIS — I872 Venous insufficiency (chronic) (peripheral): Secondary | ICD-10-CM | POA: Diagnosis not present

## 2018-06-11 NOTE — Progress Notes (Signed)
Subjective:  Patient ID: [name], male    DOB: 08-08-1966,  MRN: [id number]  Chief Complaint  Patient presents with  . Foot Pain    Bilateral; Dorsal - swelling; pt stated, "my feet are swelling and it is radiating up my leg; I saw Dr.Tom over a year ago and he told me he did not see any swelling in my feet"; x2+ months  . Debridement    Bilateral nail tirm; Pt Diabetic Type 2; Sugar=did not check today; A1C=9.0 (05/18/18)    52 y.o. male presents  for diabetic foot care. Last AMBS unknown. Reports numbness and tingling in their feet. Denies cramping in legs and thighs.  Review of Systems: Negative except as noted in the HPI. Denies N/V/F/Ch.  Past Medical History:  Diagnosis Date  . Cancer [facility]) carcinoid  . Colon cancer ([place])   . GERD (gastroesophageal reflux disease)   . Hyperlipemia   . Hypertension   . Kidney stones   . Shortness of breath    with exertion  . Type II diabetes mellitus ([place])   . UTI (lower urinary tract infection)   . Ventral hernia     Current Outpatient Medications:  .  Blood Glucose Monitoring Suppl (ONE TOUCH ULTRA 2) w/Device KIT, Check BS BID, Disp: 1 each, Rfl: 1 .  glucose blood (ONE TOUCH ULTRA TEST) test strip, Check BS bid, Disp: 200 each, Rfl: 3 .  Insulin Degludec (TRESIBA FLEXTOUCH) 200 UNIT/ML SOPN, Inject 40 Units into the skin every evening., Disp: 2 pen, Rfl: 5 .  Insulin Pen Needle (PEN NEEDLES) 32G X 4 MM MISC, 1 Stick by Does not apply route daily., Disp: 100 each, Rfl: 5 .  Lancet Devices (ONE TOUCH DELICA LANCING DEV) MISC, Check bs bid, Disp: 200 each, Rfl: 3 .  ONETOUCH DELICA LANCETS FINE MISC, Check bs bid, Disp: 200 each, Rfl: 3 .  rosuvastatin (CRESTOR) 20 MG tablet, Take 1 tablet (20 mg total) by mouth daily., Disp: 90 tablet, Rfl: 3  Social History   Tobacco Use  Smoking Status Never Smoker  Smokeless Tobacco Never Used    No Known Allergies Objective:   Vitals:   06/11/18 1010  BP: (!) 134/93  Pulse: 87    Resp: 16   There is no height or weight on file to calculate BMI. Constitutional Well developed. Well nourished.  Vascular Dorsalis pedis pulses present 1+ bilaterally  Posterior tibial pulses present 1+ bilaterally  Pedal hair growth absent. Capillary refill normal to all digits.  No cyanosis or clubbing noted. Slight localized edema to bilateral forefoot  Neurologic Normal speech. Oriented to person, place, and time. Epicritic sensation to light touch grossly present bilaterally. Protective sensation with 5.07 monofilament  absent bilaterally. Vibratory sensation absent bilaterally.  Dermatologic Nails elongated, thickened, dystrophic. No open wounds. No skin lesions.  Orthopedic: Normal joint ROM without pain or crepitus bilaterally. No visible deformities. No bony tenderness.   Assessment:   1. Localized edema   2. DM type 2 with diabetic peripheral neuropathy ([place])   3. Onychomycosis   4. Venous (peripheral) insufficiency    Plan:  Patient was evaluated and treated and all questions answered.  Diabetes with DPN, Onychomycosis -Educated on diabetic footcare. Diabetic risk level 1 -Nails x10 debrided sharply and manually with large nail nipper and rotary burr.   Procedure: Nail Debridement Rationale: Patient meets criteria for routine foot care due to DPN Type of Debridement: manual, sharp debridement. Instrumentation: Nail nipper, rotary burr. Number of Nails:  10   Venous insufficiency bilateral with localized edema -Discussed importance of compression socks.   -Tubigrip applied to bilateral lower extremities  Return in about 3 months (around 09/11/2018) for Diabetic Foot Care.

## 2018-06-17 ENCOUNTER — Ambulatory Visit (INDEPENDENT_AMBULATORY_CARE_PROVIDER_SITE_OTHER): Payer: Medicare Other | Admitting: Family Medicine

## 2018-06-17 ENCOUNTER — Encounter: Payer: Self-pay | Admitting: Family Medicine

## 2018-06-17 VITALS — BP 112/80 | HR 79 | Temp 98.2°F | Ht 69.5 in | Wt 243.0 lb

## 2018-06-17 DIAGNOSIS — E1165 Type 2 diabetes mellitus with hyperglycemia: Secondary | ICD-10-CM

## 2018-06-17 DIAGNOSIS — Z794 Long term (current) use of insulin: Secondary | ICD-10-CM

## 2018-06-17 DIAGNOSIS — C7B8 Other secondary neuroendocrine tumors: Secondary | ICD-10-CM | POA: Diagnosis not present

## 2018-06-17 DIAGNOSIS — M79605 Pain in left leg: Secondary | ICD-10-CM

## 2018-06-17 LAB — GLUCOSE, POCT (MANUAL RESULT ENTRY): POC GLUCOSE: 225 mg/dL — AB (ref 70–99)

## 2018-06-17 NOTE — Progress Notes (Signed)
Subjective:    Patient ID: [name], male    DOB: [birth year], 52 y.o.   MRN: [id number]  HPI This is a 52 yo male who presents today for follow up of uncontrolled DM type 2. Currently taking insulin degludec 40 units am/pm. Checking blood sugars after meals and bedtime. Usually twice a day. Last couple of days in 200s. Lives with his son and daughter in law. "I eat what the have." Drinks sweet tea, makes at home. Polydipsia, denies polyuria or polyphagia. Patient was on metformin in the past, was taken off because of swelling of his legs, he reports legs continue to swell off of metformin. He has a large bottle at home.   Saw podiatrist who diagnosed him with diabetic peripheral neuropathy, trimmed his toenails.   Has not been to oncology for follow up of malignant carcinoid tumor.   Left lateral lowerfleg pain, feels bruised, not sure how long. Does not recall injury.   Past Medical History:  Diagnosis Date  . Cancer [facility]) carcinoid  . Colon cancer ([place])   . GERD (gastroesophageal reflux disease)   . Hyperlipemia   . Hypertension   . Kidney stones   . Shortness of breath    with exertion  . Type II diabetes mellitus ([place])   . UTI (lower urinary tract infection)   . Ventral hernia    Past Surgical History:  Procedure Laterality Date  . ABDOMINAL HERNIA REPAIR  01/20/2014   recurrent VHR w/mesh  . CHOLECYSTECTOMY    . COLECTOMY  07/2008   "got all the cancer out"  . HERNIA REPAIR  02/2009; 01/20/2014   IHR; VHR  . INSERTION OF MESH N/A 01/20/2014   Procedure: INSERTION OF MESH;  Surgeon: [name]. [name], MD;  Location: [place];  Service: General;  Laterality: N/A;  . [place]   "had to open me up after they busted; cut just above pubis"  . VENTRAL HERNIA REPAIR N/A 01/20/2014   Procedure: OPEN REPAIR OF A RECURRENT VENTRAL HERNIA REPAIR;  Surgeon: [name]. [name], MD;  Location: [place] [facility];  Service: General;  Laterality: N/A;   Family History  Problem  Relation Age of Onset  . Cancer Mother 58       breast cancer  . Breast cancer Mother   . Diabetes Mother   . Emphysema Father   . Diabetes Sister    Social History   Tobacco Use  . Smoking status: Never Smoker  . Smokeless tobacco: Never Used  Substance Use Topics  . Alcohol use: No  . Drug use: No    Review of Systems Per HPI    Objective:   Physical Exam  Constitutional: He is oriented to person, place, and time. He appears well-developed and well-nourished. No distress.  HENT:  Head: Normocephalic and atraumatic.  Eyes: Conjunctivae are normal.  Cardiovascular: Normal rate.  Pulmonary/Chest: Effort normal.  Musculoskeletal:  Left mid lateral lower leg with area of skin discoloration, ? Resolving abrasion. Mildly tender to palpation. No edema. No calf pain or tenderness. No erythema.   Neurological: He is alert and oriented to person, place, and time.  Skin: He is not diaphoretic.  Vitals reviewed.     BP 112/80 (BP Location: Right Arm, Patient Position: Sitting, Cuff Size: Large)   Pulse 79   Temp 98.2 F (36.8 C) (Oral)   Ht 5' 9.5" (1.765 m)   Wt 243 lb (110.2 kg)   SpO2 98%   BMI  35.37 kg/m  Wt Readings from Last 3 Encounters:  06/17/18 243 lb (110.2 kg)  01/09/18 240 lb (108.9 kg)  12/31/17 252 lb 6.8 oz (114.5 kg)   Results for orders placed or performed in visit on 06/17/18  POCT glucose (manual entry)  Result Value Ref Range   POC Glucose 225 (A) 70 - 99 mg/dl       Assessment & Plan:  1. Uncontrolled type 2 diabetes mellitus with hyperglycemia, with long-term current use of insulin ([facility]) - will restart metformin- given instructions for restarting. Has bottle at home - not sure he is correct on insulin dose, he has an appointment next month, will have him bring all of his meds and glucometer - POCT glucose (manual entry)  2. Secondary neuroendocrine tumors ([place]) - provided contact information for him to schedule follow up with  oncology  3. Left leg pain - unclear etiology, no worrisome exam findings.  - continue to monitor for changes/worsening  - follow up as scheduled next month for CPE   Clarene Reamer, FNP-BC  [facility], [place]  06/17/2018 10:14 AM

## 2018-06-17 NOTE — Patient Instructions (Addendum)
Good to see you today  Please follow up in 2 months  Restart your metformin- I think you have the 1,000 mg tablets at home, take 1/2 tablet with dinner for a week then take 1/2 tablet with breakfast and dinner for 1 week, then increase to 1 tablet twice a day   Please call and schedule a follow up appointment with Dr. Lindi Adie at [phone]

## 2018-07-01 DIAGNOSIS — H524 Presbyopia: Secondary | ICD-10-CM | POA: Diagnosis not present

## 2018-07-17 ENCOUNTER — Ambulatory Visit: Payer: Medicare Other

## 2018-07-24 ENCOUNTER — Encounter: Payer: Medicare Other | Admitting: Family Medicine

## 2018-07-24 DIAGNOSIS — Z0289 Encounter for other administrative examinations: Secondary | ICD-10-CM

## 2018-08-17 ENCOUNTER — Ambulatory Visit: Payer: Medicare Other | Admitting: Family Medicine

## 2018-09-17 ENCOUNTER — Ambulatory Visit: Payer: Medicare Other | Admitting: Podiatry

## 2018-12-09 ENCOUNTER — Telehealth: Payer: Self-pay | Admitting: Family Medicine

## 2018-12-09 NOTE — Telephone Encounter (Signed)
I left msg for pt to call and schedule DM follow up. Please schedule webex with labs prior.

## 2020-03-29 ENCOUNTER — Emergency Department: Payer: Medicare Other

## 2020-03-29 ENCOUNTER — Other Ambulatory Visit: Payer: Self-pay

## 2020-03-29 ENCOUNTER — Inpatient Hospital Stay
Admission: EM | Admit: 2020-03-29 | Discharge: 2020-04-01 | DRG: 638 | Disposition: A | Discharge status: Home Health | Payer: Medicare Other | Attending: Internal Medicine | Admitting: Internal Medicine

## 2020-03-29 DIAGNOSIS — Z833 Family history of diabetes mellitus: Secondary | ICD-10-CM | POA: Diagnosis not present

## 2020-03-29 DIAGNOSIS — Z803 Family history of malignant neoplasm of breast: Secondary | ICD-10-CM

## 2020-03-29 DIAGNOSIS — E1165 Type 2 diabetes mellitus with hyperglycemia: Secondary | ICD-10-CM

## 2020-03-29 DIAGNOSIS — E876 Hypokalemia: Secondary | ICD-10-CM | POA: Diagnosis not present

## 2020-03-29 DIAGNOSIS — E1110 Type 2 diabetes mellitus with ketoacidosis without coma: Principal | ICD-10-CM | POA: Diagnosis present

## 2020-03-29 DIAGNOSIS — Z9049 Acquired absence of other specified parts of digestive tract: Secondary | ICD-10-CM | POA: Diagnosis not present

## 2020-03-29 DIAGNOSIS — N179 Acute kidney failure, unspecified: Secondary | ICD-10-CM | POA: Diagnosis present

## 2020-03-29 DIAGNOSIS — R339 Retention of urine, unspecified: Secondary | ICD-10-CM | POA: Diagnosis not present

## 2020-03-29 DIAGNOSIS — E785 Hyperlipidemia, unspecified: Secondary | ICD-10-CM | POA: Diagnosis present

## 2020-03-29 DIAGNOSIS — E860 Dehydration: Secondary | ICD-10-CM | POA: Diagnosis present

## 2020-03-29 DIAGNOSIS — Z9114 Patient's other noncompliance with medication regimen: Secondary | ICD-10-CM | POA: Diagnosis not present

## 2020-03-29 DIAGNOSIS — Z79899 Other long term (current) drug therapy: Secondary | ICD-10-CM | POA: Diagnosis not present

## 2020-03-29 DIAGNOSIS — Z85038 Personal history of other malignant neoplasm of large intestine: Secondary | ICD-10-CM

## 2020-03-29 DIAGNOSIS — K219 Gastro-esophageal reflux disease without esophagitis: Secondary | ICD-10-CM | POA: Diagnosis present

## 2020-03-29 DIAGNOSIS — Z825 Family history of asthma and other chronic lower respiratory diseases: Secondary | ICD-10-CM

## 2020-03-29 DIAGNOSIS — E1169 Type 2 diabetes mellitus with other specified complication: Secondary | ICD-10-CM | POA: Diagnosis present

## 2020-03-29 DIAGNOSIS — Z20822 Contact with and (suspected) exposure to covid-19: Secondary | ICD-10-CM | POA: Diagnosis present

## 2020-03-29 DIAGNOSIS — Z794 Long term (current) use of insulin: Secondary | ICD-10-CM

## 2020-03-29 DIAGNOSIS — N136 Pyonephrosis: Secondary | ICD-10-CM | POA: Diagnosis present

## 2020-03-29 DIAGNOSIS — I10 Essential (primary) hypertension: Secondary | ICD-10-CM | POA: Diagnosis present

## 2020-03-29 DIAGNOSIS — E782 Mixed hyperlipidemia: Secondary | ICD-10-CM

## 2020-03-29 LAB — SARS CORONAVIRUS 2 BY RT PCR (HOSPITAL ORDER, PERFORMED IN ~~LOC~~ HOSPITAL LAB): SARS Coronavirus 2: NEGATIVE

## 2020-03-29 LAB — MRSA PCR SCREENING: MRSA by PCR: NEGATIVE

## 2020-03-29 LAB — BLOOD GAS, VENOUS
Acid-base deficit: 8.4 mmol/L — ABNORMAL HIGH (ref 0.0–2.0)
Bicarbonate: 15.6 mmol/L — ABNORMAL LOW (ref 20.0–28.0)
O2 Saturation: 82.4 %
Patient temperature: 37
pCO2, Ven: 29 mmHg — ABNORMAL LOW (ref 44.0–60.0)
pH, Ven: 7.34 (ref 7.250–7.430)
pO2, Ven: 50 mmHg — ABNORMAL HIGH (ref 32.0–45.0)

## 2020-03-29 LAB — BASIC METABOLIC PANEL
Anion gap: 26 — ABNORMAL HIGH (ref 5–15)
Anion gap: 20 — ABNORMAL HIGH (ref 5–15)
BUN: 30 mg/dL — ABNORMAL HIGH (ref 6–20)
BUN: 27 mg/dL — ABNORMAL HIGH (ref 6–20)
CO2: 14 mmol/L — ABNORMAL LOW (ref 22–32)
CO2: 18 mmol/L — ABNORMAL LOW (ref 22–32)
Calcium: 8.7 mg/dL — ABNORMAL LOW (ref 8.9–10.3)
Calcium: 8.6 mg/dL — ABNORMAL LOW (ref 8.9–10.3)
Chloride: 97 mmol/L — ABNORMAL LOW (ref 98–111)
Chloride: 97 mmol/L — ABNORMAL LOW (ref 98–111)
Creatinine, Ser: 1.63 mg/dL — ABNORMAL HIGH (ref 0.61–1.24)
Creatinine, Ser: 1.36 mg/dL — ABNORMAL HIGH (ref 0.61–1.24)
GFR calc Af Amer: 56 mL/min — ABNORMAL LOW (ref >60)
GFR calc Af Amer: >60 mL/min (ref >60)
GFR calc non Af Amer: 48 mL/min — ABNORMAL LOW (ref >60)
GFR calc non Af Amer: 60 mL/min — ABNORMAL LOW (ref >60)
Glucose, Bld: 207 mg/dL — ABNORMAL HIGH (ref 70–99)
Glucose, Bld: 175 mg/dL — ABNORMAL HIGH (ref 70–99)
Potassium: 4 mmol/L (ref 3.5–5.1)
Potassium: 3.4 mmol/L — ABNORMAL LOW (ref 3.5–5.1)
Sodium: 137 mmol/L (ref 135–145)
Sodium: 135 mmol/L (ref 135–145)

## 2020-03-29 LAB — CBC
HCT: 53 % — ABNORMAL HIGH (ref 39.0–52.0)
Hemoglobin: 19.1 g/dL — ABNORMAL HIGH (ref 13.0–17.0)
MCH: 34 pg (ref 26.0–34.0)
MCHC: 36 g/dL (ref 30.0–36.0)
MCV: 94.5 fL (ref 80.0–100.0)
Platelets: 250 10*3/uL (ref 150–400)
RBC: 5.61 MIL/uL (ref 4.22–5.81)
RDW: 12.4 % (ref 11.5–15.5)
WBC: 15.6 10*3/uL — ABNORMAL HIGH (ref 4.0–10.5)
nRBC: 0 % (ref 0.0–0.2)

## 2020-03-29 LAB — URINALYSIS, COMPLETE (UACMP) WITH MICROSCOPIC
Bilirubin Urine: NEGATIVE
Glucose, UA: >=500 mg/dL — AB
Hgb urine dipstick: NEGATIVE
Ketones, ur: 80 mg/dL — AB
Leukocytes,Ua: NEGATIVE
Nitrite: NEGATIVE
Protein, ur: NEGATIVE mg/dL
Specific Gravity, Urine: 1.025 (ref 1.005–1.030)
pH: 5 (ref 5.0–8.0)

## 2020-03-29 LAB — GLUCOSE, CAPILLARY
Glucose-Capillary: 298 mg/dL — ABNORMAL HIGH (ref 70–99)
Glucose-Capillary: 282 mg/dL — ABNORMAL HIGH (ref 70–99)
Glucose-Capillary: 199 mg/dL — ABNORMAL HIGH (ref 70–99)
Glucose-Capillary: 164 mg/dL — ABNORMAL HIGH (ref 70–99)
Glucose-Capillary: 137 mg/dL — ABNORMAL HIGH (ref 70–99)
Glucose-Capillary: 155 mg/dL — ABNORMAL HIGH (ref 70–99)
Glucose-Capillary: 133 mg/dL — ABNORMAL HIGH (ref 70–99)
Glucose-Capillary: 161 mg/dL — ABNORMAL HIGH (ref 70–99)
Glucose-Capillary: 159 mg/dL — ABNORMAL HIGH (ref 70–99)
Glucose-Capillary: 150 mg/dL — ABNORMAL HIGH (ref 70–99)

## 2020-03-29 LAB — COMPREHENSIVE METABOLIC PANEL
ALT: 28 U/L (ref 0–44)
AST: 27 U/L (ref 15–41)
Albumin: 5 g/dL (ref 3.5–5.0)
Alkaline Phosphatase: 105 U/L (ref 38–126)
Anion gap: 33 — ABNORMAL HIGH (ref 5–15)
BUN: 29 mg/dL — ABNORMAL HIGH (ref 6–20)
CO2: 10 mmol/L — ABNORMAL LOW (ref 22–32)
Calcium: 9.1 mg/dL (ref 8.9–10.3)
Chloride: 91 mmol/L — ABNORMAL LOW (ref 98–111)
Creatinine, Ser: 1.97 mg/dL — ABNORMAL HIGH (ref 0.61–1.24)
GFR calc Af Amer: 44 mL/min — ABNORMAL LOW (ref >60)
GFR calc non Af Amer: 38 mL/min — ABNORMAL LOW (ref >60)
Glucose, Bld: 300 mg/dL — ABNORMAL HIGH (ref 70–99)
Potassium: 4.1 mmol/L (ref 3.5–5.1)
Sodium: 134 mmol/L — ABNORMAL LOW (ref 135–145)
Total Bilirubin: 2.4 mg/dL — ABNORMAL HIGH (ref 0.3–1.2)
Total Protein: 9.3 g/dL — ABNORMAL HIGH (ref 6.5–8.1)

## 2020-03-29 LAB — BETA-HYDROXYBUTYRIC ACID
Beta-Hydroxybutyric Acid: 6.65 mmol/L — ABNORMAL HIGH (ref 0.05–0.27)
Beta-Hydroxybutyric Acid: 5.46 mmol/L — ABNORMAL HIGH (ref 0.05–0.27)

## 2020-03-29 LAB — MAGNESIUM: Magnesium: 2.4 mg/dL (ref 1.7–2.4)

## 2020-03-29 LAB — TSH: TSH: 1.946 u[IU]/mL (ref 0.350–4.500)

## 2020-03-29 LAB — LACTIC ACID, PLASMA
Lactic Acid, Venous: 5.7 mmol/L (ref 0.5–1.9)
Lactic Acid, Venous: 5.1 mmol/L (ref 0.5–1.9)

## 2020-03-29 LAB — TROPONIN I (HIGH SENSITIVITY): Troponin I (High Sensitivity): 10 ng/L (ref <18)

## 2020-03-29 LAB — LIPASE, BLOOD: Lipase: 48 U/L (ref 11–51)

## 2020-03-29 MED ORDER — LACTATED RINGERS IV BOLUS
1000.0000 mL | Freq: Once | INTRAVENOUS | Status: AC
  Administered 2020-03-29: 1000 mL via INTRAVENOUS

## 2020-03-29 MED ORDER — INSULIN REGULAR(HUMAN) IN NACL 100-0.9 UT/100ML-% IV SOLN
INTRAVENOUS | Status: DC
  Administered 2020-03-29: 3.8 [IU]/h via INTRAVENOUS
  Administered 2020-03-29: 12 [IU]/h via INTRAVENOUS
  Administered 2020-03-29: 1.8 [IU]/h via INTRAVENOUS
  Filled 2020-03-29: qty 100

## 2020-03-29 MED ORDER — ONDANSETRON HCL 4 MG/2ML IJ SOLN
4.0000 mg | Freq: Once | INTRAMUSCULAR | Status: AC | PRN
  Administered 2020-03-29: 4 mg via INTRAVENOUS
  Filled 2020-03-29: qty 2

## 2020-03-29 MED ORDER — DEXTROSE-NACL 5-0.45 % IV SOLN: INTRAVENOUS | Status: DC

## 2020-03-29 MED ORDER — ONDANSETRON HCL 4 MG/2ML IJ SOLN
4.0000 mg | Freq: Four times a day (QID) | INTRAMUSCULAR | Status: DC | PRN
  Administered 2020-03-30: 4 mg via INTRAVENOUS
  Filled 2020-03-29: qty 2

## 2020-03-29 MED ORDER — ENOXAPARIN SODIUM 40 MG/0.4ML ~~LOC~~ SOLN
40.0000 mg | SUBCUTANEOUS | Status: DC
  Administered 2020-03-29 – 2020-03-31 (×3): 40 mg via SUBCUTANEOUS
  Filled 2020-03-29 (×3): qty 0.4

## 2020-03-29 MED ORDER — IOHEXOL 300 MG/ML  SOLN
75.0000 mL | Freq: Once | INTRAMUSCULAR | Status: AC | PRN
  Administered 2020-03-29: 75 mL via INTRAVENOUS

## 2020-03-29 MED ORDER — KCL IN DEXTROSE-NACL 20-5-0.9 MEQ/L-%-% IV SOLN: INTRAVENOUS | Status: DC

## 2020-03-29 MED ORDER — ACETAMINOPHEN 325 MG PO TABS: 650.0000 mg | ORAL_TABLET | Freq: Four times a day (QID) | ORAL | Status: DC | PRN

## 2020-03-29 MED ORDER — ONDANSETRON HCL 4 MG PO TABS: 4.0000 mg | ORAL_TABLET | Freq: Four times a day (QID) | ORAL | Status: DC | PRN

## 2020-03-29 MED ORDER — KCL IN DEXTROSE-NACL 20-5-0.45 MEQ/L-%-% IV SOLN
INTRAVENOUS | Status: DC
  Filled 2020-03-29 (×3): qty 1000

## 2020-03-29 MED ORDER — TAMSULOSIN HCL 0.4 MG PO CAPS
0.4000 mg | ORAL_CAPSULE | Freq: Every day | ORAL | Status: DC
  Administered 2020-03-30 – 2020-04-01 (×3): 0.4 mg via ORAL
  Filled 2020-03-29 (×3): qty 1

## 2020-03-29 MED ORDER — LABETALOL HCL 5 MG/ML IV SOLN: 10.0000 mg | INTRAVENOUS | Status: DC | PRN

## 2020-03-29 MED ORDER — ONDANSETRON HCL 4 MG/2ML IJ SOLN: 4.0000 mg | Freq: Four times a day (QID) | INTRAMUSCULAR | Status: DC

## 2020-03-29 MED ORDER — CHLORHEXIDINE GLUCONATE CLOTH 2 % EX PADS
6.0000 | MEDICATED_PAD | Freq: Every day | CUTANEOUS | Status: DC
  Administered 2020-03-29 – 2020-03-31 (×2): 6 via TOPICAL

## 2020-03-29 MED ORDER — SODIUM CHLORIDE 0.9 % IV SOLN: INTRAVENOUS | Status: DC

## 2020-03-29 MED ORDER — SENNOSIDES-DOCUSATE SODIUM 8.6-50 MG PO TABS: 1.0000 | ORAL_TABLET | Freq: Every evening | ORAL | Status: DC | PRN

## 2020-03-29 MED ORDER — DEXTROSE 50 % IV SOLN: 0.0000 mL | INTRAVENOUS | Status: DC | PRN

## 2020-03-29 MED ORDER — ACETAMINOPHEN 650 MG RE SUPP: 650.0000 mg | Freq: Four times a day (QID) | RECTAL | Status: DC | PRN

## 2020-03-29 MED ORDER — POTASSIUM CHLORIDE 10 MEQ/100ML IV SOLN
10.0000 meq | INTRAVENOUS | Status: AC
  Filled 2020-03-29: qty 100

## 2020-03-29 MED ORDER — PROMETHAZINE HCL 25 MG/ML IJ SOLN
25.0000 mg | Freq: Once | INTRAMUSCULAR | Status: AC
  Administered 2020-03-29: 25 mg via INTRAVENOUS
  Filled 2020-03-29: qty 1

## 2020-03-29 MED ORDER — SODIUM CHLORIDE 0.9 % IV BOLUS
1000.0000 mL | Freq: Once | INTRAVENOUS | Status: AC
  Administered 2020-03-29: 1000 mL via INTRAVENOUS

## 2020-03-29 MED ORDER — HYDROCODONE-ACETAMINOPHEN 5-325 MG PO TABS: 1.0000 | ORAL_TABLET | ORAL | Status: DC | PRN

## 2020-03-29 MED ORDER — SODIUM CHLORIDE 0.9 % IV SOLN
1.0000 g | Freq: Once | INTRAVENOUS | Status: AC
  Administered 2020-03-29: 1 g via INTRAVENOUS
  Filled 2020-03-29: qty 10

## 2020-03-29 NOTE — H&P (Signed)
History and Physical    ARIS EVEN NAT:[id number] DOB: [birth year] DOA: 03/29/2020  PCP: Patient, No Pcp Per  Patient coming from:   I have personally briefly reviewed patient's old medical records in [place]  Chief Complaint:   HPI: [name] is a 54 y.o. male with medical history significant of type 2 diabetes mellitus, hyperlipidemia, essential hypertension who presented to the ED with complaints of nausea and vomiting with diffuse abdominal pain.  Patient reports onset of symptoms roughly 2 days ago with also associated poor appetite.  Patient reports he has been out of his insulin for roughly 1 month and finally had it refilled yesterday.  Otherwise reports compliance with his home medications.  No recent sick contacts or changes in dietary habits.  Also reports decreased urine output over the past 1 day.  No other complaints or concerns at this time.  ED Course: Temperature 98.2, HR 140, RR 18, BP 96/67, SPO2 96% on room air.  WBC count 15.6, hemoglobin 19.1, platelets 250.  Sodium 137, potassium 4.0, chloride 97, CO2 14, BUN 30, creatinine 1.97.  Glucose 300.  Anion gap 33.  Beta hydroxybutyrate acid 6.65.  Lactic acid 5.7.  Troponin X.  Chest x-ray with no acute cardiopulmonary disease process.  CT abdomen/pelvis with markedly distended urinary bladder with moderate left hydronephrosis/hydroureter and mild right hydronephrosis.  Blood cultures x2: Pending.  Patient was started on insulin drip, Foley catheter placed for urinary retention and patient given ceftriaxone 1 g IV.  ED physician referred patient for admission for further evaluation and treatment for underlying DKA.  Review of Systems: As per HPI otherwise 10 point review of systems negative.    Past Medical History:  Diagnosis Date  . Cancer [facility]) carcinoid  . Colon cancer ([place])   . GERD (gastroesophageal reflux disease)   . Hyperlipemia   . Hypertension   . Kidney stones   . Shortness of breath    with  exertion  . Type II diabetes mellitus ([place])   . UTI (lower urinary tract infection)   . Ventral hernia     Past Surgical History:  Procedure Laterality Date  . ABDOMINAL HERNIA REPAIR  01/20/2014   recurrent VHR w/mesh  . CHOLECYSTECTOMY    . COLECTOMY  07/2008   "got all the cancer out"  . HERNIA REPAIR  02/2009; 01/20/2014   IHR; VHR  . INSERTION OF MESH N/A 01/20/2014   Procedure: INSERTION OF MESH;  Surgeon: [name]. [name], MD;  Location: [place];  Service: General;  Laterality: N/A;  . [age]   "had to open me up after they busted; cut just above pubis"  . VENTRAL HERNIA REPAIR N/A 01/20/2014   Procedure: OPEN REPAIR OF A RECURRENT VENTRAL HERNIA REPAIR;  Surgeon: [name]. [name], MD;  Location: [place];  Service: General;  Laterality: N/A;     reports that he has never smoked. He has never used smokeless tobacco. He reports that he does not drink alcohol and does not use drugs.  No Known Allergies  Family History  Problem Relation Age of Onset  . Cancer Mother 75       breast cancer  . Breast cancer Mother   . Diabetes Mother   . Emphysema Father   . Diabetes Sister     Family history reviewed and not pertinent   Prior to Admission medications   Medication Sig Start Date End Date Taking? Authorizing Provider  empagliflozin (JARDIANCE) 25  MG TABS tablet Take 25 mg by mouth daily.   Yes [provider]  hydrochlorothiazide (MICROZIDE) 12.5 MG capsule Take 12.5 mg by mouth daily.   Yes [provider]  Insulin Degludec (TRESIBA FLEXTOUCH) 200 UNIT/ML SOPN Inject 40 Units into the skin every evening. Patient taking differently: Inject 40 Units into the skin in the morning.  05/15/18  Yes Elby Beck, FNP  lisinopril-hydrochlorothiazide (ZESTORETIC) 10-12.5 MG tablet Take 1 tablet by mouth daily.   Yes [provider]  naproxen (NAPROSYN) 500 MG tablet Take 500 mg by mouth 2 (two) times daily with a meal.   Yes [provider]  potassium chloride (MICRO-K) 10 MEQ CR capsule Take 10 mEq by mouth 2 (two) times daily.   Yes [provider]  rosuvastatin (CRESTOR) 20 MG tablet Take 1 tablet (20 mg total) by mouth daily. 05/21/18  Yes Elby Beck, FNP    Physical Exam: Vitals:   03/29/20 1216  BP: 96/67  Pulse: (!) 140  Resp: 18  Temp: 98.2 F (36.8 C)  TempSrc: Oral  SpO2: 96%  Weight: 95.3 kg  Height: 5\' 11"  (1.803 m)    Constitutional: NAD, calm, comfortable, appears older than stated age Eyes: PERRL, lids and conjunctivae normal ENMT: Mucous membranes are dry. Posterior pharynx clear of any exudate or lesions.Normal dentition.  Neck: normal, supple, no masses, no thyromegaly Respiratory: clear to auscultation bilaterally, no wheezing, no crackles. Normal respiratory effort. No accessory muscle use.  Oxygenating well on room air Cardiovascular: Regular rate and rhythm, no murmurs / rubs / gallops. No extremity edema. 2+ pedal pulses. No carotid bruits.  Abdomen: no tenderness, no masses palpated. No hepatosplenomegaly. Bowel sounds positive.  GU: Foley catheter noted in place with roughly 1500 mL clear yellow urine Musculoskeletal: no clubbing / cyanosis. No joint deformity upper and lower extremities. Good ROM, no contractures. Normal muscle tone.  Skin: no rashes, lesions, ulcers. No induration Neurologic: CN 2-12 grossly intact. Sensation intact, DTR normal. Strength 5/5 in all 4.  Psychiatric: Normal judgment and insight. Alert and oriented x 3. Normal mood.    Labs on Admission: I have personally reviewed following labs and imaging studies  CBC: Recent Labs  Lab 03/29/20 1231  WBC 15.6*  HGB 19.1*  HCT 53.0*  MCV 94.5  PLT 433   Basic Metabolic Panel: Recent Labs  Lab 03/29/20 1231 03/29/20 1544  NA 134* 137  K 4.1 4.0  CL 91* 97*  CO2 10* 14*  GLUCOSE 300* 207*  BUN 29* 30*  CREATININE 1.97* 1.63*  CALCIUM 9.1 8.7*  MG 2.4  --    GFR: Estimated  Creatinine Clearance: 63.2 mL/min (A) (by C-G formula based on SCr of 1.63 mg/dL (H)). Liver Function Tests: Recent Labs  Lab 03/29/20 1231  AST 27  ALT 28  ALKPHOS 105  BILITOT 2.4*  PROT 9.3*  ALBUMIN 5.0   Recent Labs  Lab 03/29/20 1231  LIPASE 48   No results for input(s): AMMONIA in the last 168 hours. Coagulation Profile: No results for input(s): INR, PROTIME in the last 168 hours. Cardiac Enzymes: No results for input(s): CKTOTAL, CKMB, CKMBINDEX, TROPONINI in the last 168 hours. BNP (last 3 results) No results for input(s): PROBNP in the last 8760 hours. HbA1C: No results for input(s): HGBA1C in the last 72 hours. CBG: Recent Labs  Lab 03/29/20 1410 03/29/20 1444 03/29/20 1553 03/29/20 1650  GLUCAP 298* 282* 199* 164*   Lipid Profile: No results for input(s):  CHOL, HDL, LDLCALC, TRIG, CHOLHDL, LDLDIRECT in the last 72 hours. Thyroid Function Tests: No results for input(s): TSH, T4TOTAL, FREET4, T3FREE, THYROIDAB in the last 72 hours. Anemia Panel: No results for input(s): VITAMINB12, FOLATE, FERRITIN, TIBC, IRON, RETICCTPCT in the last 72 hours. Urine analysis:    Component Value Date/Time   COLORURINE YELLOW 12/30/2017 0442   APPEARANCEUR CLEAR 12/30/2017 0442   LABSPEC 1.022 12/30/2017 0442   PHURINE 5.0 12/30/2017 0442   GLUCOSEU >=500 (A) 12/30/2017 0442   HGBUR NEGATIVE 12/30/2017 0442   HGBUR negative 09/07/2009 0822   BILIRUBINUR NEGATIVE 12/30/2017 0442   KETONESUR 80 (A) 12/30/2017 0442   PROTEINUR NEGATIVE 12/30/2017 0442   UROBILINOGEN 0.2 09/07/2009 0822   NITRITE NEGATIVE 12/30/2017 0442   LEUKOCYTESUR NEGATIVE 12/30/2017 0442    Radiological Exams on Admission: CT Abdomen Pelvis W Contrast  Result Date: 03/29/2020 CLINICAL DATA:  Abdominal pain, acute and not localized. Nausea, vomiting, dizziness for 2 days. History of diabetes and colon cancer. EXAM: CT ABDOMEN AND PELVIS WITH CONTRAST TECHNIQUE: Multidetector CT imaging of the  abdomen and pelvis was performed using the standard protocol following bolus administration of intravenous contrast. CONTRAST:  36mL OMNIPAQUE IOHEXOL 300 MG/ML  SOLN COMPARISON:  11/04/2017 FINDINGS: Lower chest: Lung bases are unremarkable. Heart size is normal. There is diffuse thickening of the wall of the LOWER esophagus, increased compared to prior study but raising the question of esophagitis. Hepatobiliary: Status post cholecystectomy.  Liver is unremarkable. Pancreas: Unremarkable. No pancreatic ductal dilatation or surrounding inflammatory changes. Spleen: Normal in size without focal abnormality. Adrenals/Urinary Tract: Adrenal glands are normal. There is moderate LEFT hydronephrosis with tortuous dilated LEFT ureter to the level of the urinary bladder. No obstructing stone identified. Simple cyst in the LOWER pole the LEFT kidney is 3.0 centimeters and stable. There is mild RIGHT hydronephrosis. RIGHT ureter is unremarkable. The urinary bladder is marker markedly distended. No bladder stones identified. Visualized portion of the urethra is unremarkable. Stomach/Bowel: Stomach is unremarkable. Small bowel loops are normal in caliber and wall thickness. Status post RIGHT hemicolectomy. The anastomosis is unremarkable. Distal colon is normal in appearance. Redundant sigmoid colon noted. Vascular/Lymphatic: There is minimal atherosclerotic calcification of the abdominal aorta. No associated aneurysm. No lymphadenopathy. Reproductive: Few calcifications within the prostate gland. Other: No ascites. Anterior abdominal wall surgical changes. No hernia. Musculoskeletal: Degenerative changes are seen in the lumbar spine. IMPRESSION: 1. Markedly distended urinary bladder. 2. Moderate LEFT hydronephrosis and hydroureter to the level of the urinary bladder. No obstructing stone identified. Considerations include bladder outlet obstruction and possible LEFT ureteral stricture. 3. Mild RIGHT hydronephrosis without  obstructing stone. 4. Thickened distal esophageal wall, raising the question of esophagitis. 5. Status post RIGHT hemicolectomy.  Unremarkable anastomosis. 6. Status post cholecystectomy. 7. Aortic Atherosclerosis (ICD10-I70.0). Electronically Signed   By: Nolon Nations [name].   On: 03/29/2020 15:46   DG Chest Portable 1 View  Result Date: 03/29/2020 CLINICAL DATA:  Chest pain nausea vomiting dizziness EXAM: PORTABLE CHEST 1 VIEW COMPARISON:  12/29/2017 FINDINGS: The heart size and mediastinal contours are within normal limits. Both lungs are clear. The visualized skeletal structures are unremarkable. IMPRESSION: No active disease. Electronically Signed   By: Donavan Foil [name].   On: 03/29/2020 15:09    EKG: Independently reviewed.   Assessment/Plan Principal Problem:   DKA (diabetic ketoacidoses) (HCC) Active Problems:   Type 2 diabetes mellitus with hyperlipidemia ([place])   Essential hypertension   AKI (acute kidney injury) ([place])    DKA  Hx T2DM Patient presenting with 2-day history of nausea/vomiting with diffuse abdominal pain, poor appetite and decreased urine output.  Complicated by being out of his home insulin over the past month.  Patient was noted to have an elevated glucose of 300 with a lactic acid of 5.7, anion gap of 33.  Beta hydroxybutyrate acid elevated 6.65. --Admit to inpatient, stepdown unit --DKA protocol with IV insulin --NS at 75 mL's per hour, transition to D5 half NS at 75 mL's per hour when CBG less than 250. --BMP every 4 hours --CBGs every hour --Continue to trend lactic acid and beta hydroxybutyrate acid --Diabetic educator consult  Acute renal failure Nausea likely multifactorial with acute urinary retention and significant dehydration as patient in DKA.  CT abdomen/pelvis shows markedly distended urinary bladder with bilateral hydronephrosis.  Creatinine 1.97 on admission, baseline 0.77 and 2019 per review of EMR. --Cr 1.97-->1.63 --Continue treatment with  IV insulin as above and IV fluids --Hold home HCTZ/lisinopril --Avoid nephrotoxins, renally dose all medications --Follow renal function closely daily  Acute urinary retention Bladder scan ED shows greater than 1000 mL of retained urine.  Foley catheter placed. --Start tamsulosin 0.4 mg p.o. daily --Continue Foley catheter for now, once blood sugar better controlled will attempt voiding trial --Her strict I's and O's; especially urinary output  Essential hypertension Patient on lisinopril 10 mg p.o. daily and HCTZ 25 mg p.o. daily.  BP 96/67. --We will hold home antihypertensives --Labetalol 10 g IV every 4 hours as needed SBP greater than 170  Hyperlipidemia --hold home Crestor 20 mg p.o. daily for now   DVT prophylaxis: Lovenox Code Status: Full code Family Communication: Attempted to contact patient's daughter on 2 occasions via telephone unsuccessful Disposition Plan: Inpatient, anticipate discharge home once DKA resolved Consults called: None Admission status: Inpatient, stepdown unit   Severity of Illness: The appropriate patient status for this patient is INPATIENT. Inpatient status is judged to be reasonable and necessary in order to provide the required intensity of service to ensure the patient's safety. The patient's presenting symptoms, physical exam findings, and initial radiographic and laboratory data in the context of their chronic comorbidities is felt to place them at high risk for further clinical deterioration. Furthermore, it is not anticipated that the patient will be medically stable for discharge from the hospital within 2 midnights of admission. The following factors support the patient status of inpatient.   " The patient's presenting symptoms include weakness, fatigue, abdominal pain, nausea/vomiting " The worrisome physical exam findings include dry mucous membranes, tenderness on abdominal palpation " The initial radiographic and laboratory data are  worrisome because of anion gap metabolic acidosis, elevated WBC count, elevated hydroxybutyrate acid, glucose of 300, CT abdomen/pelvis with distended urinary bladder with hydronephrosis bilaterally " The chronic co-morbidities include type 2 diabetes mellitus, hypertension, dyslipidemia   * I certify that at the point of admission it is my clinical judgment that the patient will require inpatient hospital care spanning beyond 2 midnights from the point of admission due to high intensity of service, high risk for further deterioration and high frequency of surveillance required.*    Sunaina Ferrando J [place] DO [facility] Available via Epic secure chat 7am-7pm After these hours, please refer to coverage provider listed on [email or web address] 03/29/2020, 5:37 PM

## 2020-03-29 NOTE — ED Triage Notes (Signed)
Pt arrives EMS for vomiting since yesterday. Denies diarrhea. Also c/o of swelling to legs, state "they're tight". Tachy in triage.

## 2020-03-29 NOTE — ED Triage Notes (Signed)
Pt comes into the ED via EMS from home with c/o N/V, dizziness, CP for the past couple of days,.  with hx of DM and colon CA. [id number]

## 2020-03-29 NOTE — ED Provider Notes (Signed)
[facility] Emergency Department Provider Note   ____________________________________________   None    (approximate)  I have reviewed the triage vital signs and the nursing notes.   HISTORY  Chief Complaint Emesis    HPI [name] is a 54 y.o. male with past medical history of hypertension, diabetes, colon cancer status post colectomy, and ventral hernia status post repair who presents to the ED complaining of nausea and vomiting.  Patient reports that he has had almost 2 days of being unable to keep down either liquids or solids.  This is associated with diffuse abdominal pain starting in his periumbilical area as well as some sharp pain moving up into his chest.  He denies any fevers, cough, shortness of breath, or diarrhea.  He has not had any burning when he urinates or hematuria.  He does admit that he has been out of his insulin for at least the past month, recently got it refilled yesterday but has not started taking it again.  He does take oral medication for his diabetes as well, which he states he has been compliant with.        Past Medical History:  Diagnosis Date  . Cancer [facility]) carcinoid  . Colon cancer ([place])   . GERD (gastroesophageal reflux disease)   . Hyperlipemia   . Hypertension   . Kidney stones   . Shortness of breath    with exertion  . Type II diabetes mellitus ([place])   . UTI (lower urinary tract infection)   . Ventral hernia     Patient Active Problem List   Diagnosis Date Noted  . AKI (acute kidney injury) ([place]) 02/20/2018  . DKA, type 2 ([place]) 12/30/2017  . Weakness 01/02/2017  . Hypokalemia 01/02/2017  . Hypomagnesemia 01/02/2017  . Seroma complicating a procedure 02/21/2014  . Recurrent ventral incisional hernia 01/20/2014  . Gastroparesis 05/27/2012  . Nausea and vomiting 03/25/2012  . History of malignant carcinoid tumor 03/25/2012  . FINGER SPRAIN 03/19/2010  . GERD 09/07/2009  . ANKLE INJURY, LEFT  05/31/2009  . ABDOMINAL PAIN 05/19/2009  . HEMATURIA, HX OF 05/19/2009  . VENTRAL HERNIA, INCISIONAL 12/23/2008  . URINARY TRACT INFECTION, ACUTE 10/21/2008  . TINEA PEDIS 09/14/2008  . Secondary neuroendocrine tumors ([place]) 07/22/2008  . ABRASION 03/16/2008  . Type 2 diabetes mellitus with hyperlipidemia ([place]) 05/18/2007  . Hyperlipidemia 05/18/2007  . ANGER 05/18/2007  . Essential hypertension 05/18/2007    Past Surgical History:  Procedure Laterality Date  . ABDOMINAL HERNIA REPAIR  01/20/2014   recurrent VHR w/mesh  . CHOLECYSTECTOMY    . COLECTOMY  07/2008   "got all the cancer out"  . HERNIA REPAIR  02/2009; 01/20/2014   IHR; VHR  . INSERTION OF MESH N/A 01/20/2014   Procedure: INSERTION OF MESH;  Surgeon: [name]. [name], MD;  Location: [place];  Service: General;  Laterality: N/A;  . [place]   "had to open me up after they busted; cut just above pubis"  . VENTRAL HERNIA REPAIR N/A 01/20/2014   Procedure: OPEN REPAIR OF A RECURRENT VENTRAL HERNIA REPAIR;  Surgeon: [name]. [name], MD;  Location: [place];  Service: General;  Laterality: N/A;    Prior to Admission medications   Medication Sig Start Date End Date Taking? Authorizing Provider  empagliflozin (JARDIANCE) 25 MG TABS tablet Take 25 mg by mouth daily.   Yes [provider]  hydrochlorothiazide (MICROZIDE) 12.5 MG capsule Take 12.5 mg by  mouth daily.   Yes [provider]  Insulin Degludec (TRESIBA FLEXTOUCH) 200 UNIT/ML SOPN Inject 40 Units into the skin every evening. Patient taking differently: Inject 40 Units into the skin in the morning.  05/15/18  Yes Elby Beck, FNP  lisinopril-hydrochlorothiazide (ZESTORETIC) 10-12.5 MG tablet Take 1 tablet by mouth daily.   Yes [provider]  naproxen (NAPROSYN) 500 MG tablet Take 500 mg by mouth 2 (two) times daily with a meal.   Yes [provider]  potassium chloride (MICRO-K) 10 MEQ CR capsule Take 10 mEq by mouth  2 (two) times daily.   Yes [provider]  rosuvastatin (CRESTOR) 20 MG tablet Take 1 tablet (20 mg total) by mouth daily. 05/21/18  Yes Elby Beck, FNP  naproxen sodium (ALEVE) 220 MG tablet Take 220 mg by mouth. Patient not taking: Reported on 03/29/2020    [provider]    Allergies Patient has no known allergies.  Family History  Problem Relation Age of Onset  . Cancer Mother 33       breast cancer  . Breast cancer Mother   . Diabetes Mother   . Emphysema Father   . Diabetes Sister     Social History Social History   Tobacco Use  . Smoking status: Never Smoker  . Smokeless tobacco: Never Used  Vaping Use  . Vaping Use: Never used  Substance Use Topics  . Alcohol use: No  . Drug use: No    Review of Systems  Constitutional: No fever/chills Eyes: No visual changes. ENT: No sore throat. Cardiovascular: Positive for chest pain. Respiratory: Denies shortness of breath. Gastrointestinal: Positive for abdominal pain.  Positive for nausea and vomiting.  No diarrhea.  No constipation. Genitourinary: Negative for dysuria. Musculoskeletal: Negative for back pain. Skin: Negative for rash. Neurological: Negative for headaches, focal weakness or numbness.  ____________________________________________   PHYSICAL EXAM:  VITAL SIGNS: ED Triage Vitals [03/29/20 1216]  Enc Vitals Group     BP 96/67     Pulse Rate (!) 140     Resp 18     Temp 98.2 F (36.8 C)     Temp Source Oral     SpO2 96 %     Weight 210 lb (95.3 kg)     Height 5\' 11"  (1.803 m)     Head Circumference      Peak Flow      Pain Score 0     Pain Loc      Pain Edu?      Excl. in [place]?     Constitutional: Alert and oriented. Eyes: Conjunctivae are normal. Head: Atraumatic. Nose: No congestion/rhinnorhea. Mouth/Throat: Mucous membranes are very dry. Neck: Normal ROM Cardiovascular: Tachycardic, regular rhythm. Grossly normal heart sounds. Respiratory: Tachypneic with  normal respiratory effort.  No retractions. Lungs CTAB. Gastrointestinal: Soft and diffusely tender to palpation with no rebound or guarding. No distention. Genitourinary: deferred Musculoskeletal: No lower extremity tenderness nor edema. Neurologic:  Normal speech and language. No gross focal neurologic deficits are appreciated. Skin:  Skin is warm, dry and intact. No rash noted. Psychiatric: Mood and affect are normal. Speech and behavior are normal.  ____________________________________________   LABS (all labs ordered are listed, but only abnormal results are displayed)  Labs Reviewed  COMPREHENSIVE METABOLIC PANEL - Abnormal; Notable for the following components:      Result Value   Sodium 134 (*)    Chloride 91 (*)    CO2 10 (*)  Glucose, Bld 300 (*)    BUN 29 (*)    Creatinine, Ser 1.97 (*)    Total Protein 9.3 (*)    Total Bilirubin 2.4 (*)    GFR calc non Af Amer 38 (*)    GFR calc Af Amer 44 (*)    Anion gap 33 (*)    All other components within normal limits  CBC - Abnormal; Notable for the following components:   WBC 15.6 (*)    Hemoglobin 19.1 (*)    HCT 53.0 (*)    All other components within normal limits  LACTIC ACID, PLASMA - Abnormal; Notable for the following components:   Lactic Acid, Venous 5.7 (*)    All other components within normal limits  LACTIC ACID, PLASMA - Abnormal; Notable for the following components:   Lactic Acid, Venous 5.1 (*)    All other components within normal limits  BASIC METABOLIC PANEL - Abnormal; Notable for the following components:   Chloride 97 (*)    CO2 14 (*)    Glucose, Bld 207 (*)    BUN 30 (*)    Creatinine, Ser 1.63 (*)    Calcium 8.7 (*)    GFR calc non Af Amer 48 (*)    GFR calc Af Amer 56 (*)    Anion gap 26 (*)    All other components within normal limits  BETA-HYDROXYBUTYRIC ACID - Abnormal; Notable for the following components:   Beta-Hydroxybutyric Acid 6.65 (*)    All other components within normal  limits  GLUCOSE, CAPILLARY - Abnormal; Notable for the following components:   Glucose-Capillary 298 (*)    All other components within normal limits  GLUCOSE, CAPILLARY - Abnormal; Notable for the following components:   Glucose-Capillary 282 (*)    All other components within normal limits  GLUCOSE, CAPILLARY - Abnormal; Notable for the following components:   Glucose-Capillary 199 (*)    All other components within normal limits  GLUCOSE, CAPILLARY - Abnormal; Notable for the following components:   Glucose-Capillary 164 (*)    All other components within normal limits  CULTURE, BLOOD (ROUTINE X 2)  CULTURE, BLOOD (ROUTINE X 2)  SARS CORONAVIRUS 2 BY RT PCR (HOSPITAL ORDER, [place] [facility])  URINE CULTURE  LIPASE, BLOOD  MAGNESIUM  URINALYSIS, COMPLETE (UACMP) WITH MICROSCOPIC  BLOOD GAS, VENOUS  BASIC METABOLIC PANEL  BASIC METABOLIC PANEL  BASIC METABOLIC PANEL  BETA-HYDROXYBUTYRIC ACID  CBG MONITORING, ED  TROPONIN I (HIGH SENSITIVITY)   ____________________________________________  EKG  ED ECG REPORT I, Blake Divine, the attending physician, personally viewed and interpreted this ECG.   Date: 03/29/2020  EKG Time: 12:19  Rate: 136  Rhythm: sinus tachycardia  Axis: LLAD  Intervals:none  ST&T Change: None   PROCEDURES  Procedure(s) performed (including Critical Care):  .Critical Care Performed by: Blake Divine, MD Authorized by: Blake Divine, MD   Critical care provider statement:    Critical care time (minutes):  45   Critical care time was exclusive of:  Separately billable procedures and treating other patients and teaching time   Critical care was necessary to treat or prevent imminent or life-threatening deterioration of the following conditions:  Metabolic crisis   Critical care was time spent personally by me on the following activities:  Discussions with consultants, evaluation of patient's response to treatment,  examination of patient, ordering and performing treatments and interventions, ordering and review of laboratory studies, ordering and review of radiographic studies, pulse oximetry, re-evaluation  of patient's condition, obtaining history from patient or surrogate and review of old charts   I assumed direction of critical care for this patient from another provider in my specialty: no       ____________________________________________   INITIAL IMPRESSION / ASSESSMENT AND PLAN / ED COURSE       55 year old male with past medical history of hypertension, diabetes, colon cancer status post colectomy, ventral hernia status post repair who presents to the ED complaining of persistent nausea and vomiting over the past couple of days with diffuse abdominal pain.  Lab work appears consistent with DKA given hyperglycemia, increased anion gap, and bicarb of 10.  Patient appears very dehydrated, especially given his tachycardia, and we will hydrate with a total of 3 L of IV fluids.  Patient to be started on insulin drip, we will also assess for infectious process or cardiac process.  EKG shows no evidence of arrhythmia or ischemia, chest pain described is atypical and we will screen troponin.  Lactate noted to be elevated, will recheck following IV fluid hydration.  CT scan of abdomen/pelvis is pending to assess for signs of bowel ischemia or infectious process, although I suspect his abdominal pain is secondary to DKA.  Lactate is downtrending following IV fluid bolus although it remains significantly elevated and we will continue to trend.  CT scan is concerning for urinary obstruction with marked distention of bladder and bilateral hydronephrosis, no evidence of obstructing stone.  Foley catheter was placed with 1500 cc of urine out, given significant urinary retention we will treat empirically for UTI.  Chest x-ray shows no evidence of infectious process.  BMP shows improving anion gap and bicarb, patient's  heart rate is gradually improving.  Case was discussed with hospitalist for admission.      ____________________________________________   FINAL CLINICAL IMPRESSION(S) / ED DIAGNOSES  Final diagnoses:  Diabetic ketoacidosis without coma associated with type 2 diabetes mellitus ([place])  Urinary retention     ED Discharge Orders    None       Note:  This document was prepared using Dragon voice recognition software and may include unintentional dictation errors.   Blake Divine, MD 03/29/20 1710

## 2020-03-29 NOTE — Plan of Care (Addendum)
Pt's daughter to pick up pt's home medications in the morning. Problem: Education: Goal: Knowledge of General Education information will improve Description: Including pain rating scale, medication(s)/side effects and non-pharmacologic comfort measures Outcome: Progressing   Problem: Health Behavior/Discharge Planning: Goal: Ability to manage health-related needs will improve Outcome: Progressing   Problem: Clinical Measurements: Goal: Ability to maintain clinical measurements within normal limits will improve Outcome: Progressing Goal: Respiratory complications will improve Outcome: Progressing Goal: Cardiovascular complication will be avoided Outcome: Progressing   Problem: Activity: Goal: Risk for activity intolerance will decrease Outcome: Progressing

## 2020-03-30 ENCOUNTER — Ambulatory Visit: Payer: Self-pay | Admitting: Family Medicine

## 2020-03-30 LAB — TROPONIN I (HIGH SENSITIVITY)
Troponin I (High Sensitivity): 16 ng/L (ref <18)
Troponin I (High Sensitivity): 14 ng/L (ref <18)

## 2020-03-30 LAB — BASIC METABOLIC PANEL
Anion gap: 11 (ref 5–15)
Anion gap: 17 — ABNORMAL HIGH (ref 5–15)
BUN: 22 mg/dL — ABNORMAL HIGH (ref 6–20)
BUN: 24 mg/dL — ABNORMAL HIGH (ref 6–20)
CO2: 19 mmol/L — ABNORMAL LOW (ref 22–32)
CO2: 22 mmol/L (ref 22–32)
Calcium: 8.6 mg/dL — ABNORMAL LOW (ref 8.9–10.3)
Calcium: 8.7 mg/dL — ABNORMAL LOW (ref 8.9–10.3)
Chloride: 101 mmol/L (ref 98–111)
Chloride: 104 mmol/L (ref 98–111)
Creatinine, Ser: 1.05 mg/dL (ref 0.61–1.24)
Creatinine, Ser: 1.18 mg/dL (ref 0.61–1.24)
GFR calc Af Amer: >60 mL/min (ref >60)
GFR calc Af Amer: >60 mL/min (ref >60)
GFR calc non Af Amer: >60 mL/min (ref >60)
GFR calc non Af Amer: >60 mL/min (ref >60)
Glucose, Bld: 141 mg/dL — ABNORMAL HIGH (ref 70–99)
Glucose, Bld: 144 mg/dL — ABNORMAL HIGH (ref 70–99)
Potassium: 3.3 mmol/L — ABNORMAL LOW (ref 3.5–5.1)
Potassium: 3.6 mmol/L (ref 3.5–5.1)
Sodium: 137 mmol/L (ref 135–145)
Sodium: 137 mmol/L (ref 135–145)

## 2020-03-30 LAB — LACTIC ACID, PLASMA: Lactic Acid, Venous: 1 mmol/L (ref 0.5–1.9)

## 2020-03-30 LAB — GLUCOSE, CAPILLARY
Glucose-Capillary: 146 mg/dL — ABNORMAL HIGH (ref 70–99)
Glucose-Capillary: 148 mg/dL — ABNORMAL HIGH (ref 70–99)
Glucose-Capillary: 164 mg/dL — ABNORMAL HIGH (ref 70–99)
Glucose-Capillary: 166 mg/dL — ABNORMAL HIGH (ref 70–99)
Glucose-Capillary: 141 mg/dL — ABNORMAL HIGH (ref 70–99)
Glucose-Capillary: 151 mg/dL — ABNORMAL HIGH (ref 70–99)
Glucose-Capillary: 144 mg/dL — ABNORMAL HIGH (ref 70–99)
Glucose-Capillary: 127 mg/dL — ABNORMAL HIGH (ref 70–99)
Glucose-Capillary: 124 mg/dL — ABNORMAL HIGH (ref 70–99)
Glucose-Capillary: 141 mg/dL — ABNORMAL HIGH (ref 70–99)
Glucose-Capillary: 105 mg/dL — ABNORMAL HIGH (ref 70–99)
Glucose-Capillary: 150 mg/dL — ABNORMAL HIGH (ref 70–99)

## 2020-03-30 LAB — URINE CULTURE: Culture: NO GROWTH

## 2020-03-30 LAB — CBC
HCT: 47.4 % (ref 39.0–52.0)
Hemoglobin: 17.1 g/dL — ABNORMAL HIGH (ref 13.0–17.0)
MCH: 33.5 pg (ref 26.0–34.0)
MCHC: 36.1 g/dL — ABNORMAL HIGH (ref 30.0–36.0)
MCV: 92.8 fL (ref 80.0–100.0)
Platelets: 187 10*3/uL (ref 150–400)
RBC: 5.11 MIL/uL (ref 4.22–5.81)
RDW: 11.9 % (ref 11.5–15.5)
WBC: 11.4 10*3/uL — ABNORMAL HIGH (ref 4.0–10.5)
nRBC: 0 % (ref 0.0–0.2)

## 2020-03-30 LAB — HEMOGLOBIN A1C
Hgb A1c MFr Bld: 7.1 % — ABNORMAL HIGH (ref 4.8–5.6)
Mean Plasma Glucose: 157.07 mg/dL

## 2020-03-30 LAB — BETA-HYDROXYBUTYRIC ACID: Beta-Hydroxybutyric Acid: 5.1 mmol/L — ABNORMAL HIGH (ref 0.05–0.27)

## 2020-03-30 LAB — HIV ANTIBODY (ROUTINE TESTING W REFLEX): HIV Screen 4th Generation wRfx: NONREACTIVE

## 2020-03-30 LAB — MAGNESIUM: Magnesium: 2.8 mg/dL — ABNORMAL HIGH (ref 1.7–2.4)

## 2020-03-30 MED ORDER — METOPROLOL TARTRATE 25 MG PO TABS
12.5000 mg | ORAL_TABLET | Freq: Two times a day (BID) | ORAL | Status: DC
  Administered 2020-03-30 – 2020-04-01 (×5): 12.5 mg via ORAL
  Filled 2020-03-30 (×5): qty 1

## 2020-03-30 MED ORDER — PROMETHAZINE HCL 25 MG/ML IJ SOLN: 25.0000 mg | Freq: Four times a day (QID) | INTRAMUSCULAR | Status: DC | PRN

## 2020-03-30 MED ORDER — INSULIN ASPART 100 UNIT/ML ~~LOC~~ SOLN
0.0000 [IU] | Freq: Three times a day (TID) | SUBCUTANEOUS | Status: DC
  Administered 2020-03-30 – 2020-03-31 (×5): 1 [IU] via SUBCUTANEOUS
  Filled 2020-03-30 (×5): qty 1

## 2020-03-30 MED ORDER — SODIUM CHLORIDE 0.9 % IV SOLN
INTRAVENOUS | Status: DC
  Administered 2020-03-30: 100 mL/h via INTRAVENOUS

## 2020-03-30 MED ORDER — MAGNESIUM SULFATE 2 GM/50ML IV SOLN
2.0000 g | Freq: Once | INTRAVENOUS | Status: AC
  Administered 2020-03-30: 2 g via INTRAVENOUS
  Filled 2020-03-30: qty 50

## 2020-03-30 MED ORDER — LABETALOL HCL 5 MG/ML IV SOLN: 10.0000 mg | INTRAVENOUS | Status: DC | PRN

## 2020-03-30 MED ORDER — INSULIN GLARGINE 100 UNIT/ML ~~LOC~~ SOLN
20.0000 [IU] | Freq: Every day | SUBCUTANEOUS | Status: DC
  Administered 2020-03-31 – 2020-04-01 (×2): 20 [IU] via SUBCUTANEOUS
  Filled 2020-03-30 (×3): qty 0.2

## 2020-03-30 MED ORDER — POTASSIUM CHLORIDE CRYS ER 20 MEQ PO TBCR
40.0000 meq | EXTENDED_RELEASE_TABLET | Freq: Once | ORAL | Status: AC
  Administered 2020-03-30: 40 meq via ORAL
  Filled 2020-03-30: qty 2

## 2020-03-30 MED ORDER — PANTOPRAZOLE SODIUM 40 MG IV SOLR
40.0000 mg | Freq: Once | INTRAVENOUS | Status: AC
  Administered 2020-03-30: 40 mg via INTRAVENOUS
  Filled 2020-03-30: qty 40

## 2020-03-30 MED ORDER — METOPROLOL TARTRATE 5 MG/5ML IV SOLN
2.5000 mg | Freq: Once | INTRAVENOUS | Status: AC
  Administered 2020-03-30: 2.5 mg via INTRAVENOUS
  Filled 2020-03-30: qty 5

## 2020-03-30 MED ORDER — POTASSIUM CHLORIDE IN NACL 20-0.9 MEQ/L-% IV SOLN
INTRAVENOUS | Status: DC
  Filled 2020-03-30 (×2): qty 1000

## 2020-03-30 MED ORDER — INSULIN GLARGINE 100 UNIT/ML ~~LOC~~ SOLN
10.0000 [IU] | Freq: Once | SUBCUTANEOUS | Status: AC
  Administered 2020-03-30: 10 [IU] via SUBCUTANEOUS
  Filled 2020-03-30: qty 0.1

## 2020-03-30 MED ORDER — INSULIN ASPART 100 UNIT/ML ~~LOC~~ SOLN: 0.0000 [IU] | Freq: Every day | SUBCUTANEOUS | Status: DC

## 2020-03-30 MED ORDER — INSULIN ASPART 100 UNIT/ML ~~LOC~~ SOLN
3.0000 [IU] | Freq: Three times a day (TID) | SUBCUTANEOUS | Status: DC
  Administered 2020-03-30 – 2020-04-01 (×6): 3 [IU] via SUBCUTANEOUS
  Filled 2020-03-30 (×6): qty 1

## 2020-03-30 MED ORDER — INSULIN GLARGINE 100 UNIT/ML ~~LOC~~ SOLN
10.0000 [IU] | Freq: Every day | SUBCUTANEOUS | Status: DC
  Administered 2020-03-30: 10 [IU] via SUBCUTANEOUS
  Filled 2020-03-30: qty 0.1

## 2020-03-30 NOTE — Progress Notes (Signed)
PROGRESS NOTE    [name]  IRW:[id number] DOB: Mar 28, 1966 DOA: 03/29/2020 PCP: Patient, No Pcp Per    Brief Narrative:  [name] is a 54 y.o. male with medical history significant of type 2 diabetes mellitus, hyperlipidemia, essential hypertension who presented to the ED with complaints of nausea and vomiting with diffuse abdominal pain.  Patient reports onset of symptoms roughly 2 days ago with also associated poor appetite.  Patient reports he has been out of his insulin for roughly 1 month and finally had it refilled yesterday.  Otherwise reports compliance with his home medications.  No recent sick contacts or changes in dietary habits.  Also reports decreased urine output over the past 1 day.  No other complaints or concerns at this time.  In the ED, Temperature 98.2, HR 140, RR 18, BP 96/67, SPO2 96% on room air. WBC count 15.6, hemoglobin 19.1, platelets 250.  Sodium 137, potassium 4.0, chloride 97, CO2 14, BUN 30, creatinine 1.97.  Glucose 300.  Anion gap 33.  Beta hydroxybutyrate acid 6.65.  Lactic acid 5.7.  Troponin X.  Chest x-ray with no acute cardiopulmonary disease process.  CT abdomen/pelvis with markedly distended urinary bladder with moderate left hydronephrosis/hydroureter and mild right hydronephrosis.  Blood cultures x2: Pending.  Patient was started on insulin drip, Foley catheter placed for urinary retention and patient given ceftriaxone 1 g IV.  ED physician referred patient for admission for further evaluation and treatment for underlying DKA.   Assessment & Plan:   Principal Problem:   DKA (diabetic ketoacidoses) ([place]) Active Problems:   Type 2 diabetes mellitus with hyperlipidemia ([place])   Essential hypertension   AKI (acute kidney injury) ([place])   DKA Hx T2DM Patient presenting with 2-day history of nausea/vomiting with diffuse abdominal pain, poor appetite and decreased urine output.  Complicated by being out of his home insulin over the past month.   Patient was noted to have an elevated glucose of 300 with a lactic acid of 5.7, anion gap of 33.  Beta hydroxybutyrate acid elevated 6.65. --Insulin drip transition to Lantus 20 units subcutaneously daily today --Continue NS at 100 mL's per hour --Insulin sliding scale for further coverage --CBGs before every meal/at bedtime --Diabetic educator following, appreciate assistance  Acute renal failure Nausea likely multifactorial with acute urinary retention and significant dehydration as patient in DKA.  CT abdomen/pelvis shows markedly distended urinary bladder with bilateral hydronephrosis.  Creatinine 1.97 on admission, baseline 0.77 and 2019 per review of EMR. --Cr 1.97-->1.63-->1.05 --Continue treatment with IV insulin as above and IV fluids --Continue to hold home HCTZ/lisinopril --Avoid nephrotoxins, renally dose all medications --Follow renal function closely daily  Acute urinary retention Bladder scan ED shows greater than 1000 mL of retained urine.  Foley catheter placed. --Start tamsulosin 0.4 mg p.o. daily --We will attempt voiding trial today with removal of Foley catheter --Her strict I's and O's; especially urinary output  Essential hypertension Patient on lisinopril 10 mg p.o. daily and HCTZ 25 mg p.o. daily.  BP 96/67. --We will hold home antihypertensives --Start metoprolol tartrate 12.5 mg p.o. twice daily for tachycardia --Labetalol 10 g IV every 4 hours as needed SBP greater than 170 --To monitor on telemetry  Hyperlipidemia --hold home Crestor 20 mg p.o. daily for now   DVT prophylaxis: Lovenox Code Status: Full code Family Communication: Updated patient's daughter, Raquel Sarna via telephone this afternoon  Disposition Plan:  Status is: Inpatient  Remains inpatient appropriate because:Unsafe d/c plan and IV treatments appropriate  due to intensity of illness or inability to take PO   Dispo: The patient is from: Home              Anticipated d/c is to: Home               Anticipated d/c date is: 2 days              Patient currently is not medically stable to d/c.   Consultants:   none  Procedures:   Foley 7/21 - 7/22  Antimicrobials:   Ceftriaxone 7/21 - 7/21   Subjective: Patient seen and examined bedside, resting comfortably.  Transitioned off of insulin drip this morning.  Abdominal discomfort improved.  Will have Foley catheter removed for voiding trial today.  No other complaints or concerns at this time.  Denies headache, no fever/chills/night sweats, no nausea/vomiting/diarrhea, no chest pain, no palpitations, no shortness of breath.  No acute events overnight per nursing staff.  Updated patient's daughter, Raquel Sarna via telephone this afternoon.  Okay to transfer to floor.  Objective: Vitals:   03/30/20 1000 03/30/20 1009 03/30/20 1100 03/30/20 1200  BP: (!) 138/81 138/81 (!) 128/87 (!) 138/83  Pulse: (!) 116 (!) 130 (!) 125 (!) 114  Resp: 18  20 14   Temp:      TempSrc:      SpO2: 98%  96% 98%  Weight:      Height:        Intake/Output Summary (Last 24 hours) at 03/30/2020 1640 Last data filed at 03/30/2020 1402 Gross per 24 hour  Intake 3176.4 ml  Output 4150 ml  Net -973.6 ml   Filed Weights   03/29/20 1216 03/29/20 2040  Weight: 95.3 kg 97 kg    Examination:  General exam: Appears calm and comfortable  Respiratory system: Clear to auscultation. Respiratory effort normal. Cardiovascular system: S1 & S2 heard, RRR. No JVD, murmurs, rubs, gallops or clicks. No pedal edema. Gastrointestinal system: Abdomen is nondistended, soft and nontender. No organomegaly or masses felt. Normal bowel sounds heard. Central nervous system: Alert and oriented. No focal neurological deficits. Extremities: Symmetric 5 x 5 power. Skin: No rashes, lesions or ulcers Psychiatry: Judgement and insight appear normal. Mood & affect appropriate.     Data Reviewed: I have personally reviewed following labs and imaging  studies  CBC: Recent Labs  Lab 03/29/20 1231 03/30/20 0346  WBC 15.6* 11.4*  HGB 19.1* 17.1*  HCT 53.0* 47.4  MCV 94.5 92.8  PLT 250 782   Basic Metabolic Panel: Recent Labs  Lab 03/29/20 1231 03/29/20 1544 03/29/20 1958 03/29/20 2353 03/30/20 0346  NA 134* 137 135 137 137  K 4.1 4.0 3.4* 3.6 3.3*  CL 91* 97* 97* 101 104  CO2 10* 14* 18* 19* 22  GLUCOSE 300* 207* 175* 141* 144*  BUN 29* 30* 27* 24* 22*  CREATININE 1.97* 1.63* 1.36* 1.18 1.05  CALCIUM 9.1 8.7* 8.6* 8.7* 8.6*  MG 2.4  --   --   --  2.8*   GFR: Estimated Creatinine Clearance: 98.9 mL/min (by C-G formula based on SCr of 1.05 mg/dL). Liver Function Tests: Recent Labs  Lab 03/29/20 1231  AST 27  ALT 28  ALKPHOS 105  BILITOT 2.4*  PROT 9.3*  ALBUMIN 5.0   Recent Labs  Lab 03/29/20 1231  LIPASE 48   No results for input(s): AMMONIA in the last 168 hours. Coagulation Profile: No results for input(s): INR, PROTIME in the last 168 hours. Cardiac Enzymes: No  results for input(s): CKTOTAL, CKMB, CKMBINDEX, TROPONINI in the last 168 hours. BNP (last 3 results) No results for input(s): PROBNP in the last 8760 hours. HbA1C: Recent Labs    03/29/20 2056  HGBA1C 7.1*   CBG: Recent Labs  Lab 03/30/20 0607 03/30/20 0652 03/30/20 0757 03/30/20 1205 03/30/20 1555  GLUCAP 144* 127* 124* 141* 105*   Lipid Profile: No results for input(s): CHOL, HDL, LDLCALC, TRIG, CHOLHDL, LDLDIRECT in the last 72 hours. Thyroid Function Tests: Recent Labs    03/29/20 2056  TSH 1.946   Anemia Panel: No results for input(s): VITAMINB12, FOLATE, FERRITIN, TIBC, IRON, RETICCTPCT in the last 72 hours. Sepsis Labs: Recent Labs  Lab 03/29/20 1231 03/29/20 1544 03/30/20 0346  LATICACIDVEN 5.7* 5.1* 1.0    Recent Results (from the past 240 hour(s))  Culture, blood (routine x 2)     Status: None (Preliminary result)   Collection Time: 03/29/20 12:31 PM   Specimen: BLOOD  Result Value Ref Range Status    Specimen Description BLOOD RIGHT ANTECUBITAL  Final   Special Requests   Final    BOTTLES DRAWN AEROBIC AND ANAEROBIC Blood Culture adequate volume   Culture   Final    NO GROWTH < 12 HOURS Performed at [facility], [address] [place], [place] [address]    Report Status PENDING  Incomplete  Culture, blood (routine x 2)     Status: None (Preliminary result)   Collection Time: 03/29/20  3:44 PM   Specimen: BLOOD  Result Value Ref Range Status   Specimen Description BLOOD BLOOD RIGHT HAND  Final   Special Requests   Final    BOTTLES DRAWN AEROBIC AND ANAEROBIC Blood Culture adequate volume   Culture   Final    NO GROWTH < 12 HOURS Performed at [facility], [address] [place], [place] [address]    Report Status PENDING  Incomplete  SARS Coronavirus 2 by RT PCR (hospital order, performed in [place] hospital lab) Nasopharyngeal Nasopharyngeal Swab     Status: None   Collection Time: 03/29/20  5:31 PM   Specimen: Nasopharyngeal Swab  Result Value Ref Range Status   SARS Coronavirus 2 NEGATIVE NEGATIVE Final    Comment: (NOTE) SARS-CoV-2 target nucleic acids are NOT DETECTED.  The SARS-CoV-2 RNA is generally detectable in upper and lower respiratory specimens during the acute phase of infection. The lowest concentration of SARS-CoV-2 viral copies this assay can detect is 250 copies / mL. A negative result does not preclude SARS-CoV-2 infection and should not be used as the sole basis for treatment or other patient management decisions.  A negative result may occur with improper specimen collection / handling, submission of specimen other than nasopharyngeal swab, presence of viral mutation(s) within the areas targeted by this assay, and inadequate number of viral copies (<250 copies / mL). A negative result must be combined with clinical observations, patient history, and epidemiological information.  Fact Sheet for Patients:    StrictlyIdeas.no  Fact Sheet for Healthcare Providers: BankingDealers.co.za  This test is not yet approved or  cleared by the [place] FDA and has been authorized for detection and/or diagnosis of SARS-CoV-2 by FDA under an Emergency Use Authorization (EUA).  This EUA will remain in effect (meaning this test can be used) for the duration of the COVID-19 declaration under Section 564(b)(1) of the Act, 21 U.S.C. section 360bbb-3(b)(1), unless the authorization is terminated or revoked sooner.  Performed at [facility], [address] [place], [place] [address]   MRSA PCR Screening     Status: None   Collection Time: 03/29/20  8:46 PM   Specimen: Nasopharyngeal  Result Value Ref Range Status   MRSA by PCR NEGATIVE NEGATIVE Final    Comment:        The GeneXpert MRSA Assay (FDA approved for NASAL specimens only), is one component of a comprehensive MRSA colonization surveillance program. It is not intended to diagnose MRSA infection nor to guide or monitor treatment for MRSA infections. Performed at [facility], [address], [place], [place] [address]          Radiology Studies: CT Abdomen Pelvis W Contrast  Result Date: 03/29/2020 CLINICAL DATA:  Abdominal pain, acute and not localized. Nausea, vomiting, dizziness for 2 days. History of diabetes and colon cancer. EXAM: CT ABDOMEN AND PELVIS WITH CONTRAST TECHNIQUE: Multidetector CT imaging of the abdomen and pelvis was performed using the standard protocol following bolus administration of intravenous contrast. CONTRAST:  106mL OMNIPAQUE IOHEXOL 300 MG/ML  SOLN COMPARISON:  11/04/2017 FINDINGS: Lower chest: Lung bases are unremarkable. Heart size is normal. There is diffuse thickening of the wall of the LOWER esophagus, increased compared to prior study but raising the question of esophagitis. Hepatobiliary: Status post cholecystectomy.  Liver is  unremarkable. Pancreas: Unremarkable. No pancreatic ductal dilatation or surrounding inflammatory changes. Spleen: Normal in size without focal abnormality. Adrenals/Urinary Tract: Adrenal glands are normal. There is moderate LEFT hydronephrosis with tortuous dilated LEFT ureter to the level of the urinary bladder. No obstructing stone identified. Simple cyst in the LOWER pole the LEFT kidney is 3.0 centimeters and stable. There is mild RIGHT hydronephrosis. RIGHT ureter is unremarkable. The urinary bladder is marker markedly distended. No bladder stones identified. Visualized portion of the urethra is unremarkable. Stomach/Bowel: Stomach is unremarkable. Small bowel loops are normal in caliber and wall thickness. Status post RIGHT hemicolectomy. The anastomosis is unremarkable. Distal colon is normal in appearance. Redundant sigmoid colon noted. Vascular/Lymphatic: There is minimal atherosclerotic calcification of the abdominal aorta. No associated aneurysm. No lymphadenopathy. Reproductive: Few calcifications within the prostate gland. Other: No ascites. Anterior abdominal wall surgical changes. No hernia. Musculoskeletal: Degenerative changes are seen in the lumbar spine. IMPRESSION: 1. Markedly distended urinary bladder. 2. Moderate LEFT hydronephrosis and hydroureter to the level of the urinary bladder. No obstructing stone identified. Considerations include bladder outlet obstruction and possible LEFT ureteral stricture. 3. Mild RIGHT hydronephrosis without obstructing stone. 4. Thickened distal esophageal wall, raising the question of esophagitis. 5. Status post RIGHT hemicolectomy.  Unremarkable anastomosis. 6. Status post cholecystectomy. 7. Aortic Atherosclerosis (ICD10-I70.0). Electronically Signed   By: Nolon Nations [name].   On: 03/29/2020 15:46   DG Chest Portable 1 View  Result Date: 03/29/2020 CLINICAL DATA:  Chest pain nausea vomiting dizziness EXAM: PORTABLE CHEST 1 VIEW COMPARISON:   12/29/2017 FINDINGS: The heart size and mediastinal contours are within normal limits. Both lungs are clear. The visualized skeletal structures are unremarkable. IMPRESSION: No active disease. Electronically Signed   By: Donavan Foil [name].   On: 03/29/2020 15:09        Scheduled Meds: . Chlorhexidine Gluconate Cloth  6 each Topical Q0600  . enoxaparin (LOVENOX) injection  40 mg Subcutaneous Q24H  . insulin aspart  0-5 Units Subcutaneous QHS  . insulin aspart  0-9 Units Subcutaneous TID WC  . insulin aspart  3 Units Subcutaneous TID WC  . [START ON 03/31/2020] insulin glargine  20 Units Subcutaneous Daily  .  metoprolol tartrate  12.5 mg Oral BID  . tamsulosin  0.4 mg Oral Daily   Continuous Infusions: . sodium chloride 100 mL/hr (03/30/20 1011)     LOS: 1 day    Time spent: 36 minutes spent on chart review, discussion with nursing staff, consultants, updating family and interview/physical exam; more than 50% of that time was spent in counseling and/or coordination of care.    Bricelyn Freestone J [place] DO [facility] Available via Epic secure chat 7am-7pm After these hours, please refer to coverage provider listed on [email or web address] 03/30/2020, 4:40 PM

## 2020-03-30 NOTE — ED Notes (Signed)
Bladder scanned verbal order from [place], MD. Scan noted greater than 1248 ml of urinary retention. Order for foley catheter.

## 2020-03-30 NOTE — Progress Notes (Signed)
Report given to [facility] on 1c, transferred via wheelchair with tech. Stable condition.

## 2020-03-30 NOTE — Care Management Important Message (Signed)
Important Message  Patient Details  Name: [name] MRN: [id number] Date of Birth: [birth year]   Medicare Important Message Given:  N/A - LOS <3 / Initial given by admissions     Dannette Barbara 03/30/2020, 2:23 PM

## 2020-03-30 NOTE — Progress Notes (Signed)
Initial Nutrition Assessment  DOCUMENTATION CODES:   Not applicable  INTERVENTION:  Provide Ensure Max Protein po BID, each supplement provides 150 kcal and 30 grams of protein.  Provided diet education (note to follow).  NUTRITION DIAGNOSIS:   Inadequate oral intake related to decreased appetite, nausea as evidenced by per patient/family report.  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   Met with patient at bedside. He is known to this RD from an assessment in April of 2018. Patient reports he typically has a good appetite and intake at baseline. He reports typically eating 2-3 meals per day. Patient unable to describe typical intake. He reports he is nauseas now so he has a decreased appetite and intake. He ate 50% of his breakfast this morning and 0% of his lunch. Patient reports he is familiar with carbohydrate counting. Provided diet education.  Patient reports his UBW was 250-260 lbs and he denied any weight loss. However currently patient is documented to be 97 kg (213.85 lbs). Last weight in chart was 110.2 kg on 06/17/2018. No recent weight history to trend to see when weight loss occurred.   Medications reviewed and include: Novolog 3 units TID with meals, Lantus 20 units daily, Novolog 0-9 units TID with meals, Novolog 0-5 units QHS, NS at 100 mL/hr.  Labs reviewed: CBG 124-144, Potassium 3.3, BUN 22, Magnesium 2.8.  Patient does not meet criteria for malnutrition at this time.  NUTRITION - FOCUSED PHYSICAL EXAM:    Most Recent Value  Orbital Region No depletion  Upper Arm  Region No depletion  Thoracic and Lumbar Region No depletion  Buccal Region No depletion  Temple Region No depletion  Clavicle Bone Region No depletion  Clavicle and Acromion Bone Region No depletion  Scapular Bone Region No depletion  Dorsal Hand No depletion  Patellar Region No depletion  Anterior Thigh Region No depletion  Posterior Calf Region No depletion  Edema (RD Assessment) None  Hair Reviewed  Eyes Reviewed  Mouth Reviewed  Skin Reviewed  Nails Reviewed     Diet Order:   Diet Order            Diet Carb Modified Fluid consistency: Thin; Room service appropriate? Yes  Diet effective now                EDUCATION NEEDS:   Education needs have been addressed  Skin:  Skin Assessment: Reviewed RN Assessment  Last BM:  03/29/2020  Height:   Ht Readings from Last 1 Encounters:  03/29/20 '5\' 11"'  (1.803 m)   Weight:   Wt Readings from Last 1 Encounters:  03/29/20 97 kg   BMI:  Body mass index is 29.83 kg/m.  Estimated Nutritional Needs:   Kcal:  2200-2400  Protein:  110-120 grams  Fluid:  2.2-2.4 L/day  Jacklynn Barnacle, MS, RD, LDN Pager number available on Amion

## 2020-03-30 NOTE — Plan of Care (Signed)
°  RD provided nutrition education regarding diabetes.   Lab Results  Component Value Date   HGBA1C 7.1 (H) 03/29/2020    RD provided "Carbohydrate Counting for People with Diabetes" handout from the Academy of Nutrition and Dietetics. Discussed different food groups and their effects on blood sugar, emphasizing carbohydrate-containing foods. Provided list of carbohydrates and recommended serving sizes of common foods.  Discussed importance of controlled and consistent carbohydrate intake throughout the day. Provided examples of ways to balance meals/snacks and encouraged intake of high-fiber, whole grain complex carbohydrates. Teach back method used.  Expect good compliance.  Current diet order is carbohydrate modified, patient is consuming approximately 0-20% of meals at this time. Labs and medications reviewed. RD contact information provided. RD will continue to follow patient during hospitalization.  Jacklynn Barnacle, MS, RD, LDN Pager number available on Amion

## 2020-03-30 NOTE — Progress Notes (Addendum)
Good day. No more vomitting or diarrhea today past early am. Has not voided since 10am and states he does not have to go.Voiderd for NT earlier today.  Patient is slow but uses this as an excuse for many things. Up to the [facility] without difficulties.

## 2020-03-30 NOTE — Progress Notes (Signed)
Inpatient Diabetes Program Recommendations  AACE/ADA: New Consensus Statement on Inpatient Glycemic Control   Target Ranges:  Prepandial:   less than 140 mg/dL      Peak postprandial:   less than 180 mg/dL (1-2 hours)      Critically ill patients:  140 - 180 mg/dL   Results for BRAEDYN, KAUK (MRN [id number]) as of 03/30/2020 08:13  Ref. Range 03/30/2020 00:20 03/30/2020 01:15 03/30/2020 02:21 03/30/2020 03:15 03/30/2020 04:14 03/30/2020 05:12 03/30/2020 06:07 03/30/2020 06:52 03/30/2020 07:57  Glucose-Capillary Latest Ref Range: 70 - 99 mg/dL 166 (H) 146 (H) 164 (H) 148 (H) 141 (H) 151 (H) 144 (H) 127 (H) 124 (H)  Results for RODELL, MARRS (MRN [id number]) as of 03/30/2020 08:13  Ref. Range 03/29/2020 12:31 03/29/2020 15:44  Beta-Hydroxybutyric Acid Latest Ref Range: 0.05 - 0.27 mmol/L  6.65 (H)  Glucose Latest Ref Range: 70 - 99 mg/dL 300 (H) 207 (H)  Results for BENANCIO, OSMUNDSON (MRN [id number]) as of 03/30/2020 08:13  Ref. Range 03/29/2020 20:56  Hemoglobin A1C Latest Ref Range: 4.8 - 5.6 % 7.1 (H)   Review of Glycemic Control  Diabetes history: DM2 Outpatient Diabetes medications: Tresiba 40 units QPM, Jardiance 25 mg daily Current orders for Inpatient glycemic control: Lantus 10 units daily, Novolog 3 units TID with meals, Novolog 0-9 units TID with meals, Novolog 0-5 units QHS  Inpatient Diabetes Program Recommendations:    HbgA1C:  A1C 7.1% on 03/29/20 indicating an average glucose of 157 mg/dl over the past 2-3 months.  NOTE: Per chart noted office note by M. [place], [place] at [place] on 11/11/19 which notes patient taking same outpatient DM medications as listed above and A1C was 6.8% at that time. Per H&P patient reported being out of [place] for about 1 month and just got it refilled on 03/28/20. Initial lab glucose 300 mg/dl on 03/29/20 and patient was started on IV insulin. Patient received Lantus 10 units at 5:55 am today and has SQ insulin transition orders.   Addendum  03/30/20@11 :15-Spoke with patient regarding DM management. Patient states that he is suppose to take Tresiba 40 units QPM and Jardiance 25 mg daily for DM control. Patient states that he ran out of insulin for a few weeks but he got a refill of his [place] Aas on the day before he came to the hospital. Patient states that he gets medications through [facility] and that he received 2 Tresiba insulin pens on 03/28/20. Patient states that he ran out of insulin and did not have a refrigerator where he is currently living so he waited until a refrigerator was purchased before getting his insulin refilled.  Patient reports that when he takes his insulin and his oral DM medication, his glucose is usually in the 100's but does go up in 200's at times. Informed patient of current A1c of 7.1% on 03/29/20 indicating an average glucose of 157 mg/dl. Being that patient has been out of [place] for several weeks, would have anticipated A1C to be higher. Encouraged patient to take DM medications consistently as prescribed and to follow up with provider. Patient states that he actually had an appointment with a new PCP to establish care today but he had to cancel the appointment since he was in the hospital. Encouraged patient to be sure to get a new appointment and to follow up with PCP consistently. Patient states that he has everything he needs at home for DM management. Patient verbalized understanding of information discussed and states  that he has no questions at this time. Will continue to follow along while inpatient.  Thanks, Barnie Alderman, RN, MSN, CDE Diabetes Coordinator Inpatient Diabetes Program [phone] (Team Pager from 8am to 5pm)

## 2020-03-31 LAB — GLUCOSE, CAPILLARY
Glucose-Capillary: 122 mg/dL — ABNORMAL HIGH (ref 70–99)
Glucose-Capillary: 168 mg/dL — ABNORMAL HIGH (ref 70–99)
Glucose-Capillary: 92 mg/dL (ref 70–99)

## 2020-03-31 LAB — BASIC METABOLIC PANEL
Anion gap: 17 — ABNORMAL HIGH (ref 5–15)
BUN: 27 mg/dL — ABNORMAL HIGH (ref 6–20)
CO2: 15 mmol/L — ABNORMAL LOW (ref 22–32)
Calcium: 8.4 mg/dL — ABNORMAL LOW (ref 8.9–10.3)
Chloride: 101 mmol/L (ref 98–111)
Creatinine, Ser: 1.08 mg/dL (ref 0.61–1.24)
GFR calc Af Amer: >60 mL/min (ref >60)
GFR calc non Af Amer: >60 mL/min (ref >60)
Glucose, Bld: 131 mg/dL — ABNORMAL HIGH (ref 70–99)
Potassium: 3.3 mmol/L — ABNORMAL LOW (ref 3.5–5.1)
Sodium: 133 mmol/L — ABNORMAL LOW (ref 135–145)

## 2020-03-31 LAB — CBC
HCT: 42.7 % (ref 39.0–52.0)
Hemoglobin: 15.8 g/dL (ref 13.0–17.0)
MCH: 34.5 pg — ABNORMAL HIGH (ref 26.0–34.0)
MCHC: 37 g/dL — ABNORMAL HIGH (ref 30.0–36.0)
MCV: 93.2 fL (ref 80.0–100.0)
Platelets: 156 10*3/uL (ref 150–400)
RBC: 4.58 MIL/uL (ref 4.22–5.81)
RDW: 12.6 % (ref 11.5–15.5)
WBC: 9.2 10*3/uL (ref 4.0–10.5)
nRBC: 0 % (ref 0.0–0.2)

## 2020-03-31 LAB — MAGNESIUM: Magnesium: 2.3 mg/dL (ref 1.7–2.4)

## 2020-03-31 MED ORDER — POTASSIUM CHLORIDE CRYS ER 20 MEQ PO TBCR
30.0000 meq | EXTENDED_RELEASE_TABLET | ORAL | Status: AC
  Administered 2020-03-31 (×2): 30 meq via ORAL
  Filled 2020-03-31 (×2): qty 1

## 2020-03-31 NOTE — Evaluation (Signed)
Physical Therapy Evaluation Patient Details Name: [name] MRN: [id number] DOB: 1966-06-01 Today's Date: 03/31/2020   History of Present Illness  Mr. [name] is a 54 y/o male who was admitted for DKA. His presented to the ED with c/o nausea, vomiting, with diffuse abdominal pain x 2 days with onset on 7/19. Pt also with poor appetite and decreased urine output x 1 day. PMH incldes DM II, HLD, essential HTN, colon cancer, SOB with exacerbaiton, UTI, and ventral hernia.  Clinical Impression  Pt seated on BSC upon clinician's arrival to room and agreeable to PT session. Pt with poor initiation of all mobility and requires verbal cues for sequencing and supervision for safety. Pt presents with generalized weakness in a 4 extremities and requires CGA for transfers and ambulation.Pt ambulated within room with RW and noted slight LOB during 180 degree turn requiring min A for balance. Pt with decreased functional mobility tolerance. Pt required verbal cues for attention to task and continued participation during therex. Recommend skilled therapy during acute stay to address strength, balance, and endurance deficits. HHPT at discharge to optimize return to PLOF and maximize functional mobility/independence and decrease falls risk.     Follow Up Recommendations Home health PT;Supervision for mobility/OOB    Equipment Recommendations  Rolling walker with 5" wheels;3in1 (PT)    Recommendations for Other Services       Precautions / Restrictions Precautions Precautions: Fall Restrictions Weight Bearing Restrictions: No      Mobility  Bed Mobility               General bed mobility comments: pt OOB and seated on BSC upon arrival; bed mobility not attempted  Transfers Overall transfer level: Needs assistance Equipment used: Rolling walker (2 wheeled) Transfers: Sit to/from Stand Sit to Stand: Min guard         General transfer comment: pt required CGA for steadying during sit <>  stand transfers; pt with slow movements noted  Ambulation/Gait Ambulation/Gait assistance: Min guard Gait Distance (Feet): 20 Feet Assistive device: Rolling walker (2 wheeled) Gait Pattern/deviations: Step-through pattern Gait velocity: decreased   General Gait Details: pt utilized RW with CGA for steadying while ambulating 20 feet within room; min A for LOB to L while turning 180 degrees; noted decreased step length bilaterally however does maintain reciprocal pattern  Stairs            Wheelchair Mobility    Modified Rankin (Stroke Patients Only)       Balance Overall balance assessment: Needs assistance Sitting-balance support: Feet supported;Single extremity supported Sitting balance-Leahy Scale: Fair Sitting balance - Comments: noted pt with posterior lean while sitting; able to correct with verbal cues   Standing balance support: Bilateral upper extremity supported Standing balance-Leahy Scale: Fair Standing balance comment: BUE support on RW with CGA for steadying; without BUE support, pt noted to reach out for objects for balance with min A for steadying                             Pertinent Vitals/Pain Pain Assessment: Faces Faces Pain Scale: Hurts a little bit Pain Location: abdomen Pain Descriptors / Indicators: Discomfort Pain Intervention(s): Limited activity within patient's tolerance;Monitored during session;Repositioned    Home Living Family/patient expects to be discharged to:: Private residence Living Arrangements: Children Available Help at Discharge: Family;Available 24 hours/day Type of Home: House Home Access: Level entry     Home Layout: One level Home Equipment: Kasandra Knudsen -  single point Additional Comments: Pt reports that he owns no other equipment.    Prior Function Level of Independence: Needs assistance   Gait / Transfers Assistance Needed: ambulates with use of cane with occasional CGA from family when his "legs are shaky";  also reports requiring occasional CGA with transfers     Comments: Reports that this daughter and her husband are home and can assist with anything he needs help with; pt endorses multiple falls this year but unable to report when or how exactly; pt reports that he utilizes a powered cart when available when shopping/in stores     Hand Dominance        Extremity/Trunk Assessment   Upper Extremity Assessment Upper Extremity Assessment: Generalized weakness (bilat grossly 3+ to 4-/5)    Lower Extremity Assessment Lower Extremity Assessment: Generalized weakness (bilat grossly 3+ to 4-/5)       Communication   Communication: No difficulties  Cognition Arousal/Alertness: Awake/alert Behavior During Therapy: WFL for tasks assessed/performed Overall Cognitive Status: Within Functional Limits for tasks assessed                                 General Comments: Pt A&O to self, DOB, location, situation and year however is unaware of day or month      General Comments      Exercises Other Exercises Other Exercises: pt performed bilateral seated marches x 10 and hip ab/add with varying reps; required verbal cues for continued participation and attention to task   Assessment/Plan    PT Assessment Patient needs continued PT services  PT Problem List Decreased strength;Decreased activity tolerance;Decreased balance;Decreased mobility       PT Treatment Interventions DME instruction;Gait training;Functional mobility training;Therapeutic activities;Therapeutic exercise;Balance training;Patient/family education    PT Goals (Current goals can be found in the Care Plan section)  Acute Rehab PT Goals Patient Stated Goal: to be able to eat something PT Goal Formulation: With patient Time For Goal Achievement: 04/14/20 Potential to Achieve Goals: Good    Frequency Min 2X/week   Barriers to discharge        Co-evaluation               AM-PAC PT "6 Clicks"  Mobility  Outcome Measure Help needed turning from your back to your side while in a flat bed without using bedrails?: A Little Help needed moving from lying on your back to sitting on the side of a flat bed without using bedrails?: A Little Help needed moving to and from a bed to a chair (including a wheelchair)?: A Little Help needed standing up from a chair using your arms (e.g., wheelchair or bedside chair)?: A Little Help needed to walk in hospital room?: A Little Help needed climbing 3-5 steps with a railing? : A Little 6 Click Score: 18    End of Session Equipment Utilized During Treatment: Gait belt Activity Tolerance: Patient tolerated treatment well;Patient limited by fatigue Patient left: in chair;with call bell/phone within reach Nurse Communication: Mobility status PT Visit Diagnosis: Unsteadiness on feet (R26.81);Muscle weakness (generalized) (M62.81);History of falling (Z91.81);Dizziness and giddiness (R42)    Time: 1308-6578 PT Time Calculation (min) (ACUTE ONLY): 29 min   Charges:              Vale Haven, SPT  Pate Aylward 03/31/2020, 1:00 PM

## 2020-03-31 NOTE — Progress Notes (Signed)
PROGRESS NOTE    [name]  WCH:[id number] DOB: 1966/04/10 DOA: 03/29/2020 PCP: Patient, No Pcp Per    Brief Narrative:  [name] is a 54 y.o. male with medical history significant of type 2 diabetes mellitus, hyperlipidemia, essential hypertension who presented to the ED with complaints of nausea and vomiting with diffuse abdominal pain.  Patient reports onset of symptoms roughly 2 days ago with also associated poor appetite.  Patient reports he has been out of his insulin for roughly 1 month and finally had it refilled yesterday.  Otherwise reports compliance with his home medications.  No recent sick contacts or changes in dietary habits.  Also reports decreased urine output over the past 1 day.  No other complaints or concerns at this time.  In the ED, Temperature 98.2, HR 140, RR 18, BP 96/67, SPO2 96% on room air. WBC count 15.6, hemoglobin 19.1, platelets 250.  Sodium 137, potassium 4.0, chloride 97, CO2 14, BUN 30, creatinine 1.97.  Glucose 300.  Anion gap 33.  Beta hydroxybutyrate acid 6.65.  Lactic acid 5.7.  Troponin X.  Chest x-ray with no acute cardiopulmonary disease process.  CT abdomen/pelvis with markedly distended urinary bladder with moderate left hydronephrosis/hydroureter and mild right hydronephrosis.  Blood cultures x2: Pending.  Patient was started on insulin drip, Foley catheter placed for urinary retention and patient given ceftriaxone 1 g IV.  ED physician referred patient for admission for further evaluation and treatment for underlying DKA.   Assessment & Plan:   Principal Problem:   DKA (diabetic ketoacidoses) ([place]) Active Problems:   Type 2 diabetes mellitus with hyperlipidemia ([place])   Essential hypertension   AKI (acute kidney injury) ([place])   DKA Hx T2DM Patient presenting with 2-day history of nausea/vomiting with diffuse abdominal pain, poor appetite and decreased urine output.  Complicated by being out of his home insulin over the past month.   Patient was noted to have an elevated glucose of 300 with a lactic acid of 5.7, anion gap of 33.  Beta hydroxybutyrate acid elevated 6.65.  Hemoglobin A1c 7.1. --Insulin drip transition to Lantus 20 units subcutaneously daily  --NovoLog 3 units TID/AC --Insulin sliding scale for further coverage --CBGs before every meal/at bedtime --Diabetic educator following, appreciate assistance  Hypokalemia Potassium 3.3, magnesium 2.3. --Replete potassium today --Electrolytes closely daily  Acute renal failure: Resolved Nausea likely multifactorial with acute urinary retention and significant dehydration as patient in DKA.  CT abdomen/pelvis shows markedly distended urinary bladder with bilateral hydronephrosis.  Creatinine 1.97 on admission, baseline 0.77 and 2019 per review of EMR. --Cr 1.97-->1.63-->1.05>1.08 --Continue treatment with IV insulin as above and IV fluids --Continue to hold home HCTZ/lisinopril --Avoid nephrotoxins, renally dose all medications --Follow renal function closely daily  Acute urinary retention: Resolved Bladder scan ED shows greater than 1000 mL of retained urine.  Foley catheter placed. --Started on tamsulosin 0.4 mg p.o. daily --Foley catheter removed on 03/30/2020 --Her strict I's and O's; especially urinary output  Essential hypertension Patient on lisinopril 10 mg p.o. daily and HCTZ 25 mg p.o. daily.  BP 96/67 on admission. --BP 120/80 this morning --Continue to hold home lisinopril/HCTZ --Started metoprolol tartrate 12.5 mg p.o. twice daily for tachycardia --Labetalol 10 g IV every 4 hours as needed SBP greater than 170 --monitor on telemetry  Hyperlipidemia --hold home Crestor 20 mg p.o. daily for now   DVT prophylaxis: Lovenox Code Status: Full code Family Communication: Updated patient's daughter, Raquel Sarna via telephone this morning  Disposition Plan:  Status is: Inpatient  Remains inpatient appropriate because:Unsafe d/c plan and IV treatments  appropriate due to intensity of illness or inability to take PO continues with electrolyte disturbance and poor appetite with nausea.   Dispo: The patient is from: Home              Anticipated d/c is to: Home              Anticipated d/c date is: 1 day              Patient currently is not medically stable to d/c.   Consultants:   none  Procedures:   Foley 7/21 - 7/22  Antimicrobials:   Ceftriaxone 7/21 - 7/21   Subjective: Patient seen and examined bedside, resting comfortably.  Continues with some nausea and poor appetite.  Also with continued hypokalemia.  Foley catheter removed yesterday and voiding appropriately.  No other complaints or concerns at this time.  Denies headache, no fever/chills/night sweats, no vomiting/diarrhea, no chest pain, no palpitations, no shortness of breath.  No acute events overnight per nursing staff.  Updated patient's daughter, Raquel Sarna via telephone this morning.  Objective: Vitals:   03/30/20 2115 03/30/20 2353 03/31/20 0449 03/31/20 0744  BP: 119/75 (!) 130/82 (!) 129/80 (!) 130/85  Pulse: 97 91 90 89  Resp:  20 18 18   Temp:  98.4 F (36.9 C) 98.4 F (36.9 C) 98.5 F (36.9 C)  TempSrc:      SpO2:  97% 97% 97%  Weight:      Height:        Intake/Output Summary (Last 24 hours) at 03/31/2020 1024 Last data filed at 03/31/2020 1004 Gross per 24 hour  Intake 1881.67 ml  Output 1000 ml  Net 881.67 ml   Filed Weights   03/29/20 1216 03/29/20 2040  Weight: 95.3 kg 97 kg    Examination:  General exam: Appears calm and comfortable, appears older than stated age Respiratory system: Clear to auscultation. Respiratory effort normal. Cardiovascular system: S1 & S2 heard, RRR. No JVD, murmurs, rubs, gallops or clicks. No pedal edema. Gastrointestinal system: Abdomen is nondistended, soft and nontender. No organomegaly or masses felt. Normal bowel sounds heard. Central nervous system: Alert and oriented. No focal neurological  deficits. Extremities: Symmetric 5 x 5 power. Skin: No rashes, lesions or ulcers Psychiatry: Judgement and insight appear poor. Mood & affect appropriate.     Data Reviewed: I have personally reviewed following labs and imaging studies  CBC: Recent Labs  Lab 03/29/20 1231 03/30/20 0346 03/31/20 0630  WBC 15.6* 11.4* 9.2  HGB 19.1* 17.1* 15.8  HCT 53.0* 47.4 42.7  MCV 94.5 92.8 93.2  PLT 250 187 161   Basic Metabolic Panel: Recent Labs  Lab 03/29/20 1231 03/29/20 1231 03/29/20 1544 03/29/20 1958 03/29/20 2353 03/30/20 0346 03/31/20 0630  NA 134*   < > 137 135 137 137 133*  K 4.1   < > 4.0 3.4* 3.6 3.3* 3.3*  CL 91*   < > 97* 97* 101 104 101  CO2 10*   < > 14* 18* 19* 22 15*  GLUCOSE 300*   < > 207* 175* 141* 144* 131*  BUN 29*   < > 30* 27* 24* 22* 27*  CREATININE 1.97*   < > 1.63* 1.36* 1.18 1.05 1.08  CALCIUM 9.1   < > 8.7* 8.6* 8.7* 8.6* 8.4*  MG 2.4  --   --   --   --  2.8* 2.3   < > =  values in this interval not displayed.   GFR: Estimated Creatinine Clearance: 96.1 mL/min (by C-G formula based on SCr of 1.08 mg/dL). Liver Function Tests: Recent Labs  Lab 03/29/20 1231  AST 27  ALT 28  ALKPHOS 105  BILITOT 2.4*  PROT 9.3*  ALBUMIN 5.0   Recent Labs  Lab 03/29/20 1231  LIPASE 48   No results for input(s): AMMONIA in the last 168 hours. Coagulation Profile: No results for input(s): INR, PROTIME in the last 168 hours. Cardiac Enzymes: No results for input(s): CKTOTAL, CKMB, CKMBINDEX, TROPONINI in the last 168 hours. BNP (last 3 results) No results for input(s): PROBNP in the last 8760 hours. HbA1C: Recent Labs    03/29/20 2056  HGBA1C 7.1*   CBG: Recent Labs  Lab 03/30/20 0757 03/30/20 1205 03/30/20 1555 03/30/20 1943 03/31/20 0744  GLUCAP 124* 141* 105* 150* 122*   Lipid Profile: No results for input(s): CHOL, HDL, LDLCALC, TRIG, CHOLHDL, LDLDIRECT in the last 72 hours. Thyroid Function Tests: Recent Labs    03/29/20 2056   TSH 1.946   Anemia Panel: No results for input(s): VITAMINB12, FOLATE, FERRITIN, TIBC, IRON, RETICCTPCT in the last 72 hours. Sepsis Labs: Recent Labs  Lab 03/29/20 1231 03/29/20 1544 03/30/20 0346  LATICACIDVEN 5.7* 5.1* 1.0    Recent Results (from the past 240 hour(s))  Culture, blood (routine x 2)     Status: None (Preliminary result)   Collection Time: 03/29/20 12:31 PM   Specimen: BLOOD  Result Value Ref Range Status   Specimen Description BLOOD RIGHT ANTECUBITAL  Final   Special Requests   Final    BOTTLES DRAWN AEROBIC AND ANAEROBIC Blood Culture adequate volume   Culture   Final    NO GROWTH 2 DAYS Performed at [facility], [address] [place], [place] [address]    Report Status PENDING  Incomplete  Culture, blood (routine x 2)     Status: None (Preliminary result)   Collection Time: 03/29/20  3:44 PM   Specimen: BLOOD  Result Value Ref Range Status   Specimen Description BLOOD BLOOD RIGHT HAND  Final   Special Requests   Final    BOTTLES DRAWN AEROBIC AND ANAEROBIC Blood Culture adequate volume   Culture   Final    NO GROWTH 2 DAYS Performed at [facility], [address] [place], [place] [address]    Report Status PENDING  Incomplete  Urine culture     Status: None   Collection Time: 03/29/20  4:18 PM   Specimen: Urine, Random  Result Value Ref Range Status   Specimen Description   Final    URINE, RANDOM Performed at [facility], [address] [place], [place] [address]    Special Requests   Final    NONE Performed at [facility], [address], [place], [place] [address]    Culture   Final    NO GROWTH Performed at [facility], [place] [address] [place], [place] [address]    Report Status 03/30/2020 FINAL  Final  SARS Coronavirus 2 by RT PCR (hospital order, performed in [facility] hospital lab) Nasopharyngeal Nasopharyngeal Swab     Status: None   Collection Time: 03/29/20  5:31 PM    Specimen: Nasopharyngeal Swab  Result Value Ref Range Status   SARS Coronavirus 2 NEGATIVE NEGATIVE Final    Comment: (NOTE) SARS-CoV-2 target nucleic acids are NOT DETECTED.  The SARS-CoV-2 RNA is generally detectable in upper and lower  respiratory specimens during the acute phase of infection. The lowest concentration of SARS-CoV-2 viral copies this assay can detect is 250 copies / mL. A negative result does not preclude SARS-CoV-2 infection and should not be used as the sole basis for treatment or other patient management decisions.  A negative result may occur with improper specimen collection / handling, submission of specimen other than nasopharyngeal swab, presence of viral mutation(s) within the areas targeted by this assay, and inadequate number of viral copies (<250 copies / mL). A negative result must be combined with clinical observations, patient history, and epidemiological information.  Fact Sheet for Patients:   StrictlyIdeas.no  Fact Sheet for Healthcare Providers: BankingDealers.co.za  This test is not yet approved or  cleared by the [place] FDA and has been authorized for detection and/or diagnosis of SARS-CoV-2 by FDA under an Emergency Use Authorization (EUA).  This EUA will remain in effect (meaning this test can be used) for the duration of the COVID-19 declaration under Section 564(b)(1) of the Act, 21 U.S.C. section 360bbb-3(b)(1), unless the authorization is terminated or revoked sooner.  Performed at [facility], [address] [place], [place] [address]   MRSA PCR Screening     Status: None   Collection Time: 03/29/20  8:46 PM   Specimen: Nasopharyngeal  Result Value Ref Range Status   MRSA by PCR NEGATIVE NEGATIVE Final    Comment:        The GeneXpert MRSA Assay (FDA approved for NASAL specimens only), is one component of a comprehensive MRSA colonization surveillance program. It  is not intended to diagnose MRSA infection nor to guide or monitor treatment for MRSA infections. Performed at [facility], [address], [place], [place] [address]          Radiology Studies: CT Abdomen Pelvis W Contrast  Result Date: 03/29/2020 CLINICAL DATA:  Abdominal pain, acute and not localized. Nausea, vomiting, dizziness for 2 days. History of diabetes and colon cancer. EXAM: CT ABDOMEN AND PELVIS WITH CONTRAST TECHNIQUE: Multidetector CT imaging of the abdomen and pelvis was performed using the standard protocol following bolus administration of intravenous contrast. CONTRAST:  72mL OMNIPAQUE IOHEXOL 300 MG/ML  SOLN COMPARISON:  11/04/2017 FINDINGS: Lower chest: Lung bases are unremarkable. Heart size is normal. There is diffuse thickening of the wall of the LOWER esophagus, increased compared to prior study but raising the question of esophagitis. Hepatobiliary: Status post cholecystectomy.  Liver is unremarkable. Pancreas: Unremarkable. No pancreatic ductal dilatation or surrounding inflammatory changes. Spleen: Normal in size without focal abnormality. Adrenals/Urinary Tract: Adrenal glands are normal. There is moderate LEFT hydronephrosis with tortuous dilated LEFT ureter to the level of the urinary bladder. No obstructing stone identified. Simple cyst in the LOWER pole the LEFT kidney is 3.0 centimeters and stable. There is mild RIGHT hydronephrosis. RIGHT ureter is unremarkable. The urinary bladder is marker markedly distended. No bladder stones identified. Visualized portion of the urethra is unremarkable. Stomach/Bowel: Stomach is unremarkable. Small bowel loops are normal in caliber and wall thickness. Status post RIGHT hemicolectomy. The anastomosis is unremarkable. Distal colon is normal in appearance. Redundant sigmoid colon noted. Vascular/Lymphatic: There is minimal atherosclerotic calcification of the abdominal aorta. No associated aneurysm. No lymphadenopathy.  Reproductive: Few calcifications within the prostate gland. Other: No ascites. Anterior abdominal wall surgical changes. No hernia. Musculoskeletal: Degenerative changes are seen in the lumbar spine. IMPRESSION: 1. Markedly distended urinary bladder. 2. Moderate LEFT hydronephrosis and hydroureter to the level of the urinary bladder. No  obstructing stone identified. Considerations include bladder outlet obstruction and possible LEFT ureteral stricture. 3. Mild RIGHT hydronephrosis without obstructing stone. 4. Thickened distal esophageal wall, raising the question of esophagitis. 5. Status post RIGHT hemicolectomy.  Unremarkable anastomosis. 6. Status post cholecystectomy. 7. Aortic Atherosclerosis (ICD10-I70.0). Electronically Signed   By: Nolon Nations [name].   On: 03/29/2020 15:46   DG Chest Portable 1 View  Result Date: 03/29/2020 CLINICAL DATA:  Chest pain nausea vomiting dizziness EXAM: PORTABLE CHEST 1 VIEW COMPARISON:  12/29/2017 FINDINGS: The heart size and mediastinal contours are within normal limits. Both lungs are clear. The visualized skeletal structures are unremarkable. IMPRESSION: No active disease. Electronically Signed   By: Donavan Foil [name].   On: 03/29/2020 15:09        Scheduled Meds: . Chlorhexidine Gluconate Cloth  6 each Topical Q0600  . enoxaparin (LOVENOX) injection  40 mg Subcutaneous Q24H  . insulin aspart  0-5 Units Subcutaneous QHS  . insulin aspart  0-9 Units Subcutaneous TID WC  . insulin aspart  3 Units Subcutaneous TID WC  . insulin glargine  20 Units Subcutaneous Daily  . metoprolol tartrate  12.5 mg Oral BID  . potassium chloride  30 mEq Oral Q3H  . tamsulosin  0.4 mg Oral Daily   Continuous Infusions:    LOS: 2 days    Time spent: 36 minutes spent on chart review, discussion with nursing staff, consultants, updating family and interview/physical exam; more than 50% of that time was spent in counseling and/or coordination of care.    Arjuna Doeden J  [place] DO [facility] Available via Epic secure chat 7am-7pm After these hours, please refer to coverage provider listed on [email or web address] 03/31/2020, 10:24 AM

## 2020-04-01 LAB — BASIC METABOLIC PANEL
Anion gap: 15 (ref 5–15)
BUN: 24 mg/dL — ABNORMAL HIGH (ref 6–20)
CO2: 17 mmol/L — ABNORMAL LOW (ref 22–32)
Calcium: 8.3 mg/dL — ABNORMAL LOW (ref 8.9–10.3)
Chloride: 101 mmol/L (ref 98–111)
Creatinine, Ser: 0.87 mg/dL (ref 0.61–1.24)
GFR calc Af Amer: >60 mL/min (ref >60)
GFR calc non Af Amer: >60 mL/min (ref >60)
Glucose, Bld: 100 mg/dL — ABNORMAL HIGH (ref 70–99)
Potassium: 3.2 mmol/L — ABNORMAL LOW (ref 3.5–5.1)
Sodium: 133 mmol/L — ABNORMAL LOW (ref 135–145)

## 2020-04-01 LAB — GLUCOSE, CAPILLARY
Glucose-Capillary: 92 mg/dL (ref 70–99)
Glucose-Capillary: 98 mg/dL (ref 70–99)

## 2020-04-01 LAB — MAGNESIUM: Magnesium: 2.1 mg/dL (ref 1.7–2.4)

## 2020-04-01 MED ORDER — TRESIBA FLEXTOUCH 200 UNIT/ML ~~LOC~~ SOPN: 40.0000 [IU] | PEN_INJECTOR | Freq: Every morning | SUBCUTANEOUS | 0 refills | Status: AC

## 2020-04-01 MED ORDER — "PEN NEEDLES 3/16"" 31G X 5 MM MISC": 0 refills | Status: AC

## 2020-04-01 MED ORDER — LISINOPRIL 10 MG PO TABS: 10.0000 mg | ORAL_TABLET | Freq: Every day | ORAL | 0 refills | Status: AC

## 2020-04-01 MED ORDER — POTASSIUM CHLORIDE CRYS ER 20 MEQ PO TBCR
40.0000 meq | EXTENDED_RELEASE_TABLET | ORAL | Status: AC
  Administered 2020-04-01 (×2): 40 meq via ORAL
  Filled 2020-04-01: qty 2

## 2020-04-01 MED ORDER — EMPAGLIFLOZIN 25 MG PO TABS: 25.0000 mg | ORAL_TABLET | Freq: Every day | ORAL | 0 refills | Status: AC

## 2020-04-01 MED ORDER — METOPROLOL TARTRATE 25 MG PO TABS: 12.5000 mg | ORAL_TABLET | Freq: Two times a day (BID) | ORAL | 0 refills | Status: AC

## 2020-04-01 MED ORDER — POTASSIUM CHLORIDE ER 10 MEQ PO CPCR: 10.0000 meq | ORAL_CAPSULE | Freq: Two times a day (BID) | ORAL | 0 refills | Status: AC

## 2020-04-01 MED ORDER — ROSUVASTATIN CALCIUM 20 MG PO TABS: 20.0000 mg | ORAL_TABLET | Freq: Every day | ORAL | 0 refills | Status: AC

## 2020-04-01 NOTE — Plan of Care (Signed)
  Problem: Coping: Goal: Level of anxiety will decrease 04/01/2020 1212 by Lorn Junes, RN Outcome: Adequate for Discharge 04/01/2020 1205 by Lorn Junes, RN Outcome: Adequate for Discharge   Problem: Pain Managment: Goal: General experience of comfort will improve 04/01/2020 1212 by Lorn Junes, RN Outcome: Adequate for Discharge 04/01/2020 1205 by Lorn Junes, RN Outcome: Adequate for Discharge   Problem: Safety: Goal: Ability to remain free from injury will improve 04/01/2020 1212 by Lorn Junes, RN Outcome: Adequate for Discharge 04/01/2020 1205 by Lorn Junes, RN Outcome: Adequate for Discharge   Problem: Skin Integrity: Goal: Risk for impaired skin integrity will decrease 04/01/2020 1212 by Lorn Junes, RN Outcome: Adequate for Discharge 04/01/2020 1205 by Lorn Junes, RN Outcome: Adequate for Discharge

## 2020-04-01 NOTE — Progress Notes (Signed)
1200-Patient notified daughter of discharge, awaiting her arrival.

## 2020-04-01 NOTE — Plan of Care (Signed)
  Problem: Education: Goal: Knowledge of General Education information will improve Description: Including pain rating scale, medication(s)/side effects and non-pharmacologic comfort measures Outcome: Adequate for Discharge   Problem: Health Behavior/Discharge Planning: Goal: Ability to manage health-related needs will improve Outcome: Adequate for Discharge   Problem: Clinical Measurements: Goal: Ability to maintain clinical measurements within normal limits will improve Outcome: Adequate for Discharge Goal: Will remain free from infection Outcome: Adequate for Discharge Goal: Diagnostic test results will improve Outcome: Adequate for Discharge Goal: Respiratory complications will improve Outcome: Adequate for Discharge Goal: Cardiovascular complication will be avoided Outcome: Adequate for Discharge   Problem: Activity: Goal: Risk for activity intolerance will decrease Outcome: Adequate for Discharge   Problem: Nutrition: Goal: Adequate nutrition will be maintained Outcome: Adequate for Discharge   Problem: Coping: Goal: Level of anxiety will decrease Outcome: Adequate for Discharge   Problem: Elimination: Goal: Will not experience complications related to bowel motility Outcome: Adequate for Discharge Goal: Will not experience complications related to urinary retention Outcome: Adequate for Discharge   Problem: Pain Managment: Goal: General experience of comfort will improve Outcome: Adequate for Discharge   Problem: Pain Managment: Goal: General experience of comfort will improve Outcome: Adequate for Discharge   Problem: Pain Managment: Goal: General experience of comfort will improve Outcome: Adequate for Discharge   Problem: Safety: Goal: Ability to remain free from injury will improve Outcome: Adequate for Discharge   Problem: Skin Integrity: Goal: Risk for impaired skin integrity will decrease Outcome: Adequate for Discharge

## 2020-04-01 NOTE — Discharge Summary (Signed)
Physician Discharge Summary  [name] LHT:[id number] DOB: [birth year] DOA: 03/29/2020  PCP: Patient, No Pcp Per  Admit date: 03/29/2020 Discharge date: 04/01/2020  Admitted From: Home Disposition: Home  Recommendations for Outpatient Follow-up:  1. Follow up with PCP in 1-2 weeks 2. Please obtain BMP in one week 3. Discontinued home HCTZ/linsinopril combination 4. Started on lisinopril 10 mg p.o. daily and metoprolol 12.5 mg p.o. twice daily 5. Refilled home Tresiba 40 units approximately daily and [place]: Physical therapy Equipment/Devices: 3 1 bedside commode, walker  Discharge Condition: Stable CODE STATUS: Full code Diet recommendation: Heart healthy/consistent carbohydrate diet  History of present illness:  [name] is a 54 y.o.malewith medical history significant oftype 2 diabetes mellitus, hyperlipidemia, essential hypertension who presented to the ED with complaints of nausea and vomiting with diffuse abdominal pain. Patient reports onset of symptoms roughly 2 days ago with also associated poor appetite. Patient reports he has been out of his insulin for roughly 1 month and finally had it refilled yesterday. Otherwise reports compliance with his home medications. No recent sick contacts or changes in dietary habits. Also reports decreased urine output over the past 1 day. No other complaints or concerns at this time.  In the ED, Temperature 98.2, HR 140, RR 18, BP 96/67, SPO2 96% on room air. WBC count 15.6, hemoglobin 19.1, platelets 250. Sodium 137, potassium 4.0, chloride 97, CO2 14, BUN 30, creatinine 1.97. Glucose 300. Anion gap 33. Beta hydroxybutyrate acid 6.65. Lactic acid 5.7. Troponin X. Chest x-ray with no acute cardiopulmonary disease process. CT abdomen/pelvis with markedly distended urinary bladder with moderate left hydronephrosis/hydroureter and mild right hydronephrosis. Blood cultures x2: Pending. Patient was started on  insulin drip, Foley catheter placed for urinary retention and patient given ceftriaxone 1 g IV. ED physician referred patient for admission for further evaluation and treatment for underlying DKA.  Hospital course:  DKA Hx T2DM Patient presenting with 2-day history of nausea/vomiting with diffuse abdominal pain, poor appetite and decreased urine output. Complicated by being out of his home insulin over the past month. Patient was noted to have an elevated glucose of 300 with a lactic acid of 5.7, anion gap of 33. Beta hydroxybutyrate acid elevated 6.65.  Hemoglobin A1c 7.1.  Patient was initially started on insulin drip and eventually transitioned to subcutaneous Lantus.  Patient was seen by diabetic educator.  Patient will discharge back on his home Jardiance and [place] 40 units of tensely daily, medications refilled at time of discharge.  Recommend follow-up with PCP in 1-2 weeks for hospitalization for further management of his diabetes.  Patient encouraged to ensure that he does not run out of any of his home medications in the future.  Hypokalemia Repleted during hospitalization.  Continue home potassium supplementation 10 mEq twice daily.  Acute renal failure: Resolved Nausea likely multifactorial with acute urinary retention and significant dehydration as patient in DKA. CT abdomen/pelvis shows markedly distended urinary bladder with bilateral hydronephrosis. Creatinine 1.97 on admission, baseline 0.77 and 2019 per review of EMR.  Patient was treated with IV insulin and IV fluid hydration and his home HCTZ and lisinopril were held.  Creatinine improved to 0.87 at time of discharge.  Will discontinue home HCTZ.  Continue lisinopril 10 mg p.o. daily.  Recommend repeat BMP in 1 week.  Acute urinary retention: Resolved Bladder scan ED shows greater than 1000 mL of retained urine. Foley catheter placed initially.  Etiology likely secondary to acute illness.  Foley catheter  was removed the  following day without issue and patient able to void on his own.  Essential hypertension Patient on lisinopril 10 mg p.o. daily and HCTZ 25 mg p.o. daily. BP 96/67 on admission.  Patient's home antihypertensives were initially held.  During the course of hospitalization, his blood pressure improved and will restart antihypertensive regimen at home with lisinopril 10 mg p.o. daily and metoprolol 12.5 mg p.o. twice daily.  Discontinue HCTZ.  Follow-up with PCP for further guidance.  Hyperlipidemia Continue home Crestor 20 mg p.o. daily   Discharge Diagnoses:  Active Problems:   Type 2 diabetes mellitus with hyperlipidemia [facility])   Essential hypertension    Discharge Instructions  Discharge Instructions    Call MD for:  difficulty breathing, headache or visual disturbances   Complete by: As directed    Call MD for:  extreme fatigue   Complete by: As directed    Call MD for:  persistant dizziness or light-headedness   Complete by: As directed    Call MD for:  persistant nausea and vomiting   Complete by: As directed    Call MD for:  severe uncontrolled pain   Complete by: As directed    Call MD for:  temperature >100.4   Complete by: As directed    Diet - low sodium heart healthy   Complete by: As directed    Increase activity slowly   Complete by: As directed      Allergies as of 04/01/2020   No Known Allergies     Medication List    STOP taking these medications   hydrochlorothiazide 12.5 MG capsule Commonly known as: MICROZIDE   lisinopril-hydrochlorothiazide 10-12.5 MG tablet Commonly known as: ZESTORETIC     TAKE these medications   empagliflozin 25 MG Tabs tablet Commonly known as: JARDIANCE Take 1 tablet (25 mg total) by mouth daily.   lisinopril 10 MG tablet Commonly known as: ZESTRIL Take 1 tablet (10 mg total) by mouth daily.   metoprolol tartrate 25 MG tablet Commonly known as: LOPRESSOR Take 0.5 tablets (12.5 mg total) by mouth 2 (two) times  daily.   naproxen 500 MG tablet Commonly known as: NAPROSYN Take 500 mg by mouth 2 (two) times daily with a meal.   Pen Needles 3/16" 31G X 5 MM Misc Use as directed with insulin pen   potassium chloride 10 MEQ CR capsule Commonly known as: MICRO-K Take 1 capsule (10 mEq total) by mouth 2 (two) times daily.   rosuvastatin 20 MG tablet Commonly known as: CRESTOR Take 1 tablet (20 mg total) by mouth daily.   Tyler Aas FlexTouch 200 UNIT/ML FlexTouch Pen Generic drug: insulin degludec Inject 40 Units into the skin in the morning.            Durable Medical Equipment  (From admission, onward)         Start     Ordered   03/31/20 1626  For home use only DME 3 n 1  Once        03/31/20 1626   03/31/20 1626  For home use only DME Walker rolling  Once       Question Answer Comment  Walker: With 5 Inch Wheels   Patient needs a walker to treat with the following condition Gait abnormality      03/31/20 1626          Follow-up Information    [place],NP. Schedule an appointment as soon as possible for a visit in 1 week(s).  No Known Allergies  Consultations:  None   Procedures/Studies: CT Abdomen Pelvis W Contrast  Result Date: 03/29/2020 CLINICAL DATA:  Abdominal pain, acute and not localized. Nausea, vomiting, dizziness for 2 days. History of diabetes and colon cancer. EXAM: CT ABDOMEN AND PELVIS WITH CONTRAST TECHNIQUE: Multidetector CT imaging of the abdomen and pelvis was performed using the standard protocol following bolus administration of intravenous contrast. CONTRAST:  46mL OMNIPAQUE IOHEXOL 300 MG/ML  SOLN COMPARISON:  11/04/2017 FINDINGS: Lower chest: Lung bases are unremarkable. Heart size is normal. There is diffuse thickening of the wall of the LOWER esophagus, increased compared to prior study but raising the question of esophagitis. Hepatobiliary: Status post cholecystectomy.  Liver is unremarkable. Pancreas: Unremarkable. No pancreatic  ductal dilatation or surrounding inflammatory changes. Spleen: Normal in size without focal abnormality. Adrenals/Urinary Tract: Adrenal glands are normal. There is moderate LEFT hydronephrosis with tortuous dilated LEFT ureter to the level of the urinary bladder. No obstructing stone identified. Simple cyst in the LOWER pole the LEFT kidney is 3.0 centimeters and stable. There is mild RIGHT hydronephrosis. RIGHT ureter is unremarkable. The urinary bladder is marker markedly distended. No bladder stones identified. Visualized portion of the urethra is unremarkable. Stomach/Bowel: Stomach is unremarkable. Small bowel loops are normal in caliber and wall thickness. Status post RIGHT hemicolectomy. The anastomosis is unremarkable. Distal colon is normal in appearance. Redundant sigmoid colon noted. Vascular/Lymphatic: There is minimal atherosclerotic calcification of the abdominal aorta. No associated aneurysm. No lymphadenopathy. Reproductive: Few calcifications within the prostate gland. Other: No ascites. Anterior abdominal wall surgical changes. No hernia. Musculoskeletal: Degenerative changes are seen in the lumbar spine. IMPRESSION: 1. Markedly distended urinary bladder. 2. Moderate LEFT hydronephrosis and hydroureter to the level of the urinary bladder. No obstructing stone identified. Considerations include bladder outlet obstruction and possible LEFT ureteral stricture. 3. Mild RIGHT hydronephrosis without obstructing stone. 4. Thickened distal esophageal wall, raising the question of esophagitis. 5. Status post RIGHT hemicolectomy.  Unremarkable anastomosis. 6. Status post cholecystectomy. 7. Aortic Atherosclerosis (ICD10-I70.0). Electronically Signed   By: Nolon Nations [name].   On: 03/29/2020 15:46   DG Chest Portable 1 View  Result Date: 03/29/2020 CLINICAL DATA:  Chest pain nausea vomiting dizziness EXAM: PORTABLE CHEST 1 VIEW COMPARISON:  12/29/2017 FINDINGS: The heart size and mediastinal contours  are within normal limits. Both lungs are clear. The visualized skeletal structures are unremarkable. IMPRESSION: No active disease. Electronically Signed   By: Donavan Foil [name].   On: 03/29/2020 15:09      Subjective: Patient seen and examined at bedside, resting comfortably.  States ready for discharge home.  Glucose currently well controlled.  Discussed with patient needs for compliance with his home medication regimen and to follow-up closely with his PCP.  Patient requesting refill on all his medications.  No other complaints or concerns at this time.  Denies headache, no fever/chills/night sweats, no nausea/vomiting/diarrhea, no chest pain, palpitations, no abdominal pain, no cough/congestion, no weakness, no fatigue, no paresthesias.  No acute events overnight per nursing staff.  Discharge Exam: Vitals:   04/01/20 0349 04/01/20 0734  BP: (!) 130/80 (!) 114/61  Pulse: 86 92  Resp: 16 (!) 24  Temp: 98.3 F (36.8 C) 98.3 F (36.8 C)  SpO2: 97% 96%   Vitals:   03/31/20 1441 03/31/20 1955 04/01/20 0349 04/01/20 0734  BP: (!) 140/85 114/75 (!) 130/80 (!) 114/61  Pulse: 92 88 86 92  Resp: 17 16 16  (!) 24  Temp: 98.2 F (36.8 C) 98.7  F (37.1 C) 98.3 F (36.8 C) 98.3 F (36.8 C)  TempSrc:  Oral Oral Oral  SpO2: 96% 97% 97% 96%  Weight:      Height:        General: Pt is alert, awake, not in acute distress Cardiovascular: RRR, S1/S2 +, no rubs, no gallops Respiratory: CTA bilaterally, no wheezing, no rhonchi Abdominal: Soft, NT, ND, bowel sounds + Extremities: no edema, no cyanosis    The results of significant diagnostics from this hospitalization (including imaging, microbiology, ancillary and laboratory) are listed below for reference.     Microbiology: Recent Results (from the past 240 hour(s))  Culture, blood (routine x 2)     Status: None (Preliminary result)   Collection Time: 03/29/20 12:31 PM   Specimen: BLOOD  Result Value Ref Range Status   Specimen  Description BLOOD RIGHT ANTECUBITAL  Final   Special Requests   Final    BOTTLES DRAWN AEROBIC AND ANAEROBIC Blood Culture adequate volume   Culture   Final    NO GROWTH 3 DAYS Performed at [facility], [address], [place], [place] [address]    Report Status PENDING  Incomplete  Culture, blood (routine x 2)     Status: None (Preliminary result)   Collection Time: 03/29/20  3:44 PM   Specimen: BLOOD  Result Value Ref Range Status   Specimen Description BLOOD BLOOD RIGHT HAND  Final   Special Requests   Final    BOTTLES DRAWN AEROBIC AND ANAEROBIC Blood Culture adequate volume   Culture   Final    NO GROWTH 3 DAYS Performed at [facility], [address] [place], [place] [address]    Report Status PENDING  Incomplete  Urine culture     Status: None   Collection Time: 03/29/20  4:18 PM   Specimen: Urine, Random  Result Value Ref Range Status   Specimen Description   Final    URINE, RANDOM Performed at [facility], [address] [place], [place] [address]    Special Requests   Final    NONE Performed at [facility], [address] [place], [place] [address]    Culture   Final    NO GROWTH Performed at [facility], [place] [address], [place],  [address]    Report Status 03/30/2020 FINAL  Final  SARS Coronavirus 2 by RT PCR (hospital order, performed in [facility] hospital lab) Nasopharyngeal Nasopharyngeal Swab     Status: None   Collection Time: 03/29/20  5:31 PM   Specimen: Nasopharyngeal Swab  Result Value Ref Range Status   SARS Coronavirus 2 NEGATIVE NEGATIVE Final    Comment: (NOTE) SARS-CoV-2 target nucleic acids are NOT DETECTED.  The SARS-CoV-2 RNA is generally detectable in upper and lower respiratory specimens during the acute phase of infection. The lowest concentration of SARS-CoV-2 viral copies this assay can detect is 250 copies / mL. A negative result does not preclude SARS-CoV-2 infection and  should not be used as the sole basis for treatment or other patient management decisions.  A negative result may occur with improper specimen collection / handling, submission of specimen other than nasopharyngeal swab, presence of viral mutation(s) within the areas targeted by this assay, and inadequate number of viral copies (<250 copies / mL). A negative result must be combined with clinical observations, patient history, and epidemiological information.  Fact Sheet for Patients:   StrictlyIdeas.no  Fact Sheet for Healthcare Providers: BankingDealers.co.za  This test  is not yet approved or  cleared by the [place] and has been authorized for detection and/or diagnosis of SARS-CoV-2 by FDA under an Emergency Use Authorization (EUA).  This EUA will remain in effect (meaning this test can be used) for the duration of the COVID-19 declaration under Section 564(b)(1) of the Act, 21 U.S.C. section 360bbb-3(b)(1), unless the authorization is terminated or revoked sooner.  Performed at [facility], [place] [address] [place], [place] [address]   MRSA PCR Screening     Status: None   Collection Time: 03/29/20  8:46 PM   Specimen: Nasopharyngeal  Result Value Ref Range Status   MRSA by PCR NEGATIVE NEGATIVE Final    Comment:        The GeneXpert MRSA Assay (FDA approved for NASAL specimens only), is one component of a comprehensive MRSA colonization surveillance program. It is not intended to diagnose MRSA infection nor to guide or monitor treatment for MRSA infections. Performed at [facility], [address] [place], [place] [address]      Labs: BNP (last 3 results) No results for input(s): BNP in the last 8760 hours. Basic Metabolic Panel: Recent Labs  Lab 03/29/20 1231 03/29/20 1544 03/29/20 1958 03/29/20 2353 03/30/20 0346 03/31/20 0630 04/01/20 0310  NA 134*   < > 135 137 137 133* 133*   K 4.1   < > 3.4* 3.6 3.3* 3.3* 3.2*  CL 91*   < > 97* 101 104 101 101  CO2 10*   < > 18* 19* 22 15* 17*  GLUCOSE 300*   < > 175* 141* 144* 131* 100*  BUN 29*   < > 27* 24* 22* 27* 24*  CREATININE 1.97*   < > 1.36* 1.18 1.05 1.08 0.87  CALCIUM 9.1   < > 8.6* 8.7* 8.6* 8.4* 8.3*  MG 2.4  --   --   --  2.8* 2.3 2.1   < > = values in this interval not displayed.   Liver Function Tests: Recent Labs  Lab 03/29/20 1231  AST 27  ALT 28  ALKPHOS 105  BILITOT 2.4*  PROT 9.3*  ALBUMIN 5.0   Recent Labs  Lab 03/29/20 1231  LIPASE 48   No results for input(s): AMMONIA in the last 168 hours. CBC: Recent Labs  Lab 03/29/20 1231 03/30/20 0346 03/31/20 0630  WBC 15.6* 11.4* 9.2  HGB 19.1* 17.1* 15.8  HCT 53.0* 47.4 42.7  MCV 94.5 92.8 93.2  PLT 250 187 156   Cardiac Enzymes: No results for input(s): CKTOTAL, CKMB, CKMBINDEX, TROPONINI in the last 168 hours. BNP: Invalid input(s): POCBNP CBG: Recent Labs  Lab 03/30/20 1943 03/31/20 0744 03/31/20 1705 03/31/20 2109 04/01/20 0732  GLUCAP 150* 122* 168* 92 92   D-Dimer No results for input(s): DDIMER in the last 72 hours. Hgb A1c Recent Labs    03/29/20 2056  HGBA1C 7.1*   Lipid Profile No results for input(s): CHOL, HDL, LDLCALC, TRIG, CHOLHDL, LDLDIRECT in the last 72 hours. Thyroid function studies Recent Labs    03/29/20 2056  TSH 1.946   Anemia work up No results for input(s): VITAMINB12, FOLATE, FERRITIN, TIBC, IRON, RETICCTPCT in the last 72 hours. Urinalysis    Component Value Date/Time   COLORURINE YELLOW (A) 03/29/2020 1218   APPEARANCEUR CLEAR (A) 03/29/2020 1218   LABSPEC 1.025 03/29/2020 1218   PHURINE 5.0 03/29/2020 1218   GLUCOSEU >=500 (A) 03/29/2020 1218   HGBUR NEGATIVE 03/29/2020 1218   HGBUR negative  09/07/2009 [place] 03/29/2020 1218   KETONESUR 80 (A) 03/29/2020 1218   PROTEINUR NEGATIVE 03/29/2020 1218   UROBILINOGEN 0.2 09/07/2009 0822   NITRITE NEGATIVE  03/29/2020 [place] 03/29/2020 1218   Sepsis Labs Invalid input(s): PROCALCITONIN,  WBC,  LACTICIDVEN Microbiology Recent Results (from the past 240 hour(s))  Culture, blood (routine x 2)     Status: None (Preliminary result)   Collection Time: 03/29/20 12:31 PM   Specimen: BLOOD  Result Value Ref Range Status   Specimen Description BLOOD RIGHT ANTECUBITAL  Final   Special Requests   Final    BOTTLES DRAWN AEROBIC AND ANAEROBIC Blood Culture adequate volume   Culture   Final    NO GROWTH 3 DAYS Performed at [facility], [address] [place], [place] [address]    Report Status PENDING  Incomplete  Culture, blood (routine x 2)     Status: None (Preliminary result)   Collection Time: 03/29/20  3:44 PM   Specimen: BLOOD  Result Value Ref Range Status   Specimen Description BLOOD BLOOD RIGHT HAND  Final   Special Requests   Final    BOTTLES DRAWN AEROBIC AND ANAEROBIC Blood Culture adequate volume   Culture   Final    NO GROWTH 3 DAYS Performed at [facility], [address], [place], [place] [address]    Report Status PENDING  Incomplete  Urine culture     Status: None   Collection Time: 03/29/20  4:18 PM   Specimen: Urine, Random  Result Value Ref Range Status   Specimen Description   Final    URINE, RANDOM Performed at [facility], [address] [place], [place] [address]    Special Requests   Final    NONE Performed at [facility], [address], [place], [place] [address]    Culture   Final    NO GROWTH Performed at [facility], [place] [address] [place], [place] [address]    Report Status 03/30/2020 FINAL  Final  SARS Coronavirus 2 by RT PCR (hospital order, performed in [facility] hospital lab) Nasopharyngeal Nasopharyngeal Swab     Status: None   Collection Time: 03/29/20  5:31 PM   Specimen: Nasopharyngeal Swab  Result Value Ref Range Status   SARS Coronavirus 2 NEGATIVE NEGATIVE Final     Comment: (NOTE) SARS-CoV-2 target nucleic acids are NOT DETECTED.  The SARS-CoV-2 RNA is generally detectable in upper and lower respiratory specimens during the acute phase of infection. The lowest concentration of SARS-CoV-2 viral copies this assay can detect is 250 copies / mL. A negative result does not preclude SARS-CoV-2 infection and should not be used as the sole basis for treatment or other patient management decisions.  A negative result may occur with improper specimen collection / handling, submission of specimen other than nasopharyngeal swab, presence of viral mutation(s) within the areas targeted by this assay, and inadequate number of viral copies (<250 copies / mL). A negative result must be combined with clinical observations, patient history, and epidemiological information.  Fact Sheet for Patients:   StrictlyIdeas.no  Fact Sheet for Healthcare Providers: BankingDealers.co.za  This test is not yet approved or  cleared by the [place] FDA and has been authorized for detection and/or diagnosis of SARS-CoV-2 by FDA under an Emergency Use Authorization (EUA).  This EUA will remain in effect (meaning this test can be used) for the duration of the COVID-19 declaration  under Section 564(b)(1) of the Act, 21 U.S.C. section 360bbb-3(b)(1), unless the authorization is terminated or revoked sooner.  Performed at [facility], [address] [place], [place] [address]   MRSA PCR Screening     Status: None   Collection Time: 03/29/20  8:46 PM   Specimen: Nasopharyngeal  Result Value Ref Range Status   MRSA by PCR NEGATIVE NEGATIVE Final    Comment:        The GeneXpert MRSA Assay (FDA approved for NASAL specimens only), is one component of a comprehensive MRSA colonization surveillance program. It is not intended to diagnose MRSA infection nor to guide or monitor treatment for MRSA  infections. Performed at [facility], [address] [place], [place] [address]      Time coordinating discharge: Over 30 minutes  SIGNED:   Chibueze Beasley J [place] DO  [facility] 04/01/2020, 11:01 AM

## 2020-04-01 NOTE — TOC Transition Note (Signed)
Transition of Care [facility]) - CM/SW Discharge Note   Patient Details  Name: [name] MRN: [id number] Date of Birth: February 19, 1966  Transition of Care [facility]) CM/SW Contact:  Boris Sharper, LCSW Phone Number: 04/01/2020, 12:44 PM   Clinical Narrative:    Pt medically stable for discharge per MD. Pt will be transported home by his daughter. CSW contacted Corene Cornea with [facility] and he was able to accept the referral for [facility] PT and RN. CSW made daughter aware that pat had to have PCP in order to receive [facility] services she stated that she would call for a follow up on Monday.     Final next level of care: [place] Barriers to Discharge: No Barriers Identified   Patient Goals and CMS Choice Patient states their goals for this hospitalization and ongoing recovery are:: to go home CMS Medicare.gov Compare Post Acute Care list provided to:: Patient Choice offered to / list presented to : Patient  Discharge Placement                  Name of family member notified: Raquel Sarna Patient and family notified of of transfer: 04/01/20  Discharge Plan and Services                          HH Arranged: PT, RN [facility] Agency: [place] ([place]) Date HH Agency Contacted: 04/01/20 Time [place]: 1243 Representative spoke with at [place]: [place] (SD[facility]) Interventions     Readmission Risk Interventions No flowsheet data found.

## 2020-04-03 LAB — CULTURE, BLOOD (ROUTINE X 2)
Culture: NO GROWTH
Culture: NO GROWTH
Special Requests: ADEQUATE
Special Requests: ADEQUATE

## 2020-05-16 ENCOUNTER — Ambulatory Visit: Payer: Medicare Other | Admitting: Family Medicine

## 2021-06-09 DEATH — deceased
